# Patient Record
Sex: Male | Born: 1945
Health system: Southern US, Community
[De-identification: ages and names within clinical notes are randomized; demographics above are authoritative.]

## PROBLEM LIST (undated history)

## (undated) DIAGNOSIS — E079 Disorder of thyroid, unspecified: Secondary | ICD-10-CM

## (undated) DIAGNOSIS — E039 Hypothyroidism, unspecified: Secondary | ICD-10-CM

## (undated) DIAGNOSIS — M1990 Unspecified osteoarthritis, unspecified site: Secondary | ICD-10-CM

## (undated) DIAGNOSIS — M419 Scoliosis, unspecified: Secondary | ICD-10-CM

## (undated) DIAGNOSIS — C801 Malignant (primary) neoplasm, unspecified: Secondary | ICD-10-CM

## (undated) DIAGNOSIS — E785 Hyperlipidemia, unspecified: Secondary | ICD-10-CM

## (undated) DIAGNOSIS — K5792 Diverticulitis of intestine, part unspecified, without perforation or abscess without bleeding: Secondary | ICD-10-CM

## (undated) DIAGNOSIS — F32A Depression, unspecified: Secondary | ICD-10-CM

## (undated) DIAGNOSIS — I10 Essential (primary) hypertension: Secondary | ICD-10-CM

## (undated) HISTORY — DX: Malignant (primary) neoplasm, unspecified: C80.1

## (undated) HISTORY — PX: APPENDECTOMY: SHX54

## (undated) HISTORY — PX: PAROTID GLAND TUMOR EXCISION: SHX5221

## (undated) HISTORY — PX: SALIVARY GLAND SURGERY: SHX768

## (undated) HISTORY — PX: SKIN CANCER EXCISION: SHX779

## (undated) HISTORY — PX: ROOT CANAL: SHX2363

## (undated) HISTORY — PX: COLONOSCOPY: SHX174

## (undated) HISTORY — PX: CATARACT EXTRACTION W/ INTRAOCULAR LENS IMPLANT: SHX1309

## (undated) HISTORY — PX: HERNIA REPAIR: SHX51

## (undated) HISTORY — PX: KNEE SURGERY: SHX244

## (undated) HISTORY — PX: KNEE ARTHROSCOPY: SUR90

## (undated) HISTORY — PX: EYE SURGERY: SHX253

## (undated) HISTORY — PX: ROTATOR CUFF REPAIR: SHX139

---

## 1998-06-29 ENCOUNTER — Ambulatory Visit (HOSPITAL_BASED_OUTPATIENT_CLINIC_OR_DEPARTMENT_OTHER): Admission: RE | Admit: 1998-06-29 | Discharge: 1998-06-29 | Payer: Self-pay | Admitting: Orthopedic Surgery

## 2002-10-05 ENCOUNTER — Ambulatory Visit (HOSPITAL_COMMUNITY): Admission: RE | Admit: 2002-10-05 | Discharge: 2002-10-05 | Payer: Self-pay | Admitting: Gastroenterology

## 2004-01-17 ENCOUNTER — Ambulatory Visit (HOSPITAL_COMMUNITY): Admission: RE | Admit: 2004-01-17 | Discharge: 2004-01-17 | Payer: Self-pay | Admitting: General Surgery

## 2004-01-17 ENCOUNTER — Encounter (INDEPENDENT_AMBULATORY_CARE_PROVIDER_SITE_OTHER): Payer: Self-pay | Admitting: General Surgery

## 2004-01-17 ENCOUNTER — Ambulatory Visit (HOSPITAL_BASED_OUTPATIENT_CLINIC_OR_DEPARTMENT_OTHER): Admission: RE | Admit: 2004-01-17 | Discharge: 2004-01-17 | Payer: Self-pay | Admitting: General Surgery

## 2004-07-09 ENCOUNTER — Ambulatory Visit (HOSPITAL_COMMUNITY): Admission: RE | Admit: 2004-07-09 | Discharge: 2004-07-09 | Payer: Self-pay | Admitting: Cardiology

## 2004-07-10 ENCOUNTER — Ambulatory Visit (HOSPITAL_COMMUNITY): Admission: RE | Admit: 2004-07-10 | Discharge: 2004-07-10 | Payer: Self-pay | Admitting: Cardiology

## 2004-11-09 ENCOUNTER — Encounter: Admission: RE | Admit: 2004-11-09 | Discharge: 2004-11-09 | Payer: Self-pay | Admitting: Family Medicine

## 2005-08-22 ENCOUNTER — Encounter: Admission: RE | Admit: 2005-08-22 | Discharge: 2005-08-22 | Payer: Self-pay | Admitting: Gastroenterology

## 2007-12-19 ENCOUNTER — Encounter: Admission: RE | Admit: 2007-12-19 | Discharge: 2007-12-19 | Payer: Self-pay | Admitting: Neurosurgery

## 2008-10-14 ENCOUNTER — Encounter: Admission: RE | Admit: 2008-10-14 | Discharge: 2008-10-14 | Payer: Self-pay | Admitting: Internal Medicine

## 2010-07-23 ENCOUNTER — Other Ambulatory Visit: Payer: Self-pay | Admitting: Internal Medicine

## 2010-07-23 DIAGNOSIS — R945 Abnormal results of liver function studies: Secondary | ICD-10-CM

## 2010-07-30 ENCOUNTER — Ambulatory Visit
Admission: RE | Admit: 2010-07-30 | Discharge: 2010-07-30 | Disposition: A | Discharge status: Home / Self Care | Payer: Medicare Other | Source: Ambulatory Visit | Attending: Internal Medicine | Admitting: Internal Medicine

## 2010-07-30 DIAGNOSIS — R945 Abnormal results of liver function studies: Secondary | ICD-10-CM

## 2010-08-02 ENCOUNTER — Other Ambulatory Visit: Payer: Self-pay | Admitting: Dermatology

## 2010-10-26 NOTE — Op Note (Signed)
NAME:  Jake Collins, Jake Collins                         ACCOUNT NO.:  [id number]   MEDICAL RECORD NO.:  [id number]                   PATIENT TYPE:  AMB   LOCATION:  DSC                                  FACILITY:  [facility]   PHYSICIAN:  Adolph Pollack, M.D.            DATE OF BIRTH:  08/19/1945   DATE OF PROCEDURE:  01/17/2004  DATE OF DISCHARGE:                                 OPERATIVE REPORT   PREOPERATIVE DIAGNOSIS:  Thin melanoma of the back.   POSTOPERATIVE DIAGNOSIS:  Thin melanoma of the back.   PROCEDURE:  Wide local excision of melanoma of the back.   SURGEON:  Adolph Pollack, M.D.   ANESTHESIA:  Mixture of lidocaine and Marcaine.   INDICATIONS FOR PROCEDURE:  Mr. Sagona is a 65 year old male who had a  number of moles, one was on his back and was a thin melanoma.  He now  presents for wide local excision of that area.  The procedure and risks were  discussed with him preoperatively.   SURGICAL TECHNIQUE:  He was placed prone on the minor room procedure table.  The scar from his biopsy site was identified.  1 cm was marked from the  radial edges of the scar.  The hair was shaved and the area was sterilely  prepped and draped.  Local anesthetic was infiltrated around the area  superficially and deep and then an elliptical incision was made through the  full thickness skin into the subcutaneous tissue down to the fascial layer  and the area was excised.  It was sent to pathology.  Bleeding points were  controlled with electrocautery.  Once hemostasis was adequate, I then closed  the wound primarily with full thickness interrupted 3-0 nylon sutures.  Antibiotic ointment and a sterile dressing were applied.  He tolerated the  procedure well without any apparent complication and was sent home in  satisfactory condition.  He was given some Vicodin for pain should he need  it and told to call the office and get an appointment to see me in two  weeks.                        Adolph Pollack, M.D.    Kari Baars  D:  01/17/2004  T:  01/17/2004  Job:  [id number]

## 2011-05-28 ENCOUNTER — Emergency Department (HOSPITAL_COMMUNITY): Payer: Medicare Other | Admitting: Anesthesiology

## 2011-05-28 ENCOUNTER — Other Ambulatory Visit (INDEPENDENT_AMBULATORY_CARE_PROVIDER_SITE_OTHER): Payer: Self-pay | Admitting: General Surgery

## 2011-05-28 ENCOUNTER — Encounter: Payer: Self-pay | Admitting: Emergency Medicine

## 2011-05-28 ENCOUNTER — Encounter (HOSPITAL_COMMUNITY): Payer: Self-pay | Admitting: Anesthesiology

## 2011-05-28 ENCOUNTER — Encounter (HOSPITAL_COMMUNITY): Admission: EM | Disposition: A | Discharge status: Home / Self Care | Payer: Self-pay | Source: Home / Self Care

## 2011-05-28 ENCOUNTER — Emergency Department (INDEPENDENT_AMBULATORY_CARE_PROVIDER_SITE_OTHER): Payer: Medicare Other

## 2011-05-28 ENCOUNTER — Inpatient Hospital Stay (HOSPITAL_BASED_OUTPATIENT_CLINIC_OR_DEPARTMENT_OTHER)
Admission: EM | Admit: 2011-05-28 | Discharge: 2011-06-02 | DRG: 339 | Disposition: A | Discharge status: Home / Self Care | Payer: Medicare Other | Attending: General Surgery | Admitting: General Surgery

## 2011-05-28 DIAGNOSIS — E039 Hypothyroidism, unspecified: Secondary | ICD-10-CM

## 2011-05-28 DIAGNOSIS — K35209 Acute appendicitis with generalized peritonitis, without abscess, unspecified as to perforation: Principal | ICD-10-CM | POA: Diagnosis present

## 2011-05-28 DIAGNOSIS — I10 Essential (primary) hypertension: Secondary | ICD-10-CM | POA: Diagnosis present

## 2011-05-28 DIAGNOSIS — F3289 Other specified depressive episodes: Secondary | ICD-10-CM | POA: Diagnosis present

## 2011-05-28 DIAGNOSIS — K560 Paralytic ileus: Secondary | ICD-10-CM | POA: Diagnosis not present

## 2011-05-28 DIAGNOSIS — R112 Nausea with vomiting, unspecified: Secondary | ICD-10-CM

## 2011-05-28 DIAGNOSIS — K3580 Unspecified acute appendicitis: Secondary | ICD-10-CM

## 2011-05-28 DIAGNOSIS — E785 Hyperlipidemia, unspecified: Secondary | ICD-10-CM

## 2011-05-28 DIAGNOSIS — H409 Unspecified glaucoma: Secondary | ICD-10-CM

## 2011-05-28 DIAGNOSIS — K5730 Diverticulosis of large intestine without perforation or abscess without bleeding: Secondary | ICD-10-CM | POA: Diagnosis present

## 2011-05-28 DIAGNOSIS — K352 Acute appendicitis with generalized peritonitis, without abscess: Principal | ICD-10-CM | POA: Diagnosis present

## 2011-05-28 DIAGNOSIS — R109 Unspecified abdominal pain: Secondary | ICD-10-CM

## 2011-05-28 DIAGNOSIS — R509 Fever, unspecified: Secondary | ICD-10-CM

## 2011-05-28 DIAGNOSIS — F329 Major depressive disorder, single episode, unspecified: Secondary | ICD-10-CM | POA: Diagnosis present

## 2011-05-28 DIAGNOSIS — F32A Depression, unspecified: Secondary | ICD-10-CM

## 2011-05-28 HISTORY — DX: Disorder of thyroid, unspecified: E07.9

## 2011-05-28 HISTORY — DX: Essential (primary) hypertension: I10

## 2011-05-28 HISTORY — DX: Hypothyroidism, unspecified: E03.9

## 2011-05-28 HISTORY — DX: Diverticulitis of intestine, part unspecified, without perforation or abscess without bleeding: K57.92

## 2011-05-28 HISTORY — PX: LAPAROSCOPIC APPENDECTOMY: SHX408

## 2011-05-28 HISTORY — DX: Hyperlipidemia, unspecified: E78.5

## 2011-05-28 LAB — CBC
HCT: 45.7 % (ref 39.0–52.0)
Hemoglobin: 15.8 g/dL (ref 13.0–17.0)
MCH: 30.6 pg (ref 26.0–34.0)
MCHC: 34.6 g/dL (ref 30.0–36.0)
MCV: 88.4 fL (ref 78.0–100.0)
RDW: 13.1 % (ref 11.5–15.5)

## 2011-05-28 LAB — DIFFERENTIAL
Basophils Absolute: 0 10*3/uL (ref 0.0–0.1)
Basophils Relative: 0 % (ref 0–1)
Eosinophils Absolute: 0.1 10*3/uL (ref 0.0–0.7)
Eosinophils Relative: 0 % (ref 0–5)
Monocytes Absolute: 0.7 10*3/uL (ref 0.1–1.0)
Monocytes Relative: 4 % (ref 3–12)
Neutro Abs: 13.5 10*3/uL — ABNORMAL HIGH (ref 1.7–7.7)

## 2011-05-28 LAB — URINALYSIS, ROUTINE W REFLEX MICROSCOPIC
Bilirubin Urine: NEGATIVE
Glucose, UA: NEGATIVE mg/dL
Ketones, ur: NEGATIVE mg/dL
Leukocytes, UA: NEGATIVE
Protein, ur: NEGATIVE mg/dL
pH: 6.5 (ref 5.0–8.0)

## 2011-05-28 LAB — BASIC METABOLIC PANEL
BUN: 19 mg/dL (ref 6–23)
Calcium: 10.2 mg/dL (ref 8.4–10.5)
Chloride: 100 mEq/L (ref 96–112)
Creatinine, Ser: 1.2 mg/dL (ref 0.50–1.35)
GFR calc Af Amer: 71 mL/min — ABNORMAL LOW (ref >90)

## 2011-05-28 LAB — URINE MICROSCOPIC-ADD ON

## 2011-05-28 SURGERY — APPENDECTOMY, LAPAROSCOPIC
Anesthesia: General | Site: Abdomen | Wound class: Dirty or Infected

## 2011-05-28 MED ORDER — PROMETHAZINE HCL 25 MG/ML IJ SOLN: 6.2500 mg | INTRAMUSCULAR | Status: DC | PRN

## 2011-05-28 MED ORDER — MORPHINE SULFATE 4 MG/ML IJ SOLN
4.0000 mg | Freq: Once | INTRAMUSCULAR | Status: AC
  Administered 2011-05-28: 4 mg via INTRAVENOUS
  Filled 2011-05-28: qty 1

## 2011-05-28 MED ORDER — MORPHINE SULFATE 2 MG/ML IJ SOLN
2.0000 mg | INTRAMUSCULAR | Status: DC | PRN
  Administered 2011-05-30 – 2011-06-01 (×4): 2 mg via INTRAVENOUS
  Filled 2011-05-28 (×5): qty 1

## 2011-05-28 MED ORDER — LACTATED RINGERS IR SOLN
Status: DC | PRN
  Administered 2011-05-28: 2000 mL

## 2011-05-28 MED ORDER — GLYCOPYRROLATE 0.2 MG/ML IJ SOLN
INTRAMUSCULAR | Status: DC | PRN
  Administered 2011-05-28: .6 mg via INTRAVENOUS

## 2011-05-28 MED ORDER — ONDANSETRON HCL 4 MG/2ML IJ SOLN
4.0000 mg | Freq: Once | INTRAMUSCULAR | Status: AC
  Administered 2011-05-28: 4 mg via INTRAVENOUS
  Filled 2011-05-28: qty 2

## 2011-05-28 MED ORDER — LACTATED RINGERS IV SOLN: INTRAVENOUS | Status: DC

## 2011-05-28 MED ORDER — BUPIVACAINE-EPINEPHRINE 0.25% -1:200000 IJ SOLN
INTRAMUSCULAR | Status: AC
  Filled 2011-05-28: qty 1

## 2011-05-28 MED ORDER — ROCURONIUM BROMIDE 100 MG/10ML IV SOLN
INTRAVENOUS | Status: DC | PRN
  Administered 2011-05-28: 28 mg via INTRAVENOUS
  Administered 2011-05-28: 2 mg via INTRAVENOUS
  Administered 2011-05-28 (×2): 10 mg via INTRAVENOUS

## 2011-05-28 MED ORDER — FENTANYL CITRATE 0.05 MG/ML IJ SOLN
INTRAMUSCULAR | Status: DC | PRN
  Administered 2011-05-28 (×2): 100 ug via INTRAVENOUS
  Administered 2011-05-28: 50 ug via INTRAVENOUS
  Administered 2011-05-28: 100 ug via INTRAVENOUS

## 2011-05-28 MED ORDER — SODIUM CHLORIDE 0.9 % IR SOLN
Status: DC | PRN
  Administered 2011-05-28: 1000 mL

## 2011-05-28 MED ORDER — IOHEXOL 300 MG/ML  SOLN
100.0000 mL | Freq: Once | INTRAMUSCULAR | Status: AC | PRN
  Administered 2011-05-28: 100 mL via INTRAVENOUS

## 2011-05-28 MED ORDER — KCL IN DEXTROSE-NACL 20-5-0.9 MEQ/L-%-% IV SOLN
INTRAVENOUS | Status: DC
  Administered 2011-05-29 – 2011-06-01 (×8): via INTRAVENOUS
  Filled 2011-05-28 (×14): qty 1000

## 2011-05-28 MED ORDER — LIDOCAINE HCL (CARDIAC) 20 MG/ML IV SOLN
INTRAVENOUS | Status: DC | PRN
  Administered 2011-05-28: 80 mg via INTRAVENOUS

## 2011-05-28 MED ORDER — PANTOPRAZOLE SODIUM 40 MG IV SOLR
40.0000 mg | Freq: Every day | INTRAVENOUS | Status: DC
  Administered 2011-05-29 – 2011-05-30 (×3): 40 mg via INTRAVENOUS
  Filled 2011-05-28 (×4): qty 40

## 2011-05-28 MED ORDER — ONDANSETRON HCL 4 MG/2ML IJ SOLN: 4.0000 mg | Freq: Four times a day (QID) | INTRAMUSCULAR | Status: DC | PRN

## 2011-05-28 MED ORDER — SODIUM CHLORIDE 0.9 % IV SOLN
1.0000 g | INTRAVENOUS | Status: DC
  Administered 2011-05-29 – 2011-05-31 (×3): 1 g via INTRAVENOUS
  Filled 2011-05-28 (×4): qty 1

## 2011-05-28 MED ORDER — PROPOFOL 10 MG/ML IV BOLUS
INTRAVENOUS | Status: DC | PRN
  Administered 2011-05-28: 160 mg via INTRAVENOUS

## 2011-05-28 MED ORDER — ONDANSETRON HCL 4 MG PO TABS: 4.0000 mg | ORAL_TABLET | Freq: Four times a day (QID) | ORAL | Status: DC | PRN

## 2011-05-28 MED ORDER — HEPARIN SODIUM (PORCINE) 5000 UNIT/ML IJ SOLN
5000.0000 [IU] | Freq: Three times a day (TID) | INTRAMUSCULAR | Status: DC
  Administered 2011-05-29 – 2011-06-02 (×13): 5000 [IU] via SUBCUTANEOUS
  Filled 2011-05-28 (×16): qty 1

## 2011-05-28 MED ORDER — FENTANYL CITRATE 0.05 MG/ML IJ SOLN: 25.0000 ug | INTRAMUSCULAR | Status: DC | PRN

## 2011-05-28 MED ORDER — ONDANSETRON HCL 4 MG/2ML IJ SOLN
INTRAMUSCULAR | Status: DC | PRN
  Administered 2011-05-28: 4 mg via INTRAVENOUS

## 2011-05-28 MED ORDER — SODIUM CHLORIDE 0.9 % IV SOLN
1.0000 g | Freq: Once | INTRAVENOUS | Status: AC
  Administered 2011-05-28: 1 g via INTRAVENOUS
  Filled 2011-05-28: qty 1

## 2011-05-28 MED ORDER — SUCCINYLCHOLINE CHLORIDE 20 MG/ML IJ SOLN
INTRAMUSCULAR | Status: DC | PRN
  Administered 2011-05-28: 100 mg via INTRAVENOUS

## 2011-05-28 MED ORDER — BUPIVACAINE-EPINEPHRINE 0.5% -1:200000 IJ SOLN
INTRAMUSCULAR | Status: DC | PRN
  Administered 2011-05-28: 20 mL

## 2011-05-28 MED ORDER — NEOSTIGMINE METHYLSULFATE 1 MG/ML IJ SOLN
INTRAMUSCULAR | Status: DC | PRN
  Administered 2011-05-28: 4 mg via INTRAVENOUS

## 2011-05-28 MED ORDER — LEVOTHYROXINE SODIUM 100 MCG IV SOLR
50.0000 ug | Freq: Every day | INTRAVENOUS | Status: DC
  Administered 2011-05-29 – 2011-05-31 (×3): 50 ug via INTRAVENOUS
  Filled 2011-05-28 (×3): qty 2.5

## 2011-05-28 MED ORDER — LACTATED RINGERS IV SOLN
INTRAVENOUS | Status: DC | PRN
  Administered 2011-05-28 (×2): via INTRAVENOUS

## 2011-05-28 MED ORDER — IOHEXOL 300 MG/ML  SOLN
18.0000 mL | Freq: Once | INTRAMUSCULAR | Status: AC | PRN
  Administered 2011-05-28: 18 mL via ORAL

## 2011-05-28 SURGICAL SUPPLY — 50 items
ADH SKN CLS APL DERMABOND .7 (GAUZE/BANDAGES/DRESSINGS)
APPLIER CLIP 5 13 M/L LIGAMAX5 (MISCELLANEOUS)
APPLIER CLIP ROT 10 11.4 M/L (STAPLE)
APR CLP MED LRG 11.4X10 (STAPLE)
APR CLP MED LRG 5 ANG JAW (MISCELLANEOUS)
BAG SPEC RTRVL LRG 6X4 10 (ENDOMECHANICALS) ×1
CANISTER SUCTION 2500CC (MISCELLANEOUS) ×2 IMPLANT
CHLORAPREP W/TINT 26ML (MISCELLANEOUS) ×2 IMPLANT
CLIP APPLIE 5 13 M/L LIGAMAX5 (MISCELLANEOUS) IMPLANT
CLIP APPLIE ROT 10 11.4 M/L (STAPLE) ×1 IMPLANT
CLOTH BEACON ORANGE TIMEOUT ST (SAFETY) ×2 IMPLANT
CUTTER FLEX LINEAR 45M (STAPLE) ×1 IMPLANT
DECANTER SPIKE VIAL GLASS SM (MISCELLANEOUS) ×2 IMPLANT
DERMABOND ADVANCED (GAUZE/BANDAGES/DRESSINGS)
DERMABOND ADVANCED .7 DNX12 (GAUZE/BANDAGES/DRESSINGS) IMPLANT
DRAPE LAPAROSCOPIC ABDOMINAL (DRAPES) ×2 IMPLANT
ELECT REM PT RETURN 9FT ADLT (ELECTROSURGICAL) ×2
ELECTRODE REM PT RTRN 9FT ADLT (ELECTROSURGICAL) ×1 IMPLANT
GLOVE BIOGEL PI IND STRL 7.0 (GLOVE) ×1 IMPLANT
GLOVE BIOGEL PI INDICATOR 7.0 (GLOVE)
GLOVE SS BIOGEL STRL SZ 7.5 (GLOVE) ×1 IMPLANT
GLOVE SUPERSENSE BIOGEL SZ 7.5 (GLOVE) ×1
GLOVE SURG SS PI 6.5 STRL IVOR (GLOVE) ×1 IMPLANT
GLOVE SURG SS PI 8.0 STRL IVOR (GLOVE) ×1 IMPLANT
GLOVE SURG SS PI 8.5 STRL IVOR (GLOVE) ×1
GLOVE SURG SS PI 8.5 STRL STRW (GLOVE) IMPLANT
GOWN STRL NON-REIN LRG LVL3 (GOWN DISPOSABLE) ×2 IMPLANT
GOWN STRL REIN XL XLG (GOWN DISPOSABLE) ×4 IMPLANT
IV LACTATED RINGERS 1000ML (IV SOLUTION) ×2 IMPLANT
KIT BASIN OR (CUSTOM PROCEDURE TRAY) ×2 IMPLANT
NS IRRIG 1000ML POUR BTL (IV SOLUTION) ×2 IMPLANT
PENCIL BUTTON HOLSTER BLD 10FT (ELECTRODE) ×1 IMPLANT
POUCH SPECIMEN RETRIEVAL 10MM (ENDOMECHANICALS) ×2 IMPLANT
RELOAD 45 VASCULAR/THIN (ENDOMECHANICALS) IMPLANT
RELOAD STAPLE 45 2.5 WHT GRN (ENDOMECHANICALS) IMPLANT
RELOAD STAPLE 45 3.5 BLU ETS (ENDOMECHANICALS) IMPLANT
RELOAD STAPLE TA45 3.5 REG BLU (ENDOMECHANICALS) ×2 IMPLANT
SCALPEL HARMONIC ACE (MISCELLANEOUS) ×2 IMPLANT
SET IRRIG TUBING LAPAROSCOPIC (IRRIGATION / IRRIGATOR) ×2 IMPLANT
SLEEVE Z-THREAD 5X100MM (TROCAR) IMPLANT
SOLUTION ANTI FOG 6CC (MISCELLANEOUS) ×2 IMPLANT
STRIP CLOSURE SKIN 1/2X4 (GAUZE/BANDAGES/DRESSINGS) ×1 IMPLANT
SUT MNCRL AB 4-0 PS2 18 (SUTURE) ×2 IMPLANT
TOWEL OR 17X26 10 PK STRL BLUE (TOWEL DISPOSABLE) ×2 IMPLANT
TRAY FOLEY CATH 14FRSI W/METER (CATHETERS) ×2 IMPLANT
TRAY LAP CHOLE (CUSTOM PROCEDURE TRAY) ×2 IMPLANT
TROCAR XCEL BLUNT TIP 100MML (ENDOMECHANICALS) ×2 IMPLANT
TROCAR Z-THREAD FIOS 12X100MM (TROCAR) ×2 IMPLANT
TROCAR Z-THREAD FIOS 5X100MM (TROCAR) ×2 IMPLANT
TUBING INSUFFLATION 10FT LAP (TUBING) ×2 IMPLANT

## 2011-05-28 NOTE — ED Notes (Signed)
Pt transported to OR

## 2011-05-28 NOTE — Transfer of Care (Signed)
Immediate Anesthesia Transfer of Care Note  Patient: Jake Collins  Procedure(s) Performed:  APPENDECTOMY LAPAROSCOPIC  Patient Location: PACU  Anesthesia Type: General  Level of Consciousness: awake, alert  and oriented  Airway & Oxygen Therapy: Patient Spontanous Breathing and Patient connected to face mask oxygen  Post-op Assessment: Report given to PACU RN and Post -op Vital signs reviewed and stable  Post vital signs: Reviewed and stable  Complications: No apparent anesthesia complications

## 2011-05-28 NOTE — ED Notes (Signed)
RLQ abdominal pain since Sunday night.  Some fever, N/V, no D.  Pt sent here by MD to be evaluated.

## 2011-05-28 NOTE — Op Note (Signed)
Preoperative Diagnosis: Acute appendicitis  Postoprative Diagnosis: acute appendicitis with gangrene and perforation  Procedure: Procedure(s): APPENDECTOMY LAPAROSCOPIC   Surgeon: Glenna Fellows T   Assistants: none  Anesthesia:  General endotracheal anesthesiaDiagnos  Indications: patient is a 65 year old male who presents with approximately 2 days of persistent and worsening right lower quadrant abdominal pain, nausea, fever. He has exquisite tenderness in the right lower quadrant and CT scan has shown evidence of acute appendicitis. I recommend proceeding with laparoscopic appendectomy and possible open appendectomy. We discussed the nature of the procedure and its indications, risks of bleeding, infection, possible open procedure, and anesthetic complications  Procedure Detail: the patient was brought to the operating room placed in the supine position and general endotracheal anesthesia induced. Foley catheter was placed. He received preoperative IV antibiotics.PAS were placed. The abdomen was widely sterilely prepped and draped.  Patient time out was performed and correct procedure verified. Access was obtained with a 1 cm infraumbilical incision with an open Hassan technique without difficulty and pneumoperitoneum established. Under direct vision a 5 mm trocar was placed in the right upper quadrant and a 12 mm trocar in the left lower quadrant. There were noted to be adherent loops of small bowel with exudate against the anterior abdominal wall. These were carefully taken down with gentle blunt dissection. There was a fairly widespread inflammatory process in the right lower quadrant. Loops of small bowel that were adherent with exudate were gently separated. The small bowel was followed up to the cecum where there was intense inflammation around the tip of the cecum. With careful blunt dissection the appendix was mobilized from just beneath the cecum and was severely acutely inflamed with  gangrenous changes the tip and probable microperforation although there was no fecal contamination. The cecum was freed from its lateral peritoneal attachments. The mesial appendix was very thick and inflamed but I was able to carefully come through the mesoappendix with the Harmonic scalpel working down toward the base of the appendix. The appendix was inflamed right down toward the tip of the cecum in the mesial appendix was completely taken off the appendix and the appendix was completely mobilized down to the tip of the cecum. This was just adjacent to the ileocecal valve but I was careful to avoid narrowing this. There was a relatively normal appendix right at the tip of the cecum and I came across the tip of the cecum with a 45 mm blue load stapler. The specimen was placed in an Endo Catch bag and brought out through the umbilical incision. The abdomen was then thoroughly irrigated with several liters of saline and all interloop inflammatory adhesions completely broken up.  The staple line was intact. There was no bleeding. All CO2 was evacuated. The mattress suture was secured at the umbilical incision. The skin incisions were closed with subcutaneous 2-0 Monocryl and Dermabond. The patient was taken to recovery room in good condition. Sponge needle and instrument counts were correct.   Findings: Severe gangrenous appendicitis with probable microperforation and peritonitis in the right lower quadrant.  Estimated Blood Loss:  Minimal         Drains: none  Blood Given: none          Specimens: appendix        Complications:  * No complications entered in OR log *         Disposition: PACU - hemodynamically stable.         Condition: stable  Mariella Saa MD, FACS  05/28/2011, 10:46 PM

## 2011-05-28 NOTE — ED Notes (Signed)
MD at bedside. Surgeon at bedside.  

## 2011-05-28 NOTE — H&P (Signed)
Jake Collins is an 65 y.o. male.   Chief Complaint: abdominal pain HPI: patient is a 65 year old male who approximately 48 hours ago developed the fairly rapid onset of right lower quadrant abdominal pain. This was persistent and somewhat worse with movement. The following day now 24 hours ago the pain was definitely worse. He was nauseated but no vomiting. He had some diarrhea. He attempted to contact his primary but it got late in the day and he declined to go to the emergency room. The pain persisted today and he presented to the emergency room. He has localized pain in the right lower quadrant which is persistent and unchanged. He has lack of appetite today. No vomiting. Denies fever or chills. He has no history of similar problems although has been treated as an outpatient for diverticulitis in the past. His colonoscopy was done about 2 years ago.  Past Medical History  Diagnosis Date  . Thyroid disease   . Hypothyroid   . Hyperlipemia   . Hypertension   . Diverticulitis     2 bouts in 10 years    Past Surgical History  Procedure Date  . Hernia repair   . Skin cancer excision     on his back and was benign  . Salivary gland surgery     L side  . Rotator cuff repair     Left side  . Knee arthroscopy     Left    History reviewed. No pertinent family history. Social History:  reports that he has never smoked. He does not have any smokeless tobacco history on file. He reports that he does not drink alcohol. His drug history not on file.  Allergies:  Allergies  Allergen Reactions  . Sulfa Antibiotics     Trudie Buckler syndrome     Medications Prior to Admission  Medication Dose Route Frequency Provider Last Rate Last Dose  . ertapenem (INVANZ) 1 g in sodium chloride 0.9 % 50 mL IVPB  1 g Intravenous Once Geoffery Lyons, MD   1 g at 05/28/11 1835  . iohexol (OMNIPAQUE) 300 MG/ML solution 100 mL  100 mL Intravenous Once PRN Medication Radiologist   100 mL at 05/28/11 1810  .  iohexol (OMNIPAQUE) 300 MG/ML solution 18 mL  18 mL Oral Once PRN Medication Radiologist   18 mL at 05/28/11 1715  . morphine 4 MG/ML injection 4 mg  4 mg Intravenous Once Geoffery Lyons, MD   4 mg at 05/28/11 1717  . morphine 4 MG/ML injection 4 mg  4 mg Intravenous Once Geoffery Lyons, MD   4 mg at 05/28/11 1842  . ondansetron (ZOFRAN) injection 4 mg  4 mg Intravenous Once Geoffery Lyons, MD   4 mg at 05/28/11 1749   No current outpatient prescriptions on file as of 05/28/2011.    Results for orders placed during the hospital encounter of 05/28/11 (from the past 48 hour(s))  URINALYSIS, ROUTINE W REFLEX MICROSCOPIC     Status: Abnormal   Collection Time   05/28/11  4:35 PM      Component Value Range Comment   Color, Urine YELLOW  YELLOW     APPearance CLEAR  CLEAR     Specific Gravity, Urine 1.014  1.005 - 1.030     pH 6.5  5.0 - 8.0     Glucose, UA NEGATIVE  NEGATIVE (mg/dL)    Hgb urine dipstick SMALL (*) NEGATIVE     Bilirubin Urine NEGATIVE  NEGATIVE  Ketones, ur NEGATIVE  NEGATIVE (mg/dL)    Protein, ur NEGATIVE  NEGATIVE (mg/dL)    Urobilinogen, UA 1.0  0.0 - 1.0 (mg/dL)    Nitrite NEGATIVE  NEGATIVE     Leukocytes, UA NEGATIVE  NEGATIVE    URINE MICROSCOPIC-ADD ON     Status: Normal   Collection Time   05/28/11  4:35 PM      Component Value Range Comment   Squamous Epithelial / LPF RARE  RARE     RBC / HPF 3-6  <3 (RBC/hpf)    Bacteria, UA RARE  RARE    CBC     Status: Abnormal   Collection Time   05/28/11  4:50 PM      Component Value Range Comment   WBC 15.6 (*) 4.0 - 10.5 (K/uL)    RBC 5.17  4.22 - 5.81 (MIL/uL)    Hemoglobin 15.8  13.0 - 17.0 (g/dL)    HCT 16.1  09.6 - 04.5 (%)    MCV 88.4  78.0 - 100.0 (fL)    MCH 30.6  26.0 - 34.0 (pg)    MCHC 34.6  30.0 - 36.0 (g/dL)    RDW 40.9  81.1 - 91.4 (%)    Platelets 189  150 - 400 (K/uL)   DIFFERENTIAL     Status: Abnormal   Collection Time   05/28/11  4:50 PM      Component Value Range Comment   Neutrophils  Relative 86 (*) 43 - 77 (%)    Neutro Abs 13.5 (*) 1.7 - 7.7 (K/uL)    Lymphocytes Relative 9 (*) 12 - 46 (%)    Lymphs Abs 1.4  0.7 - 4.0 (K/uL)    Monocytes Relative 4  3 - 12 (%)    Monocytes Absolute 0.7  0.1 - 1.0 (K/uL)    Eosinophils Relative 0  0 - 5 (%)    Eosinophils Absolute 0.1  0.0 - 0.7 (K/uL)    Basophils Relative 0  0 - 1 (%)    Basophils Absolute 0.0  0.0 - 0.1 (K/uL)   BASIC METABOLIC PANEL     Status: Abnormal   Collection Time   05/28/11  4:50 PM      Component Value Range Comment   Sodium 140  135 - 145 (mEq/L)    Potassium 3.7  3.5 - 5.1 (mEq/L)    Chloride 100  96 - 112 (mEq/L)    CO2 29  19 - 32 (mEq/L)    Glucose, Bld 109 (*) 70 - 99 (mg/dL)    BUN 19  6 - 23 (mg/dL)    Creatinine, Ser 7.82  0.50 - 1.35 (mg/dL)    Calcium 95.6  8.4 - 10.5 (mg/dL)    GFR calc non Af Amer 61 (*) >90 (mL/min)    GFR calc Af Amer 71 (*) >90 (mL/min)    Ct Abdomen Pelvis W Contrast  05/28/2011  *RADIOLOGY REPORT*  Clinical Data: Abdominal pain, fever, nausea  CT ABDOMEN AND PELVIS WITH CONTRAST  Technique:  Multidetector CT imaging of the abdomen and pelvis was performed following the standard protocol during bolus administration of intravenous contrast.  Contrast: 18mL OMNIPAQUE IOHEXOL 300 MG/ML IV SOLN, OMNIPAQUE IOHEXOL 300 MG/ML IV SOLN  Comparison: None.  Findings: Mild dependent atelectasis in the bilateral lung bases.  Liver, spleen, pancreas, and adrenal glands are within normal limits.  Gallbladder is unremarkable.  No intrahepatic or extrahepatic ductal dilatation.  Kidneys are unremarkable.  No hydronephrosis.  No evidence of bowel obstruction. Colonic diverticulosis, without associated inflammatory changes.  Inflammatory changes/mucosal hyperenhancement involving the appendix (series 2/image 56), compatible with acute appendicitis. No drainable fluid collection or abscess.  No free air.  Atherosclerotic calcifications of the abdominal aorta and branch vessels.  No  abdominopelvic ascites.  No suspicious abdominopelvic lymphadenopathy.  Prostate is unremarkable.  Bladder is within normal limits.  Bilateral fat containing inguinal hernias.  Degenerative changes of the visualized thoracolumbar spine.  IMPRESSION: Acute appendicitis.  No drainable fluid collection or abscess.  No free air.  Critical Value/emergent results were called by telephone at the time of interpretation on 05/28/2011  at 1815 hours  to  Dr. Geoffery Lyons, who verbally acknowledged these results.  Original Report Authenticated By: Charline Bills, M.D.    Review of Systems  Constitutional: Negative for fever, chills and weight loss.  HENT: Negative.   Respiratory: Negative for cough and shortness of breath.   Cardiovascular: Negative for chest pain and palpitations.  Gastrointestinal: Positive for nausea and abdominal pain. Negative for vomiting, diarrhea, constipation, blood in stool and melena.  Genitourinary: Negative.   Skin: Negative for rash.    Blood pressure 126/66, pulse 89, temperature 98 F (36.7 C), temperature source Oral, resp. rate 16, SpO2 96.00%. Physical Exam  General: Alert and well-developed white male in no distress Skin: Warm and dry without rash or infection HEENT: There is a well-healed incision over the left parotid. No palpable masses. Oropharynx clear. Sclera nonicteric. Pupils equal round and reactive. Lymph nodes: No cervical, supraclavicular, or inguinal nodes palpable Cardiovascular: Regular rate and rhythm without murmur. No JVD or edema. Lungs: Clear without wheezing or increased work of breathing Abdomen: Nondistended. Hypoactive bowel sounds. There is marked tenderness in the right lower quadrant with guarding and local peritoneal signs. No discernible masses or organomegaly. The remainder of the abdomen is mildly tender and there is referred tenderness. Extremities: No joint swelling no edema or cyanosis Neurologic: He is alert and fully oriented.  Affect normal. Motor and sensory exams grossly normal. Assessment/Plan Acute appendicitis. His clinical presentation is typical in this has been confirmed by CT scan. He has already received broad-spectrum IV antibiotics. The patient is to be taken to the operating room for emergency appendectomy. He'll be approached laparoscopically. We discussed the nature of the procedure, indications, and risks of anesthetic complications, heart or lung problems, bleeding, infection, and possible need for open procedure. He understands and agrees.  Mackinley Cassaday T 05/28/2011, 8:18 PM

## 2011-05-28 NOTE — Anesthesia Postprocedure Evaluation (Signed)
Anesthesia Post Note  Patient: Jake Collins  Procedure(s) Performed:  APPENDECTOMY LAPAROSCOPIC  Anesthesia type: General  Patient location: PACU  Post pain: Pain level controlled  Post assessment: Post-op Vital signs reviewed  Last Vitals:  Filed Vitals:   05/28/11 2245  BP: 134/67  Pulse:   Temp:   Resp:     Post vital signs: Reviewed  Level of consciousness: sedated  Complications: No apparent anesthesia complications

## 2011-05-28 NOTE — Anesthesia Preprocedure Evaluation (Signed)
Anesthesia Evaluation  Patient identified by MRN, date of birth, ID band Patient awake    Reviewed: Allergy & Precautions, H&P , NPO status , Patient's Chart, lab work & pertinent test results  Airway Mallampati: II TM Distance: >3 FB Neck ROM: full    Dental No notable dental hx. (+) Teeth Intact and Dental Advidsory Given   Pulmonary neg pulmonary ROS,  clear to auscultation  Pulmonary exam normal       Cardiovascular Exercise Tolerance: Good hypertension, On Medications regular Normal    Neuro/Psych Negative Neurological ROS  Negative Psych ROS   GI/Hepatic negative GI ROS, Neg liver ROS,   Endo/Other  Negative Endocrine ROSHypothyroid  Renal/GU negative Renal ROS  Genitourinary negative   Musculoskeletal   Abdominal   Peds  Hematology negative hematology ROS (+)   Anesthesia Other Findings   Reproductive/Obstetrics negative OB ROS                           Anesthesia Physical Anesthesia Plan  ASA: II and Emergent  Anesthesia Plan: General and Rapid Sequence   Post-op Pain Management:    Induction:   Airway Management Planned:   Additional Equipment:   Intra-op Plan:   Post-operative Plan:   Informed Consent: I have reviewed the patients History and Physical, chart, labs and discussed the procedure including the risks, benefits and alternatives for the proposed anesthesia with the patient or authorized representative who has indicated his/her understanding and acceptance.   Dental Advisory Given  Plan Discussed with: CRNA  Anesthesia Plan Comments:         Anesthesia Quick Evaluation

## 2011-05-28 NOTE — ED Notes (Signed)
Last Morphine 4mg  @ 1842. Invanz 1gm @ [id number].

## 2011-05-28 NOTE — ED Notes (Signed)
Via carelink--spoke with Terri for general surgery at [facility]

## 2011-05-28 NOTE — ED Provider Notes (Addendum)
History     CSN: [id number] Arrival date & time: 05/28/2011  4:30 PM   First MD Initiated Contact with Patient 05/28/11 1655      Chief Complaint  Patient presents with  . Abdominal Pain  . Fever  . Nausea  . Emesis    (Consider location/radiation/quality/duration/timing/severity/associated sxs/prior treatment) HPI Comments: Started Sunday night with pain in rlq, worse the past two days.  No urinary or bowel complaints.    Patient is a 65 y.o. male presenting with abdominal pain, fever, and vomiting. The history is provided by the patient.  Abdominal Pain The primary symptoms of the illness include abdominal pain, fever and vomiting. The current episode started yesterday. The onset of the illness was gradual. The problem has been gradually worsening.  The patient has not had a change in bowel habit. Additional symptoms associated with the illness include anorexia. Symptoms associated with the illness do not include chills, diaphoresis, constipation, urgency, hematuria, frequency or back pain.  Fever Primary symptoms of the febrile illness include fever, abdominal pain and vomiting.  Emesis  Associated symptoms include abdominal pain and a fever. Pertinent negatives include no chills.    Past Medical History  Diagnosis Date  . Thyroid disease   . Hypothyroid   . Hyperlipemia   . Hypertension   . Diverticulitis     2 bouts in 10 years    Past Surgical History  Procedure Date  . Hernia repair   . Skin cancer excision     on his back and was benign  . Salivary gland surgery     L side  . Rotator cuff repair     Left side  . Knee arthroscopy     Left    History reviewed. No pertinent family history.  History  Substance Use Topics  . Smoking status: Never Smoker   . Smokeless tobacco: Not on file  . Alcohol Use: No      Review of Systems  Constitutional: Positive for fever. Negative for chills and diaphoresis.  Gastrointestinal: Positive for vomiting,  abdominal pain and anorexia. Negative for constipation.  Genitourinary: Negative for urgency, frequency and hematuria.  Musculoskeletal: Negative for back pain.  All other systems reviewed and are negative.    Allergies  Sulfa antibiotics  Home Medications   Current Outpatient Rx  Name Route Sig Dispense Refill  . ACETAMINOPHEN 500 MG PO TABS Oral Take 1,000 mg by mouth every 6 (six) hours as needed. For pain     . ASPIRIN EC 81 MG PO TBEC Oral Take 81 mg by mouth daily.      Marland Kitchen BISOPROLOL-HYDROCHLOROTHIAZIDE 2.5-6.25 MG PO TABS Oral Take 1 tablet by mouth daily.      Marland Kitchen COENZYME Q10 200 MG PO CAPS Oral Take 200 mg by mouth daily.      . OMEGA-3 FATTY ACIDS 1000 MG PO CAPS Oral Take 1 g by mouth daily.      Marland Kitchen GLUCOSAMINE-CHONDROITIN 500-400 MG PO TABS Oral Take 1-2 tablets by mouth daily.      Marland Kitchen LEVOTHYROXINE SODIUM 100 MCG PO TABS Oral Take 100 mcg by mouth daily.      Marland Kitchen MAGNESIUM OXIDE 400 MG PO TABS Oral Take 400 mg by mouth daily.      Marland Kitchen OMEPRAZOLE 40 MG PO CPDR Oral Take 40 mg by mouth daily.      Marland Kitchen ROSUVASTATIN CALCIUM 10 MG PO TABS Oral Take 10 mg by mouth daily.      . VENLAFAXINE  HCL 50 MG PO TABS Oral Take 50 mg by mouth daily.        BP 135/76  Pulse 81  Temp(Src) 97.9 F (36.6 C) (Oral)  Resp 16  SpO2 98%  Physical Exam  Nursing note and vitals reviewed. Constitutional: He is oriented to person, place, and time. He appears well-developed and well-nourished.  HENT:  Head: Normocephalic and atraumatic.  Neck: Normal range of motion. Neck supple.  Cardiovascular: Normal rate and regular rhythm.   No murmur heard. Pulmonary/Chest: Effort normal and breath sounds normal. No respiratory distress.  Abdominal: Soft. Bowel sounds are normal.       TTP in the rlq.  No rebound or guarding.    Musculoskeletal: Normal range of motion. He exhibits no edema.  Lymphadenopathy:    He has no cervical adenopathy.  Neurological: He is alert and oriented to person, place, and  time.  Skin: Skin is warm and dry.    ED Course  Procedures (including critical care time)  Labs Reviewed  URINALYSIS, ROUTINE W REFLEX MICROSCOPIC - Abnormal; Notable for the following:    Hgb urine dipstick SMALL (*)    All other components within normal limits  CBC - Abnormal; Notable for the following:    WBC 15.6 (*)    All other components within normal limits  DIFFERENTIAL - Abnormal; Notable for the following:    Neutrophils Relative 86 (*)    Neutro Abs 13.5 (*)    Lymphocytes Relative 9 (*)    All other components within normal limits  URINE MICROSCOPIC-ADD ON  BASIC METABOLIC PANEL   No results found.   No diagnosis found.    MDM  WBC 15.6 with study positive for acute appendicitis.  Will consult general surgery at [facility] for admission/appendectomy.  Will give invanz iv.          Geoffery Lyons, MD 05/28/11 2952  Geoffery Lyons, MD 05/28/11 [phone]

## 2011-05-28 NOTE — ED Notes (Signed)
Preop consent signed. Preop checklist completed.

## 2011-05-28 NOTE — ED Notes (Signed)
Abd pain with n/v for 3 days. [facility]. CT scan showed acute appy. RLQ tenderness

## 2011-05-29 DIAGNOSIS — E039 Hypothyroidism, unspecified: Secondary | ICD-10-CM

## 2011-05-29 DIAGNOSIS — F329 Major depressive disorder, single episode, unspecified: Secondary | ICD-10-CM

## 2011-05-29 DIAGNOSIS — H409 Unspecified glaucoma: Secondary | ICD-10-CM

## 2011-05-29 DIAGNOSIS — E785 Hyperlipidemia, unspecified: Secondary | ICD-10-CM

## 2011-05-29 LAB — CBC
HCT: 37.2 % — ABNORMAL LOW (ref 39.0–52.0)
MCHC: 33.3 g/dL (ref 30.0–36.0)
Platelets: 166 10*3/uL (ref 150–400)
RDW: 13.2 % (ref 11.5–15.5)
WBC: 12.7 10*3/uL — ABNORMAL HIGH (ref 4.0–10.5)

## 2011-05-29 LAB — BASIC METABOLIC PANEL
Chloride: 104 mEq/L (ref 96–112)
Creatinine, Ser: 1.11 mg/dL (ref 0.50–1.35)
GFR calc Af Amer: 77 mL/min — ABNORMAL LOW (ref >90)
GFR calc non Af Amer: 67 mL/min — ABNORMAL LOW (ref >90)

## 2011-05-29 MED ORDER — VENLAFAXINE HCL 50 MG PO TABS
50.0000 mg | ORAL_TABLET | Freq: Every day | ORAL | Status: DC
  Administered 2011-05-29 – 2011-06-02 (×5): 50 mg via ORAL
  Filled 2011-05-29 (×5): qty 1

## 2011-05-29 MED ORDER — DIPHENHYDRAMINE HCL 25 MG PO CAPS
25.0000 mg | ORAL_CAPSULE | Freq: Once | ORAL | Status: AC
  Administered 2011-05-29: 25 mg via ORAL
  Filled 2011-05-29: qty 1

## 2011-05-29 MED ORDER — LEVOTHYROXINE SODIUM 100 MCG PO TABS: 100.0000 ug | ORAL_TABLET | Freq: Every day | ORAL | Status: DC

## 2011-05-29 MED ORDER — TRAVOPROST (BAK FREE) 0.004 % OP SOLN
1.0000 [drp] | Freq: Every day | OPHTHALMIC | Status: DC
  Administered 2011-05-29 – 2011-06-01 (×4): 1 [drp] via OPHTHALMIC
  Filled 2011-05-29: qty 2.5

## 2011-05-29 MED ORDER — LACTATED RINGERS IV SOLN: INTRAVENOUS | Status: DC

## 2011-05-29 NOTE — Progress Notes (Signed)
1 Day Post-Op  Subjective: He has had pain since Sunday, up to bathroom voiding.  No flatus   Objective: Vital signs in last 24 hours: Temp:  [97.9 F (36.6 C)-98.6 F (37 C)] 98.6 F (37 C) (12/19 0535) Pulse Rate:  [81-92] 92  (12/19 0535) Resp:  [16-20] 18  (12/19 0535) BP: (113-160)/(62-80) 113/72 mmHg (12/19 0535) SpO2:  [93 %-100 %] 97 % (12/19 0535) Weight:  [81.647 kg (180 lb)-86.5 kg (190 lb 11.2 oz)] 190 lb 11.2 oz (86.5 kg) (12/19 0003) Last BM Date: 05/27/11  Intake/Output from previous day: 12/18 0701 - 12/19 0700 In: 1000 [I.V.:1000] Out: 625 [Urine:325] Intake/Output this shift: Total I/O In: -  Out: 200 [Urine:200]  General appearance: alert, cooperative, no distress and mild discomfort. Resp: decrease basilar bs bilat. GI: tender, no bowel sounds no distension.  Lab Results:   Basename 05/29/11 0530 05/28/11 1650  WBC 12.7* 15.6*  HGB 12.4* 15.8  HCT 37.2* 45.7  PLT 166 189    BMET  Basename 05/29/11 0530 05/28/11 1650  NA 138 140  K 3.5 3.7  CL 104 100  CO2 27 29  GLUCOSE 118* 109*  BUN 17 19  CREATININE 1.11 1.20  CALCIUM 8.4 10.2   PT/INR No results found for this basename: LABPROT:2,INR:2 in the last 72 hours   Studies/Results: Ct Abdomen Pelvis W Contrast  05/28/2011  *RADIOLOGY REPORT*  Clinical Data: Abdominal pain, fever, nausea  CT ABDOMEN AND PELVIS WITH CONTRAST  Technique:  Multidetector CT imaging of the abdomen and pelvis was performed following the standard protocol during bolus administration of intravenous contrast.  Contrast: 18mL OMNIPAQUE IOHEXOL 300 MG/ML IV SOLN, 100mL OMNIPAQUE IOHEXOL 300 MG/ML IV SOLN  Comparison: None.  Findings: Mild dependent atelectasis in the bilateral lung bases.  Liver, spleen, pancreas, and adrenal glands are within normal limits.  Gallbladder is unremarkable.  No intrahepatic or extrahepatic ductal dilatation.  Kidneys are unremarkable.  No hydronephrosis.  No evidence of bowel obstruction.  Colonic diverticulosis, without associated inflammatory changes.  Inflammatory changes/mucosal hyperenhancement involving the appendix (series 2/image 56), compatible with acute appendicitis. No drainable fluid collection or abscess.  No free air.  Atherosclerotic calcifications of the abdominal aorta and branch vessels.  No abdominopelvic ascites.  No suspicious abdominopelvic lymphadenopathy.  Prostate is unremarkable.  Bladder is within normal limits.  Bilateral fat containing inguinal hernias.  Degenerative changes of the visualized thoracolumbar spine.  IMPRESSION: Acute appendicitis.  No drainable fluid collection or abscess.  No free air.  Critical Value/emergent results were called by telephone at the time of interpretation on 05/28/2011  at 1815 hours  to  Dr. Douglas Delo, who verbally acknowledged these results.  Original Report Authenticated By: SRIYESH KRISHNAN, M.D.    Anti-infectives: Anti-infectives     Start     Dose/Rate Route Frequency Ordered Stop   05/29/11 1500   ertapenem (INVANZ) 1 g in sodium chloride 0.9 % 50 mL IVPB        1 g 100 mL/hr over 30 Minutes Intravenous Every 24 hours 05/28/11 2347     12 /18/12 1830   ertapenem (INVANZ) 1 g in sodium chloride 0.9 % 50 mL IVPB        1 g 100 mL/hr over 30 Minutes Intravenous  Once 05/28/11 1828 05/28/11 1858         Current Facility-Administered Medications  Medication Dose Route Frequency Provider Last Rate Last Dose  . dextrose 5 % and 0.9 % NaCl with KCl 20 mEq/L  infusion   Intravenous Continuous Mariella Saa, MD 125 mL/hr at 05/29/11 0346    . ertapenem (INVANZ) 1 g in sodium chloride 0.9 % 50 mL IVPB  1 g Intravenous Once Geoffery Lyons, MD   1 g at 05/28/11 1835  . ertapenem (INVANZ) 1 g in sodium chloride 0.9 % 50 mL IVPB  1 g Intravenous Q24H Mariella Saa, MD      . heparin injection 5,000 Units  5,000 Units Subcutaneous Q8H Mariella Saa, MD   5,000 Units at 05/29/11 0702  . iohexol (OMNIPAQUE)  300 MG/ML solution 100 mL  100 mL Intravenous Once PRN Medication Radiologist   100 mL at 05/28/11 1810  . iohexol (OMNIPAQUE) 300 MG/ML solution 18 mL  18 mL Oral Once PRN Medication Radiologist   18 mL at 05/28/11 1715  . levothyroxine (SYNTHROID, LEVOTHROID) injection 50 mcg  50 mcg Intravenous Daily Mariella Saa, MD      . morphine 2 MG/ML injection 2-6 mg  2-6 mg Intravenous Q1H PRN Mariella Saa, MD      . morphine 4 MG/ML injection 4 mg  4 mg Intravenous Once Geoffery Lyons, MD   4 mg at 05/28/11 1717  . morphine 4 MG/ML injection 4 mg  4 mg Intravenous Once Geoffery Lyons, MD   4 mg at 05/28/11 1842  . ondansetron (ZOFRAN) injection 4 mg  4 mg Intravenous Once Geoffery Lyons, MD   4 mg at 05/28/11 1749  . ondansetron (ZOFRAN) tablet 4 mg  4 mg Oral Q6H PRN Mariella Saa, MD       Or  . ondansetron [facility]) injection 4 mg  4 mg Intravenous Q6H PRN Mariella Saa, MD      . pantoprazole (PROTONIX) injection 40 mg  40 mg Intravenous QHS Mariella Saa, MD   40 mg at 05/29/11 0100  . DISCONTD: bupivacaine-EPINEPHrine (MARCAINE W/ EPI) 0.5 % (with pres) injection    PRN Mariella Saa, MD   20 mL at 05/28/11 2121  . DISCONTD: fentaNYL (SUBLIMAZE) injection 25-50 mcg  25-50 mcg Intravenous Q5 min PRN Einar Pheasant, MD      . DISCONTD: lactated ringers infusion   Intravenous Continuous Einar Pheasant, MD      . DISCONTD: lactated ringers irrigation solution    PRN Mariella Saa, MD   2,000 mL at 05/28/11 2223  . DISCONTD: promethazine (PHENERGAN) injection 6.25-12.5 mg  6.25-12.5 mg Intravenous Q15 min PRN Einar Pheasant, MD      . DISCONTD: sodium chloride irrigation 0.9 %    PRN Mariella Saa, MD   1,000 mL at 05/28/11 2144   Facility-Administered Medications Ordered in Other Encounters  Medication Dose Route Frequency Provider Last Rate Last Dose  . DISCONTD: fentaNYL (SUBLIMAZE) injection    PRN Rolly Pancake   100 mcg at 05/28/11  2138  . DISCONTD: glycopyrrolate (ROBINUL) injection    PRN Rolly Pancake   0.6 mg at 05/28/11 2222  . DISCONTD: lactated ringers infusion    Continuous PRN Rolly Pancake      . DISCONTD: lidocaine (cardiac) 100 mg/8ml (XYLOCAINE) 20 MG/ML injection 2%    PRN Rolly Pancake   80 mg at 05/28/11 2100  . DISCONTD: neostigmine (PROSTIGMINE) injection   Intravenous PRN Rolly Pancake   4 mg at 05/28/11 2222  . DISCONTD: ondansetron (ZOFRAN) injection    PRN Rolly Pancake   4 mg at 05/28/11  2212  . DISCONTD: propofol (DIPRIVAN) 10 mg/mL bolus    PRN Rolly Pancake   160 mg at 05/28/11 2100  . DISCONTD: rocuronium (ZEMURON) injection    PRN Rolly Pancake   10 mg at 05/28/11 2151  . DISCONTD: succinylcholine (ANECTINE) injection    PRN Rolly Pancake   100 mg at 05/28/11 2100    Assessment/Plan POD1 Laparoscopic Appendectomy for perforated/gangrenous appendix. Patient Active Problem List  Diagnoses  . Appendicitis, acute  . Glaucoma  . Hypothyroid  . Dyslipidemia  . Depression  Plan: Continue antibiotics, IV hydration, get his eye drops from home restarted.  Restart Effexor. IS and mobilize..   LOS: 1 day    JENNINGS,WILLARD 05/29/2011  Attending: Discussed with Dr. Johna Sheriff.  Perforated appendicitis with peritonitis.  No fecal contamination.  Plan 3-5 days of IV abx's, followed by 7-10 days oral abx therapy.  At risk for abscess and SBO.  Will follow closely.  Velora Heckler, MD, FACS General & Endocrine Surgery [facility]

## 2011-05-30 ENCOUNTER — Encounter (HOSPITAL_COMMUNITY): Payer: Self-pay | Admitting: General Surgery

## 2011-05-30 LAB — CBC
Hemoglobin: 12.1 g/dL — ABNORMAL LOW (ref 13.0–17.0)
MCH: 30.1 pg (ref 26.0–34.0)
Platelets: 172 10*3/uL (ref 150–400)
RBC: 4.02 MIL/uL — ABNORMAL LOW (ref 4.22–5.81)
WBC: 8.1 10*3/uL (ref 4.0–10.5)

## 2011-05-30 NOTE — Plan of Care (Signed)
Problem: Phase I Progression Outcomes Goal: OOB as tolerated unless otherwise ordered Outcome: Completed/Met Date Met:  05/30/11 Continues to ambulate up and down the halls without difficulty.

## 2011-05-30 NOTE — Progress Notes (Signed)
2 Days Post-Op  Subjective: He has had pain since "Sunday, up to bathroom voiding.  No flatus today either.  Going slowly with pain meds.  He's afraid it will slow down gut.  Objective: Vital signs in last 24 hours: Temp:  [98.5 F (36.9 C)-99.4 F (37.4 C)] 98.5 F (36.9 C) (12/20 0532) Pulse Rate:  [76-84] 76  (12/20 0532) Resp:  [18] 18  (12/20 0532) BP: (121-156)/(67-80) 156/80 mmHg (12/20 0532) SpO2:  [96 %-99 %] 96 % (12/20 0532) Last BM Date: 05/27/11  Intake/Output from previous day: 12/19 0701 - 12/20 0700 In: 3329.2 [I.V.:3279.2; IV Piggyback:50] Out: 1950 [Urine:1950] Intake/Output this shift: Total I/O In: -  Out: 400 [Urine:400]  General appearance: alert, cooperative, no distress and mild discomfort. Resp: decrease basilar bs bilat. GI: tender, no bowel sounds no distension. Incisions looks good.  Lab Results:   Basename 05/30/11 0502 05/29/11 0530  WBC 8.1 12.7*  HGB 12.1* 12.4*  HCT 36.3* 37.2*  PLT 172 166    BMET  Basename 05/29/11 0530 05/28/11 1650  NA 138 140  K 3.5 3.7  CL 104 100  CO2 27 29  GLUCOSE 118* 109*  BUN 17 19  CREATININE 1.11 1.20  CALCIUM 8.4 10.2   PT/INR No results found for this basename: LABPROT:2,INR:2 in the last 72 hours   Studies/Results: Ct Abdomen Pelvis W Contrast  05/28/2011  *RADIOLOGY REPORT*  Clinical Data: Abdominal pain, fever, nausea  CT ABDOMEN AND PELVIS WITH CONTRAST  Technique:  Multidetector CT imaging of the abdomen and pelvis was performed following the standard protocol during bolus administration of intravenous contrast.  Contrast: 18mL OMNIPAQUE IOHEXOL 300 MG/ML IV SOLN, 100mL OMNIPAQUE IOHEXOL 300 MG/ML IV SOLN  Comparison: None.  Findings: Mild dependent atelectasis in the bilateral lung bases.  Liver, spleen, pancreas, and adrenal glands are within normal limits.  Gallbladder is unremarkable.  No intrahepatic or extrahepatic ductal dilatation.  Kidneys are unremarkable.  No hydronephrosis.  No  evidence of bowel obstruction. Colonic diverticulosis, without associated inflammatory changes.  Inflammatory changes/mucosal hyperenhancement involving the appendix (series 2/image 56), compatible with acute appendicitis. No drainable fluid collection or abscess.  No free air.  Atherosclerotic calcifications of the abdominal aorta and branch vessels.  No abdominopelvic ascites.  No suspicious abdominopelvic lymphadenopathy.  Prostate is unremarkable.  Bladder is within normal limits.  Bilateral fat containing inguinal hernias.  Degenerative changes of the visualized thoracolumbar spine.  IMPRESSION: Acute appendicitis.  No drainable fluid collection or abscess.  No free air.  Critical Value/emergent results were called by telephone at the time of interpretation on 05/28/2011  at 1815 hours  to  Dr. Douglas Delo, who verbally acknowledged these results.  Original Report Authenticated By: SRIYESH KRISHNAN, M.D.    Anti-infectives: Anti-infectives     Start     Dose/Rate Route Frequency Ordered Stop   05/29/11 1500   ertapenem (INVANZ) 1 g in sodium chloride 0.9 % 50 mL IVPB        1 g 100 mL/hr over 30 Minutes Intravenous Every 24 hours 05/28/11 2347     12" /18/12 1830   ertapenem (INVANZ) 1 g in sodium chloride 0.9 % 50 mL IVPB        1 g 100 mL/hr over 30 Minutes Intravenous  Once 05/28/11 1828 05/28/11 1858         Current Facility-Administered Medications  Medication Dose Route Frequency Provider Last Rate Last Dose  . dextrose 5 % and 0.9 % NaCl with KCl 20  mEq/L infusion   Intravenous Continuous Mariella Saa, MD 125 mL/hr at 05/29/11 1407    . diphenhydrAMINE (BENADRYL) capsule 25 mg  25 mg Oral Once Rulon Abide, DO   25 mg at 05/29/11 2209  . ertapenem (INVANZ) 1 g in sodium chloride 0.9 % 50 mL IVPB  1 g Intravenous Q24H Mariella Saa, MD   1 g at 05/29/11 1618  . heparin injection 5,000 Units  5,000 Units Subcutaneous Q8H Mariella Saa, MD   5,000 Units at  05/30/11 [phone]  . lactated ringers infusion   Intravenous Continuous Rolly Pancake      . levothyroxine (SYNTHROID, LEVOTHROID) injection 50 mcg  50 mcg Intravenous Daily Mariella Saa, MD   50 mcg at 05/29/11 1000  . morphine 2 MG/ML injection 2-6 mg  2-6 mg Intravenous Q1H PRN Mariella Saa, MD      . ondansetron [facility]) tablet 4 mg  4 mg Oral Q6H PRN Mariella Saa, MD       Or  . ondansetron [facility]) injection 4 mg  4 mg Intravenous Q6H PRN Mariella Saa, MD      . pantoprazole (PROTONIX) injection 40 mg  40 mg Intravenous QHS Mariella Saa, MD   40 mg at 05/29/11 2206  . Travoprost (BAK Free) (TRAVATAN) 0.004 % ophthalmic solution SOLN 1 drop  1 drop Both Eyes QHS Clance Boll, PHARMD   1 drop at 05/29/11 2207  . venlafaxine (EFFEXOR) tablet 50 mg  50 mg Oral Daily Sherrie George, [place]   50 mg at 05/29/11 1319  . DISCONTD: levothyroxine (SYNTHROID, LEVOTHROID) tablet 100 mcg  100 mcg Oral Daily Sherrie George, [place]        Assessment/Plan POD1 Laparoscopic Appendectomy for perforated/gangrenous appendix. Patient Active Problem List  Diagnoses  . Appendicitis, acute  . Glaucoma  . Hypothyroid  . Dyslipidemia  . Depression  Plan: Continue antibiotics, IV hydration, get his eye drops from home restarted.  Restart Effexor. IS and mobilize..   LOS: 2 days    Charmeka Freeburg 05/30/2011

## 2011-05-30 NOTE — Progress Notes (Signed)
Surgery:  Patient stable.  No nausea or vomiting.  Would like to try liquids.  Ambulating.  Continue IV abx.  Begin CL diet.  OOB.  Velora Heckler, MD, FACS General & Endocrine Surgery [facility]

## 2011-05-31 MED ORDER — TRAVOPROST (BAK FREE) 0.004 % OP SOLN: 1.0000 [drp] | Freq: Every day | OPHTHALMIC | Status: DC

## 2011-05-31 MED ORDER — ACETAMINOPHEN 325 MG PO TABS: 650.0000 mg | ORAL_TABLET | Freq: Four times a day (QID) | ORAL | Status: DC | PRN

## 2011-05-31 MED ORDER — BISOPROLOL-HYDROCHLOROTHIAZIDE 2.5-6.25 MG PO TABS
1.0000 | ORAL_TABLET | Freq: Every day | ORAL | Status: DC
  Administered 2011-05-31 – 2011-06-02 (×3): 1 via ORAL
  Filled 2011-05-31 (×3): qty 1

## 2011-05-31 MED ORDER — ROSUVASTATIN CALCIUM 10 MG PO TABS
10.0000 mg | ORAL_TABLET | Freq: Every day | ORAL | Status: DC
  Administered 2011-06-02: 10 mg via ORAL
  Filled 2011-05-31 (×3): qty 1

## 2011-05-31 MED ORDER — PANTOPRAZOLE SODIUM 40 MG PO TBEC
40.0000 mg | DELAYED_RELEASE_TABLET | Freq: Every day | ORAL | Status: DC
  Administered 2011-06-01 – 2011-06-02 (×2): 40 mg via ORAL
  Filled 2011-05-31 (×2): qty 1

## 2011-05-31 MED ORDER — HYDROCODONE-ACETAMINOPHEN 5-325 MG PO TABS: 1.0000 | ORAL_TABLET | ORAL | Status: DC | PRN

## 2011-05-31 MED ORDER — MAGNESIUM OXIDE 400 MG PO TABS
400.0000 mg | ORAL_TABLET | Freq: Every day | ORAL | Status: DC
  Administered 2011-06-01 – 2011-06-02 (×2): 400 mg via ORAL
  Filled 2011-05-31 (×3): qty 1

## 2011-05-31 MED ORDER — LEVOTHYROXINE SODIUM 100 MCG PO TABS
100.0000 ug | ORAL_TABLET | Freq: Every day | ORAL | Status: DC
  Administered 2011-06-01 – 2011-06-02 (×2): 100 ug via ORAL
  Filled 2011-05-31 (×2): qty 1

## 2011-05-31 MED ORDER — ASPIRIN EC 81 MG PO TBEC
81.0000 mg | DELAYED_RELEASE_TABLET | Freq: Every day | ORAL | Status: DC
  Administered 2011-06-02: 81 mg via ORAL
  Filled 2011-05-31 (×3): qty 1

## 2011-05-31 NOTE — Progress Notes (Signed)
[facility] Attending:  Patient doing well.  Less pain.  Tolerating [place] diet.  Ambulating.  Likely home in 1-2 days on oral abx.  Velora Heckler, MD, FACS General & Endocrine Surgery [facility]

## 2011-05-31 NOTE — Progress Notes (Signed)
3 Days Post-Op  Subjective: Passed some gas, took some PO clears, and did well not taking much for pain. Concerned BP is up.   Objective: Vital signs in last 24 hours: Temp:  [98.1 F (36.7 C)-98.9 F (37.2 C)] 98.5 F (36.9 C) (12/21 0618) Pulse Rate:  [73-85] 73  (12/21 0618) Resp:  [16-18] 18  (12/21 0618) BP: (134-150)/(65-83) 150/81 mmHg (12/21 0618) SpO2:  [94 %-99 %] 95 % (12/21 0618) Last BM Date: 05/27/11  Intake/Output from previous day: 12/20 0701 - 12/21 0700 In: 1610 [P.O.:360; I.V.:1250] Out: 2100 [Urine:2100] Intake/Output this shift:    General appearance: alert, cooperative, no distress and mild discomfort. Resp: decrease basilar bs bilat. GI: tender, no bowel sounds no distension. Incisions looks good.  Lab Results:   Basename 05/30/11 0502 05/29/11 0530  WBC 8.1 12.7*  HGB 12.1* 12.4*  HCT 36.3* 37.2*  PLT 172 166    BMET  Basename 05/29/11 0530 05/28/11 1650  NA 138 140  K 3.5 3.7  CL 104 100  CO2 27 29  GLUCOSE 118* 109*  BUN 17 19  CREATININE 1.11 1.20  CALCIUM 8.4 10.2   PT/INR No results found for this basename: LABPROT:2,INR:2 in the last 72 hours   Studies/Results: No results found.  Anti-infectives: Anti-infectives     Start     Dose/Rate Route Frequency Ordered Stop   05/29/11 1500   ertapenem (INVANZ) 1 g in sodium chloride 0.9 % 50 mL IVPB        1 g 100 mL/hr over 30 Minutes Intravenous Every 24 hours 05/28/11 2347     05/28/11 1830   ertapenem (INVANZ) 1 g in sodium chloride 0.9 % 50 mL IVPB        1 g 100 mL/hr over 30 Minutes Intravenous  Once 05/28/11 1828 05/28/11 1858         Current Facility-Administered Medications  Medication Dose Route Frequency Provider Last Rate Last Dose  . dextrose 5 % and 0.9 % NaCl with KCl 20 mEq/L infusion   Intravenous Continuous Mariella Saa, MD 125 mL/hr at 05/30/11 2355    . ertapenem (INVANZ) 1 g in sodium chloride 0.9 % 50 mL IVPB  1 g Intravenous Q24H Mariella Saa, MD   1 g at 05/30/11 1420  . heparin injection 5,000 Units  5,000 Units Subcutaneous Q8H Mariella Saa, MD   5,000 Units at 05/31/11 [phone]  . lactated ringers infusion   Intravenous Continuous Rolly Pancake      . levothyroxine (SYNTHROID, LEVOTHROID) injection 50 mcg  50 mcg Intravenous Daily Mariella Saa, MD   50 mcg at 05/30/11 0929  . morphine 2 MG/ML injection 2-6 mg  2-6 mg Intravenous Q1H PRN Mariella Saa, MD   2 mg at 05/30/11 2056  . ondansetron (ZOFRAN) tablet 4 mg  4 mg Oral Q6H PRN Mariella Saa, MD       Or  . ondansetron [facility]) injection 4 mg  4 mg Intravenous Q6H PRN Mariella Saa, MD      . pantoprazole (PROTONIX) injection 40 mg  40 mg Intravenous QHS Mariella Saa, MD   40 mg at 05/30/11 2235  . Travoprost (BAK Free) (TRAVATAN) 0.004 % ophthalmic solution SOLN 1 drop  1 drop Both Eyes QHS Clance Boll, PHARMD   1 drop at 05/30/11 2236  . venlafaxine (EFFEXOR) tablet 50 mg  50 mg Oral Daily Sherrie George, [place]   50 mg at 05/30/11  1610    Assessment/Plan POD1 Laparoscopic Appendectomy for perforated/gangrenous appendix. Patient Active Problem List  Diagnoses  . Appendicitis, acute  . Glaucoma  . Hypothyroid  . Dyslipidemia  . Depression  Plan: Continue antibiotics, IV hydration, get his eye drops from home restarted.  Restart Effexor. IS and mobilize.Marland Kitchen Home meds and full liquids.  LOS: 3 days    Jake Collins 05/31/2011

## 2011-06-01 LAB — COMPREHENSIVE METABOLIC PANEL
AST: 26 U/L (ref 0–37)
Albumin: 2.5 g/dL — ABNORMAL LOW (ref 3.5–5.2)
Calcium: 9 mg/dL (ref 8.4–10.5)
Chloride: 101 mEq/L (ref 96–112)
Creatinine, Ser: 1.03 mg/dL (ref 0.50–1.35)
Total Protein: 6.4 g/dL (ref 6.0–8.3)

## 2011-06-01 LAB — CBC
MCH: 30.3 pg (ref 26.0–34.0)
MCV: 88.8 fL (ref 78.0–100.0)
Platelets: 227 10*3/uL (ref 150–400)
RDW: 12.8 % (ref 11.5–15.5)
WBC: 8.9 10*3/uL (ref 4.0–10.5)

## 2011-06-01 MED ORDER — CIPROFLOXACIN HCL 500 MG PO TABS
500.0000 mg | ORAL_TABLET | Freq: Two times a day (BID) | ORAL | Status: DC
  Administered 2011-06-01 – 2011-06-02 (×3): 500 mg via ORAL
  Filled 2011-06-01 (×4): qty 1

## 2011-06-01 MED ORDER — METRONIDAZOLE 500 MG PO TABS
500.0000 mg | ORAL_TABLET | Freq: Three times a day (TID) | ORAL | Status: DC
  Administered 2011-06-01 – 2011-06-02 (×5): 500 mg via ORAL
  Filled 2011-06-01 (×7): qty 1

## 2011-06-01 MED ORDER — MAGNESIUM HYDROXIDE 400 MG/5ML PO SUSP
30.0000 mL | Freq: Every day | ORAL | Status: DC
  Administered 2011-06-01 – 2011-06-02 (×2): 30 mL via ORAL
  Filled 2011-06-01 (×2): qty 30

## 2011-06-01 MED ORDER — MAGNESIUM HYDROXIDE NICU ORAL SYRINGE 400 MG/5 ML: 30.0000 mL | Freq: Every day | ORAL | Status: DC

## 2011-06-01 NOTE — Progress Notes (Signed)
4 Days Post-Op  Subjective: Patient states he is feeling a little better. Passing lots of flatus. No bowel movement. Also laxative. Tolerating full liquids. Ambulatory. Voiding well. No respiratory complaints.  Objective: Vital signs in last 24 hours: Temp:  [98.7 F (37.1 C)-99.8 F (37.7 C)] 98.7 F (37.1 C) (12/22 0535) Pulse Rate:  [69-74] 69  (12/22 0535) Resp:  [18] 18  (12/22 0535) BP: (137-155)/(71-80) 137/71 mmHg (12/22 0535) SpO2:  [97 %-98 %] 97 % (12/22 0535) Last BM Date: 05/27/11  Intake/Output from previous day: 12/21 0701 - 12/22 0700 In: 2832.5 [P.O.:720; I.V.:2062.5; IV Piggyback:50] Out: 1600 [Urine:1600] Intake/Output this shift:    GI: abdomen is soft with hypoactive bowel sounds. Wounds looked fine. We'll still a little tender in the right lower quadrant.  Lab Results:  Results for orders placed during the hospital encounter of 05/28/11 (from the past 24 hour(s))  CBC     Status: Normal   Collection Time   06/01/11  5:35 AM      Component Value Range   WBC 8.9  4.0 - 10.5 (K/uL)   RBC 4.39  4.22 - 5.81 (MIL/uL)   Hemoglobin 13.3  13.0 - 17.0 (g/dL)   HCT 04.5  40.9 - 81.1 (%)   MCV 88.8  78.0 - 100.0 (fL)   MCH 30.3  26.0 - 34.0 (pg)   MCHC 34.1  30.0 - 36.0 (g/dL)   RDW 91.4  78.2 - 95.6 (%)   Platelets 227  150 - 400 (K/uL)  COMPREHENSIVE METABOLIC PANEL     Status: Abnormal   Collection Time   06/01/11  5:35 AM      Component Value Range   Sodium 136  135 - 145 (mEq/L)   Potassium 3.4 (*) 3.5 - 5.1 (mEq/L)   Chloride 101  96 - 112 (mEq/L)   CO2 27  19 - 32 (mEq/L)   Glucose, Bld 97  70 - 99 (mg/dL)   BUN 6  6 - 23 (mg/dL)   Creatinine, Ser 2.13  0.50 - 1.35 (mg/dL)   Calcium 9.0  8.4 - 08.6 (mg/dL)   Total Protein 6.4  6.0 - 8.3 (g/dL)   Albumin 2.5 (*) 3.5 - 5.2 (g/dL)   AST 26  0 - 37 (U/L)   ALT 26  0 - 53 (U/L)   Alkaline Phosphatase 98  39 - 117 (U/L)   Total Bilirubin 0.6  0.3 - 1.2 (mg/dL)   GFR calc non Af Amer 73 (*) >90  (mL/min)   GFR calc Af Amer 85 (*) >90 (mL/min)  MAGNESIUM     Status: Normal   Collection Time   06/01/11  5:35 AM      Component Value Range   Magnesium 2.1  1.5 - 2.5 (mg/dL)     Studies/Results: @RISRSLT24 @     . aspirin EC  81 mg Oral Daily  . bisoprolol-hydrochlorothiazide  1 tablet Oral Daily  . ertapenem (INVANZ) IV  1 g Intravenous Q24H  . heparin  5,000 Units Subcutaneous Q8H  . levothyroxine  100 mcg Oral QAC breakfast  . magnesium hydroxide  30 mL Oral Daily  . magnesium oxide  400 mg Oral Daily  . pantoprazole  40 mg Oral Q1200  . rosuvastatin  10 mg Oral Daily  . Travoprost (BAK Free)  1 drop Both Eyes QHS  . venlafaxine  50 mg Oral Daily  . DISCONTD: levothyroxine  50 mcg Intravenous Daily  . DISCONTD: pantoprazole (PROTONIX) IV  40 mg  Intravenous QHS  . DISCONTD: Travoprost (BAK Free)  1 drop Both Eyes QHS     Assessment/Plan: s/p Procedure(s): APPENDECTOMY LAPAROSCOPIC  Status post laparoscopic appendectomy for perforated gangrenous appendix.  Postoperative ileus, resolving slowly.  We'll allow a laxative, advance diet, switched to by mouth antibiotics.  The patient is very hopeful that he can be discharged home tomorrow because of the Christmas holidays and family plans. I told him we would make a decision tomorrow    LOS: 4 days    Mel[place] Jakubiak M 06/01/2011  . .prob

## 2011-06-02 MED ORDER — HYDROCODONE-ACETAMINOPHEN 5-325 MG PO TABS: 1.0000 | ORAL_TABLET | ORAL | Status: AC | PRN

## 2011-06-02 MED ORDER — CIPROFLOXACIN HCL 500 MG PO TABS: 500.0000 mg | ORAL_TABLET | Freq: Two times a day (BID) | ORAL | Status: AC

## 2011-06-02 MED ORDER — METRONIDAZOLE 500 MG PO TABS: 500.0000 mg | ORAL_TABLET | Freq: Three times a day (TID) | ORAL | Status: AC

## 2011-06-02 NOTE — Progress Notes (Signed)
All d/c instructions, meds, rx, and need to call for f/u appt. On Wednesday after holidays discussed with pt.,. Understanding verb.,. S/s of infection discussed with understanding of when to call m.d. Verbalized.

## 2011-06-02 NOTE — Progress Notes (Signed)
5 Days Post-Op  Subjective: No nausea, pain controlled. Tolerating diet  Objective: Vital signs in last 24 hours: Temp:  [97.3 F (36.3 C)-98.9 F (37.2 C)] 98.6 F (37 C) (12/23 0600) Pulse Rate:  [71-78] 78  (12/23 0600) Resp:  [18] 18  (12/23 0600) BP: (115-162)/(60-81) 115/60 mmHg (12/23 0600) SpO2:  [95 %-99 %] 95 % (12/23 0600) Last BM Date: 06/02/11  Intake/Output from previous day: 12/22 0701 - 12/23 0700 In: 1240 [P.O.:240; I.V.:1000] Out: 1 [Stool:1] Intake/Output this shift: Total I/O In: 420 [P.O.:420] Out: -   General appearance: alert, cooperative and no distress GI: soft, minimal RLQ tenderness, mild distension, no peritoneal signs  Lab Results:   [facility] 06/01/11 0535  WBC 8.9  HGB 13.3  HCT 39.0  PLT 227   BMET  Basename 06/01/11 0535  NA 136  K 3.4*  CL 101  CO2 27  GLUCOSE 97  BUN 6  CREATININE 1.03  CALCIUM 9.0   PT/INR No results found for this basename: LABPROT:2,INR:2 in the last 72 hours ABG No results found for this basename: PHART:2,PCO2:2,PO2:2,HCO3:2 in the last 72 hours  Studies/Results: No results found.  Anti-infectives: Anti-infectives     Start     Dose/Rate Route Frequency Ordered Stop   06/01/11 0900   ciprofloxacin (CIPRO) tablet 500 mg        500 mg Oral 2 times daily 06/01/11 0746     06/01/11 0900   metroNIDAZOLE (FLAGYL) tablet 500 mg        500 mg Oral 3 times per day 06/01/11 0746     05/29/11 1500   ertapenem (INVANZ) 1 g in sodium chloride 0.9 % 50 mL IVPB  Status:  Discontinued        1 g 100 mL/hr over 30 Minutes Intravenous Every 24 hours 05/28/11 2347 06/01/11 0746   05/28/11 1830   ertapenem (INVANZ) 1 g in sodium chloride 0.9 % 50 mL IVPB        1 g 100 mL/hr over 30 Minutes Intravenous  Once 05/28/11 1828 05/28/11 1858          Assessment/Plan: s/p Procedure(s): APPENDECTOMY LAPAROSCOPIC He looks and feels well and wants to go home.  I think that it would be okay for discharge today  but I did recommend that he stay for one more night to ensure that he is doing well given the slight distension.  He is moving his bowels and passing flatus and tolerating regular diet so again, I think that it is okay to send him home.  He agreed to return to the ER if any increase in pain, distension, nausea or fevers.  Will send him home with oral antibiotics per recs of operating surgeon.  LOS: 5 days    Lodema Pilot DAVID 06/02/2011

## 2011-06-03 NOTE — Progress Notes (Signed)
Utilization review completed.  

## 2011-06-20 ENCOUNTER — Ambulatory Visit (INDEPENDENT_AMBULATORY_CARE_PROVIDER_SITE_OTHER): Payer: Medicare Other | Admitting: General Surgery

## 2011-06-20 ENCOUNTER — Encounter (INDEPENDENT_AMBULATORY_CARE_PROVIDER_SITE_OTHER): Payer: Self-pay | Admitting: General Surgery

## 2011-06-20 VITALS — BP 142/88 | HR 68 | Temp 97.8°F | Resp 12 | Ht 70.0 in | Wt 180.2 lb

## 2011-06-20 DIAGNOSIS — Z9889 Other specified postprocedural states: Secondary | ICD-10-CM

## 2011-06-20 MED ORDER — HYDROCORTISONE 2.5 % RE CREA: TOPICAL_CREAM | Freq: Two times a day (BID) | RECTAL | Status: AC

## 2011-06-20 NOTE — Progress Notes (Signed)
Patient returns to to 3 weeks following laparoscopic appendectomy for perforated appendicitis. He was in the hospital on IV antibiotics for 5 days and then completed a one-week course of oral Cipro and Flagyl. This resulted in diarrhea and he now has some rectal irritation and itching but otherwise is feeling well. No fever or abdominal pain. he's getting back to normal activities. Abdomen some return to his normal.  On exam he is afebrile. He appears well. Abdomen is soft and nontender. Wounds are well-healed without hernias.  Assessment plan: Doing well following emergency appendectomy for perforated appendicitis. I wrote him a prescription for Proctosol cream. He returned here as needed.

## 2012-12-03 ENCOUNTER — Other Ambulatory Visit: Payer: Self-pay | Admitting: Dermatology

## 2013-09-17 ENCOUNTER — Telehealth: Payer: Self-pay | Admitting: Interventional Cardiology

## 2013-09-17 NOTE — Telephone Encounter (Signed)
New Message  Pt called requesting a same day appt.Jake Collins He was told per his primary care that he had an enlarged aorta. Offered pt next appt with Dr. Tamala Julian, NP and PA. Pt declined for a same day appt.  Pt also requests a call back to determine the severity of an enlarged aorta. Please assist.

## 2013-09-17 NOTE — Telephone Encounter (Signed)
I spoke with the pt and he had an x-ray performed today and the doctor told him the x-ray measured his aorta as 22mm.  The pt is anxious about his aorta being enlarged.  I made the pt aware that this is not something that requires a same day appointment.  I made the pt an appointment per his request on 11/04/13 with Dr Tamala Julian. The pt will bring x-ray to appointment. I advised the pt that there are other cardiac tests that may be ordered by Dr Tamala Julian to appropriately measure the size of his aorta. I will forward this message to Dr Tamala Julian to make him aware of the pt's phone call.

## 2013-09-22 ENCOUNTER — Other Ambulatory Visit: Payer: Self-pay | Admitting: Internal Medicine

## 2013-09-22 DIAGNOSIS — I10 Essential (primary) hypertension: Secondary | ICD-10-CM

## 2013-09-24 ENCOUNTER — Ambulatory Visit
Admission: RE | Admit: 2013-09-24 | Discharge: 2013-09-24 | Disposition: A | Discharge status: Home / Self Care | Payer: Medicare Other | Source: Ambulatory Visit | Attending: Internal Medicine | Admitting: Internal Medicine

## 2013-09-24 ENCOUNTER — Other Ambulatory Visit: Payer: Self-pay | Admitting: Internal Medicine

## 2013-09-24 DIAGNOSIS — I10 Essential (primary) hypertension: Secondary | ICD-10-CM

## 2013-11-04 ENCOUNTER — Ambulatory Visit: Payer: Medicare Other | Admitting: Interventional Cardiology

## 2015-06-14 DIAGNOSIS — I10 Essential (primary) hypertension: Secondary | ICD-10-CM | POA: Diagnosis not present

## 2015-07-10 DIAGNOSIS — Z23 Encounter for immunization: Secondary | ICD-10-CM | POA: Diagnosis not present

## 2015-07-10 DIAGNOSIS — L570 Actinic keratosis: Secondary | ICD-10-CM | POA: Diagnosis not present

## 2015-08-11 DIAGNOSIS — H9113 Presbycusis, bilateral: Secondary | ICD-10-CM | POA: Diagnosis not present

## 2015-08-11 DIAGNOSIS — H9313 Tinnitus, bilateral: Secondary | ICD-10-CM | POA: Diagnosis not present

## 2015-08-11 DIAGNOSIS — H903 Sensorineural hearing loss, bilateral: Secondary | ICD-10-CM | POA: Diagnosis not present

## 2015-08-18 DIAGNOSIS — E039 Hypothyroidism, unspecified: Secondary | ICD-10-CM | POA: Diagnosis not present

## 2015-08-18 DIAGNOSIS — E7800 Pure hypercholesterolemia, unspecified: Secondary | ICD-10-CM | POA: Diagnosis not present

## 2015-08-18 DIAGNOSIS — H401122 Primary open-angle glaucoma, left eye, moderate stage: Secondary | ICD-10-CM | POA: Diagnosis not present

## 2015-08-18 DIAGNOSIS — H5213 Myopia, bilateral: Secondary | ICD-10-CM | POA: Diagnosis not present

## 2015-08-18 DIAGNOSIS — H2513 Age-related nuclear cataract, bilateral: Secondary | ICD-10-CM | POA: Diagnosis not present

## 2015-08-25 DIAGNOSIS — Z125 Encounter for screening for malignant neoplasm of prostate: Secondary | ICD-10-CM | POA: Diagnosis not present

## 2015-08-25 DIAGNOSIS — I10 Essential (primary) hypertension: Secondary | ICD-10-CM | POA: Diagnosis not present

## 2015-08-25 DIAGNOSIS — E7800 Pure hypercholesterolemia, unspecified: Secondary | ICD-10-CM | POA: Diagnosis not present

## 2015-08-25 DIAGNOSIS — E039 Hypothyroidism, unspecified: Secondary | ICD-10-CM | POA: Diagnosis not present

## 2015-09-22 DIAGNOSIS — H5711 Ocular pain, right eye: Secondary | ICD-10-CM | POA: Diagnosis not present

## 2015-09-22 DIAGNOSIS — T1511XA Foreign body in conjunctival sac, right eye, initial encounter: Secondary | ICD-10-CM | POA: Diagnosis not present

## 2015-09-22 DIAGNOSIS — S0501XA Injury of conjunctiva and corneal abrasion without foreign body, right eye, initial encounter: Secondary | ICD-10-CM | POA: Diagnosis not present

## 2015-09-22 DIAGNOSIS — H401134 Primary open-angle glaucoma, bilateral, indeterminate stage: Secondary | ICD-10-CM | POA: Diagnosis not present

## 2015-11-07 DIAGNOSIS — F331 Major depressive disorder, recurrent, moderate: Secondary | ICD-10-CM | POA: Diagnosis not present

## 2016-01-17 DIAGNOSIS — C44612 Basal cell carcinoma of skin of right upper limb, including shoulder: Secondary | ICD-10-CM | POA: Diagnosis not present

## 2016-01-17 DIAGNOSIS — L570 Actinic keratosis: Secondary | ICD-10-CM | POA: Diagnosis not present

## 2016-01-17 DIAGNOSIS — D225 Melanocytic nevi of trunk: Secondary | ICD-10-CM | POA: Diagnosis not present

## 2016-01-17 DIAGNOSIS — D485 Neoplasm of uncertain behavior of skin: Secondary | ICD-10-CM | POA: Diagnosis not present

## 2016-01-17 DIAGNOSIS — L821 Other seborrheic keratosis: Secondary | ICD-10-CM | POA: Diagnosis not present

## 2016-01-17 DIAGNOSIS — Z85828 Personal history of other malignant neoplasm of skin: Secondary | ICD-10-CM | POA: Diagnosis not present

## 2016-01-17 DIAGNOSIS — Z85820 Personal history of malignant melanoma of skin: Secondary | ICD-10-CM | POA: Diagnosis not present

## 2016-02-15 DIAGNOSIS — C44612 Basal cell carcinoma of skin of right upper limb, including shoulder: Secondary | ICD-10-CM | POA: Diagnosis not present

## 2016-02-23 DIAGNOSIS — E039 Hypothyroidism, unspecified: Secondary | ICD-10-CM | POA: Diagnosis not present

## 2016-02-23 DIAGNOSIS — I10 Essential (primary) hypertension: Secondary | ICD-10-CM | POA: Diagnosis not present

## 2016-02-23 DIAGNOSIS — Z125 Encounter for screening for malignant neoplasm of prostate: Secondary | ICD-10-CM | POA: Diagnosis not present

## 2016-02-27 DIAGNOSIS — H401111 Primary open-angle glaucoma, right eye, mild stage: Secondary | ICD-10-CM | POA: Diagnosis not present

## 2016-02-27 DIAGNOSIS — H401122 Primary open-angle glaucoma, left eye, moderate stage: Secondary | ICD-10-CM | POA: Diagnosis not present

## 2016-03-20 DIAGNOSIS — R972 Elevated prostate specific antigen [PSA]: Secondary | ICD-10-CM | POA: Diagnosis not present

## 2016-03-20 DIAGNOSIS — E7800 Pure hypercholesterolemia, unspecified: Secondary | ICD-10-CM | POA: Diagnosis not present

## 2016-03-20 DIAGNOSIS — E039 Hypothyroidism, unspecified: Secondary | ICD-10-CM | POA: Diagnosis not present

## 2016-03-20 DIAGNOSIS — I10 Essential (primary) hypertension: Secondary | ICD-10-CM | POA: Diagnosis not present

## 2016-04-02 DIAGNOSIS — N401 Enlarged prostate with lower urinary tract symptoms: Secondary | ICD-10-CM | POA: Diagnosis not present

## 2016-04-02 DIAGNOSIS — R350 Frequency of micturition: Secondary | ICD-10-CM | POA: Diagnosis not present

## 2016-04-02 DIAGNOSIS — R351 Nocturia: Secondary | ICD-10-CM | POA: Diagnosis not present

## 2016-05-20 DIAGNOSIS — L821 Other seborrheic keratosis: Secondary | ICD-10-CM | POA: Diagnosis not present

## 2016-05-20 DIAGNOSIS — L570 Actinic keratosis: Secondary | ICD-10-CM | POA: Diagnosis not present

## 2016-05-20 DIAGNOSIS — Z85828 Personal history of other malignant neoplasm of skin: Secondary | ICD-10-CM | POA: Diagnosis not present

## 2016-06-20 DIAGNOSIS — R972 Elevated prostate specific antigen [PSA]: Secondary | ICD-10-CM | POA: Diagnosis not present

## 2016-06-20 DIAGNOSIS — I10 Essential (primary) hypertension: Secondary | ICD-10-CM | POA: Diagnosis not present

## 2016-06-20 DIAGNOSIS — E039 Hypothyroidism, unspecified: Secondary | ICD-10-CM | POA: Diagnosis not present

## 2016-06-30 DIAGNOSIS — I77819 Aortic ectasia, unspecified site: Secondary | ICD-10-CM | POA: Insufficient documentation

## 2016-07-01 ENCOUNTER — Telehealth: Payer: Self-pay | Admitting: Interventional Cardiology

## 2016-07-01 ENCOUNTER — Ambulatory Visit (INDEPENDENT_AMBULATORY_CARE_PROVIDER_SITE_OTHER): Payer: PPO | Admitting: Interventional Cardiology

## 2016-07-01 ENCOUNTER — Encounter: Payer: Self-pay | Admitting: Interventional Cardiology

## 2016-07-01 VITALS — BP 142/66 | HR 63 | Ht 70.0 in | Wt 177.0 lb

## 2016-07-01 DIAGNOSIS — I10 Essential (primary) hypertension: Secondary | ICD-10-CM

## 2016-07-01 DIAGNOSIS — I452 Bifascicular block: Secondary | ICD-10-CM | POA: Diagnosis not present

## 2016-07-01 DIAGNOSIS — R5383 Other fatigue: Secondary | ICD-10-CM | POA: Diagnosis not present

## 2016-07-01 DIAGNOSIS — I119 Hypertensive heart disease without heart failure: Secondary | ICD-10-CM | POA: Diagnosis not present

## 2016-07-01 NOTE — Telephone Encounter (Signed)
Follow Up:   Pt just saw Dr Tamala Julian this morning.Pt wanted Dr Tamala Julian to know he had a stress test  3 or 4 yrs ago with him. His question is,does he still want him to have his stress test on Thursday? Please let him know this asap please.

## 2016-07-01 NOTE — Telephone Encounter (Signed)
Spoke with pt and made him aware that Dr. Tamala Julian does want him to continue with plan for stress test.  Pt verbalized understanding and was appreciative for call.

## 2016-07-01 NOTE — Progress Notes (Signed)
Cardiology Office Note    Date:  07/01/2016   ID:  Jake Collins, Jake Collins Jan 10, 1946, MRN [id number]  PCP:  Jani Gravel, MD  Cardiologist: Sinclair Grooms, MD   Chief Complaint  Patient presents with  . Fatigue    History of Present Illness:  Jake Collins is a 71 y.o. male with history of hypertension, hyperlipidemia, prior concern for dilated aorta that ended up being an erroneous diagnosis, and hypothyroidism.  The patient has noticed a decrease in his overall exertional tolerance. He still exercises 3 times per week. States that his wife is concerned because his energy levels have been decreased compared to prior. He has noted no decrement in his exercise program at the gym. He is not able to walk 18 holes carrying his golf clubs anymore. He still plays golf but fatigues somewhat compared to prior years. He denies chest pain, orthopnea, PND, prolonged palpitations, and syncope.  Past Medical History:  Diagnosis Date  . Cancer ([place])    skin melanoma  . Diverticulitis    2 bouts in 10 years  . Hyperlipemia   . Hypertension   . Hypothyroid   . Thyroid disease     Past Surgical History:  Procedure Laterality Date  . HERNIA REPAIR    . KNEE ARTHROSCOPY     Left  . LAPAROSCOPIC APPENDECTOMY  05/28/2011   Procedure: APPENDECTOMY LAPAROSCOPIC;  Surgeon: Edward Jolly, MD;  Location: [facility];  Service: General;  Laterality: N/A;  . ROTATOR CUFF REPAIR     Left side  . SALIVARY GLAND SURGERY     L side  . SKIN CANCER EXCISION     on his back and was benign    Current Medications: Outpatient Medications Prior to Visit  Medication Sig Dispense Refill  . bisoprolol-hydrochlorothiazide (ZIAC) 2.5-6.25 MG per tablet Take 1 tablet by mouth daily.      Marland Kitchen levothyroxine (SYNTHROID, LEVOTHROID) 100 MCG tablet Take 100 mcg by mouth daily.      . Travoprost, BAK Free, (TRAVATAN) 0.004 % SOLN ophthalmic solution Place 1 drop into both eyes at bedtime.      Marland Kitchen aspirin EC 81 MG  tablet Take 81 mg by mouth daily.      . Coenzyme Q10 200 MG capsule Take 200 mg by mouth daily.      . fish oil-omega-3 fatty acids 1000 MG capsule Take 1 g by mouth daily.      Marland Kitchen glucosamine-chondroitin 500-400 MG tablet Take 1-2 tablets by mouth daily.      . magnesium oxide (MAG-OX) 400 MG tablet Take 400 mg by mouth daily.      . rosuvastatin (CRESTOR) 10 MG tablet Take 10 mg by mouth daily.      Marland Kitchen venlafaxine (EFFEXOR) 50 MG tablet Take 50 mg by mouth daily.       No facility-administered medications prior to visit.      Allergies:   Sulfa antibiotics   Social History   Social History  . Marital status: Married    Spouse name: N/A  . Number of children: N/A  . Years of education: N/A   Social History Main Topics  . Smoking status: Never Smoker  . Smokeless tobacco: Never Used  . Alcohol use No  . Drug use: No  . Sexual activity: Not Asked   Other Topics Concern  . None   Social History Narrative  . None     Family History:  The patient's family history  includes Heart disease in his father.   ROS:   Please see the history of present illness.    An abdominal ultrasound performed several years ago raises the question of a dilated aorta. Follow-up studies did not confirm an abdominal aortic aneurysm. He does have calcification in the aorta. He has a prior history of irregular heartbeats. He occasionally feels the irregularity but this has been a complaint for greater than 20 years. He has decreased energy. He does not sleep as well as he once did.  All other systems reviewed and are negative.   PHYSICAL EXAM:   VS:  BP (!) 142/66 (BP Location: Right Arm)   Pulse 63   Ht 5\' 10"  (1.778 m)   Wt 177 lb (80.3 kg)   BMI 25.40 kg/m    GEN: Well nourished, well developed, in no acute distress  HEENT: normal  Neck: no JVD, carotid bruits, or masses Cardiac: RRR; no murmurs, rubs, or gallops,no edema  Respiratory:  clear to auscultation bilaterally, normal work of  breathing GI: soft, nontender, nondistended, + BS MS: no deformity or atrophy  Skin: warm and dry, no rash Neuro:  Alert and Oriented x 3, Strength and sensation are intact Psych: euthymic mood, full affect  Wt Readings from Last 3 Encounters:  07/01/16 177 lb (80.3 kg)  06/20/11 180 lb 3.2 oz (81.7 kg)  05/29/11 190 lb 11.2 oz (86.5 kg)      Studies/Labs Reviewed:   EKG:  EKG  Left axis deviation, nonspecific T wave abnormality, incomplete right bundle, left axis deviation, overall findings compatible with left anterior hemiblock. QRS pattern V1 and V2 likely representing a pseudo-infarct pattern related to left anterior hemiblock. LVH is noted on EKG as well based upon voltage criteria.  Recent Labs: No results found for requested labs within last 8760 hours.   Lipid Panel No results found for: CHOL, TRIG, HDL, CHOLHDL, VLDL, LDLCALC, LDLDIRECT  Additional studies/ records that were reviewed today include:  Abdominal aortic ultrasound April 2015: FINDINGS: Abdominal Aorta  No aneurysm identified.  Maximum  Diameter:  2.6 cm  IMPRESSION:  No evidence of abdominal aortic aneurysm.    ASSESSMENT:    1. Fatigue, unspecified type   2. Hypertensive heart disease without heart failure   3. Bifascicular block   4. Essential hypertension      PLAN:  In order of problems listed above:  1. It is unclear to me that fatigue has a cardiac basis. It probably does not. 2. The clinical suspicion based upon EKG and other clinical features is that he does have hypertensive heart disease without evidence of volume overload. 2-D echocardiogram to assess for the presence of left ventricular hypertrophy as suggested by EKG. If left ventricular hypertrophy is noted we may need to consider tighter blood pressure control. This study will also help to exclude the possibility of subclinical left ventricular systolic dysfunction. 3. Noted on EKG. No prior tracings to compare. This  is probably chronic. The QS pattern in V1 and V2 likely represent pseudoinfarction related to left anterior hemiblock. If there is regional wall motion abnormality on echo or any evidence of systolic dysfunction, a stress test with myocardial perfusion imaging will need to be performed. 4. 2 g sodium diet. Further adjustment in therapy may be needed depending upon results of echocardiogram.  The overall plan will be to assess for evidence of hypertensive heart disease. Further evaluation may be necessary if there is systolic dysfunction, or regional wall motion abnormality. If LV  function is normal we will give consideration to titer blood pressure control. No change in blood pressure regimen has been made at this time.  Medication Adjustments/Labs and Tests Ordered: Current medicines are reviewed at length with the patient today.  Concerns regarding medicines are outlined above.  Medication changes, Labs and Tests ordered today are listed in the Patient Instructions below. Patient Instructions  Medication Instructions:  None  Labwork: None  Testing/Procedures: Your physician has requested that you have an exercise tolerance test. For further information please visit [email or web address]. Please also follow instruction sheet, as given.  Your physician has requested that you have an echocardiogram. Echocardiography is a painless test that uses sound waves to create images of your heart. It provides your doctor with information about the size and shape of your heart and how well your heart's chambers and valves are working. This procedure takes approximately one hour. There are no restrictions for this procedure.   Follow-Up: Your physician recommends that you schedule a follow-up appointment as needed with Dr. Tamala Julian unless testing shows follow up is needed.    Any Other Special Instructions Will Be Listed Below (If Applicable).     If you need a refill on your cardiac medications before  your next appointment, please call your pharmacy.      Signed, Sinclair Grooms, MD  07/01/2016 6:59 PM    [facility] [address], [place], [place]  [address] Phone: [phone]; Fax: [phone]

## 2016-07-01 NOTE — Patient Instructions (Signed)
Medication Instructions:  None  Labwork: None  Testing/Procedures: Your physician has requested that you have an exercise tolerance test. For further information please visit [email or web address]. Please also follow instruction sheet, as given.  Your physician has requested that you have an echocardiogram. Echocardiography is a painless test that uses sound waves to create images of your heart. It provides your doctor with information about the size and shape of your heart and how well your heart's chambers and valves are working. This procedure takes approximately one hour. There are no restrictions for this procedure.   Follow-Up: Your physician recommends that you schedule a follow-up appointment as needed with Dr. Tamala Julian unless testing shows follow up is needed.    Any Other Special Instructions Will Be Listed Below (If Applicable).     If you need a refill on your cardiac medications before your next appointment, please call your pharmacy.

## 2016-07-03 ENCOUNTER — Encounter: Payer: Self-pay | Admitting: Interventional Cardiology

## 2016-07-04 ENCOUNTER — Ambulatory Visit (HOSPITAL_COMMUNITY): Payer: PPO | Attending: Internal Medicine

## 2016-07-04 ENCOUNTER — Other Ambulatory Visit: Payer: Self-pay

## 2016-07-04 ENCOUNTER — Ambulatory Visit (INDEPENDENT_AMBULATORY_CARE_PROVIDER_SITE_OTHER): Payer: PPO

## 2016-07-04 DIAGNOSIS — I119 Hypertensive heart disease without heart failure: Secondary | ICD-10-CM | POA: Diagnosis not present

## 2016-07-04 DIAGNOSIS — I083 Combined rheumatic disorders of mitral, aortic and tricuspid valves: Secondary | ICD-10-CM | POA: Insufficient documentation

## 2016-07-04 DIAGNOSIS — E785 Hyperlipidemia, unspecified: Secondary | ICD-10-CM | POA: Insufficient documentation

## 2016-07-04 LAB — EXERCISE TOLERANCE TEST
CHL CUP STRESS STAGE 1 DBP: 94 mmHg
CHL CUP STRESS STAGE 1 GRADE: 0 %
CHL CUP STRESS STAGE 1 HR: 66 {beats}/min
CHL CUP STRESS STAGE 1 SPEED: 0 mph
CHL CUP STRESS STAGE 10 DBP: 79 mmHg
CHL CUP STRESS STAGE 10 HR: 75 {beats}/min
CHL CUP STRESS STAGE 10 SBP: 159 mmHg
CHL CUP STRESS STAGE 10 SPEED: 0 mph
CHL CUP STRESS STAGE 2 HR: 61 {beats}/min
CHL CUP STRESS STAGE 2 SPEED: 0 mph
CHL CUP STRESS STAGE 3 GRADE: 0 %
CHL CUP STRESS STAGE 4 GRADE: 0 %
CHL CUP STRESS STAGE 4 SPEED: 1 mph
CHL CUP STRESS STAGE 5 DBP: 75 mmHg
CHL CUP STRESS STAGE 5 GRADE: 10 %
CHL CUP STRESS STAGE 5 HR: 81 {beats}/min
CHL CUP STRESS STAGE 5 SBP: 124 mmHg
CHL CUP STRESS STAGE 5 SPEED: 1.7 mph
CHL CUP STRESS STAGE 6 DBP: 72 mmHg
CHL CUP STRESS STAGE 6 HR: 96 {beats}/min
CHL CUP STRESS STAGE 6 SBP: 168 mmHg
CHL CUP STRESS STAGE 7 DBP: 76 mmHg
CHL CUP STRESS STAGE 8 SPEED: 4.2 mph
CHL CUP STRESS STAGE 9 DBP: 74 mmHg
CHL CUP STRESS STAGE 9 GRADE: 0 %
CHL CUP STRESS STAGE 9 SBP: 166 mmHg
CHL CUP STRESS STAGE 9 SPEED: 1.5 mph
CSEPPHR: 126 {beats}/min
Estimated workload: 11.2 METS
Exercise duration (min): 9 min
Exercise duration (sec): 43 s
MPHR: 148 {beats}/min
Percent HR: 85 %
Percent of predicted max HR: 85 %
RPE: 17
Rest HR: 60 {beats}/min
Stage 1 SBP: 178 mmHg
Stage 10 Grade: 0 %
Stage 2 Grade: 0 %
Stage 3 HR: 60 {beats}/min
Stage 3 Speed: 1 mph
Stage 4 HR: 59 {beats}/min
Stage 6 Grade: 12 %
Stage 6 Speed: 2.5 mph
Stage 7 Grade: 14 %
Stage 7 HR: 113 {beats}/min
Stage 7 SBP: 160 mmHg
Stage 7 Speed: 3.4 mph
Stage 8 Grade: 16 %
Stage 8 HR: 126 {beats}/min
Stage 9 HR: 101 {beats}/min

## 2016-07-05 ENCOUNTER — Telehealth: Payer: Self-pay | Admitting: Interventional Cardiology

## 2016-07-05 NOTE — Telephone Encounter (Signed)
Informed pt of echo and ETT results. Pt verbalized understanding.

## 2016-07-05 NOTE — Telephone Encounter (Signed)
New message ° ° ° ° ° °Returning a call to the nurse to get stress test results °

## 2016-07-22 MED ORDER — KETOROLAC TROMETHAMINE 30 MG/ML IJ SOLN
INTRAMUSCULAR | Status: AC
  Filled 2016-07-22: qty 1

## 2016-07-22 MED ORDER — FENTANYL CITRATE (PF) 100 MCG/2ML IJ SOLN
INTRAMUSCULAR | Status: AC
  Filled 2016-07-22: qty 2

## 2016-07-22 MED ORDER — PROPOFOL 10 MG/ML IV BOLUS
INTRAVENOUS | Status: AC
  Filled 2016-07-22: qty 20

## 2016-07-22 MED ORDER — MIDAZOLAM HCL 2 MG/2ML IJ SOLN
INTRAMUSCULAR | Status: AC
Start: 2016-07-22 — End: 2016-07-22
  Filled 2016-07-22: qty 2

## 2016-07-24 DIAGNOSIS — I10 Essential (primary) hypertension: Secondary | ICD-10-CM | POA: Diagnosis not present

## 2016-07-24 DIAGNOSIS — R972 Elevated prostate specific antigen [PSA]: Secondary | ICD-10-CM | POA: Diagnosis not present

## 2016-07-24 DIAGNOSIS — E039 Hypothyroidism, unspecified: Secondary | ICD-10-CM | POA: Diagnosis not present

## 2016-07-24 DIAGNOSIS — R5383 Other fatigue: Secondary | ICD-10-CM | POA: Diagnosis not present

## 2016-07-26 DIAGNOSIS — H401111 Primary open-angle glaucoma, right eye, mild stage: Secondary | ICD-10-CM | POA: Diagnosis not present

## 2016-07-26 DIAGNOSIS — H2513 Age-related nuclear cataract, bilateral: Secondary | ICD-10-CM | POA: Diagnosis not present

## 2016-07-26 DIAGNOSIS — H52203 Unspecified astigmatism, bilateral: Secondary | ICD-10-CM | POA: Diagnosis not present

## 2016-07-26 DIAGNOSIS — H401123 Primary open-angle glaucoma, left eye, severe stage: Secondary | ICD-10-CM | POA: Diagnosis not present

## 2016-11-05 DIAGNOSIS — F331 Major depressive disorder, recurrent, moderate: Secondary | ICD-10-CM | POA: Diagnosis not present

## 2017-02-11 DIAGNOSIS — E039 Hypothyroidism, unspecified: Secondary | ICD-10-CM | POA: Diagnosis not present

## 2017-02-11 DIAGNOSIS — R5383 Other fatigue: Secondary | ICD-10-CM | POA: Diagnosis not present

## 2017-02-11 DIAGNOSIS — R972 Elevated prostate specific antigen [PSA]: Secondary | ICD-10-CM | POA: Diagnosis not present

## 2017-02-11 DIAGNOSIS — I10 Essential (primary) hypertension: Secondary | ICD-10-CM | POA: Diagnosis not present

## 2017-02-18 DIAGNOSIS — I10 Essential (primary) hypertension: Secondary | ICD-10-CM | POA: Diagnosis not present

## 2017-02-18 DIAGNOSIS — E7800 Pure hypercholesterolemia, unspecified: Secondary | ICD-10-CM | POA: Diagnosis not present

## 2017-02-18 DIAGNOSIS — R972 Elevated prostate specific antigen [PSA]: Secondary | ICD-10-CM | POA: Diagnosis not present

## 2017-02-18 DIAGNOSIS — E039 Hypothyroidism, unspecified: Secondary | ICD-10-CM | POA: Diagnosis not present

## 2017-02-18 DIAGNOSIS — Z0000 Encounter for general adult medical examination without abnormal findings: Secondary | ICD-10-CM | POA: Diagnosis not present

## 2017-02-18 DIAGNOSIS — Z23 Encounter for immunization: Secondary | ICD-10-CM | POA: Diagnosis not present

## 2017-02-20 DIAGNOSIS — Z85828 Personal history of other malignant neoplasm of skin: Secondary | ICD-10-CM | POA: Diagnosis not present

## 2017-02-20 DIAGNOSIS — Z85820 Personal history of malignant melanoma of skin: Secondary | ICD-10-CM | POA: Diagnosis not present

## 2017-02-20 DIAGNOSIS — L723 Sebaceous cyst: Secondary | ICD-10-CM | POA: Diagnosis not present

## 2017-02-20 DIAGNOSIS — L821 Other seborrheic keratosis: Secondary | ICD-10-CM | POA: Diagnosis not present

## 2017-02-20 DIAGNOSIS — L570 Actinic keratosis: Secondary | ICD-10-CM | POA: Diagnosis not present

## 2017-02-20 DIAGNOSIS — Z23 Encounter for immunization: Secondary | ICD-10-CM | POA: Diagnosis not present

## 2017-02-21 DIAGNOSIS — H401123 Primary open-angle glaucoma, left eye, severe stage: Secondary | ICD-10-CM | POA: Diagnosis not present

## 2017-02-21 DIAGNOSIS — H401111 Primary open-angle glaucoma, right eye, mild stage: Secondary | ICD-10-CM | POA: Diagnosis not present

## 2017-02-21 DIAGNOSIS — H2513 Age-related nuclear cataract, bilateral: Secondary | ICD-10-CM | POA: Diagnosis not present

## 2017-03-20 DIAGNOSIS — H25812 Combined forms of age-related cataract, left eye: Secondary | ICD-10-CM | POA: Diagnosis not present

## 2017-03-20 DIAGNOSIS — H2512 Age-related nuclear cataract, left eye: Secondary | ICD-10-CM | POA: Diagnosis not present

## 2017-04-03 DIAGNOSIS — H2511 Age-related nuclear cataract, right eye: Secondary | ICD-10-CM | POA: Diagnosis not present

## 2017-04-03 DIAGNOSIS — H25811 Combined forms of age-related cataract, right eye: Secondary | ICD-10-CM | POA: Diagnosis not present

## 2017-07-16 DIAGNOSIS — J310 Chronic rhinitis: Secondary | ICD-10-CM | POA: Diagnosis not present

## 2017-07-16 DIAGNOSIS — K219 Gastro-esophageal reflux disease without esophagitis: Secondary | ICD-10-CM | POA: Diagnosis not present

## 2017-07-16 DIAGNOSIS — R05 Cough: Secondary | ICD-10-CM | POA: Diagnosis not present

## 2017-08-11 DIAGNOSIS — R972 Elevated prostate specific antigen [PSA]: Secondary | ICD-10-CM | POA: Diagnosis not present

## 2017-08-11 DIAGNOSIS — E039 Hypothyroidism, unspecified: Secondary | ICD-10-CM | POA: Diagnosis not present

## 2017-08-11 DIAGNOSIS — I10 Essential (primary) hypertension: Secondary | ICD-10-CM | POA: Diagnosis not present

## 2017-08-11 DIAGNOSIS — E7800 Pure hypercholesterolemia, unspecified: Secondary | ICD-10-CM | POA: Diagnosis not present

## 2017-08-18 DIAGNOSIS — R05 Cough: Secondary | ICD-10-CM | POA: Diagnosis not present

## 2017-08-18 DIAGNOSIS — E7800 Pure hypercholesterolemia, unspecified: Secondary | ICD-10-CM | POA: Diagnosis not present

## 2017-08-18 DIAGNOSIS — I10 Essential (primary) hypertension: Secondary | ICD-10-CM | POA: Diagnosis not present

## 2017-11-04 DIAGNOSIS — H401111 Primary open-angle glaucoma, right eye, mild stage: Secondary | ICD-10-CM | POA: Diagnosis not present

## 2017-11-04 DIAGNOSIS — H401123 Primary open-angle glaucoma, left eye, severe stage: Secondary | ICD-10-CM | POA: Diagnosis not present

## 2017-11-05 DIAGNOSIS — F331 Major depressive disorder, recurrent, moderate: Secondary | ICD-10-CM | POA: Diagnosis not present

## 2017-11-13 DIAGNOSIS — E7800 Pure hypercholesterolemia, unspecified: Secondary | ICD-10-CM | POA: Diagnosis not present

## 2017-11-13 DIAGNOSIS — I10 Essential (primary) hypertension: Secondary | ICD-10-CM | POA: Diagnosis not present

## 2017-11-20 DIAGNOSIS — E7800 Pure hypercholesterolemia, unspecified: Secondary | ICD-10-CM | POA: Diagnosis not present

## 2017-11-20 DIAGNOSIS — E039 Hypothyroidism, unspecified: Secondary | ICD-10-CM | POA: Diagnosis not present

## 2017-11-20 DIAGNOSIS — I10 Essential (primary) hypertension: Secondary | ICD-10-CM | POA: Diagnosis not present

## 2017-11-20 DIAGNOSIS — G2581 Restless legs syndrome: Secondary | ICD-10-CM | POA: Diagnosis not present

## 2018-03-04 DIAGNOSIS — H401123 Primary open-angle glaucoma, left eye, severe stage: Secondary | ICD-10-CM | POA: Diagnosis not present

## 2018-03-04 DIAGNOSIS — H401111 Primary open-angle glaucoma, right eye, mild stage: Secondary | ICD-10-CM | POA: Diagnosis not present

## 2018-03-11 DIAGNOSIS — Z85820 Personal history of malignant melanoma of skin: Secondary | ICD-10-CM | POA: Diagnosis not present

## 2018-03-11 DIAGNOSIS — L570 Actinic keratosis: Secondary | ICD-10-CM | POA: Diagnosis not present

## 2018-03-11 DIAGNOSIS — L723 Sebaceous cyst: Secondary | ICD-10-CM | POA: Diagnosis not present

## 2018-03-11 DIAGNOSIS — Z23 Encounter for immunization: Secondary | ICD-10-CM | POA: Diagnosis not present

## 2018-03-11 DIAGNOSIS — L821 Other seborrheic keratosis: Secondary | ICD-10-CM | POA: Diagnosis not present

## 2018-03-11 DIAGNOSIS — Z85828 Personal history of other malignant neoplasm of skin: Secondary | ICD-10-CM | POA: Diagnosis not present

## 2018-03-11 DIAGNOSIS — D225 Melanocytic nevi of trunk: Secondary | ICD-10-CM | POA: Diagnosis not present

## 2018-03-26 DIAGNOSIS — I10 Essential (primary) hypertension: Secondary | ICD-10-CM | POA: Diagnosis not present

## 2018-03-26 DIAGNOSIS — E039 Hypothyroidism, unspecified: Secondary | ICD-10-CM | POA: Diagnosis not present

## 2018-04-01 DIAGNOSIS — I10 Essential (primary) hypertension: Secondary | ICD-10-CM | POA: Diagnosis not present

## 2018-04-01 DIAGNOSIS — E039 Hypothyroidism, unspecified: Secondary | ICD-10-CM | POA: Diagnosis not present

## 2018-04-01 DIAGNOSIS — E7800 Pure hypercholesterolemia, unspecified: Secondary | ICD-10-CM | POA: Diagnosis not present

## 2018-06-17 ENCOUNTER — Telehealth: Payer: Self-pay

## 2018-06-17 DIAGNOSIS — H401111 Primary open-angle glaucoma, right eye, mild stage: Secondary | ICD-10-CM | POA: Diagnosis not present

## 2018-06-17 DIAGNOSIS — H26493 Other secondary cataract, bilateral: Secondary | ICD-10-CM | POA: Diagnosis not present

## 2018-06-17 DIAGNOSIS — H401123 Primary open-angle glaucoma, left eye, severe stage: Secondary | ICD-10-CM | POA: Diagnosis not present

## 2018-06-17 MED ORDER — VENLAFAXINE HCL 50 MG PO TABS: 50.0000 mg | ORAL_TABLET | Freq: Every day | ORAL | 1 refills | Status: DC

## 2018-06-17 NOTE — Telephone Encounter (Signed)
Received fax from [facility] requesting refill for venlafaxine 50mg  for 90 day supply Escribed.  Pt comes in yearly last office visit 10/2017

## 2018-07-09 DIAGNOSIS — H26491 Other secondary cataract, right eye: Secondary | ICD-10-CM | POA: Diagnosis not present

## 2018-07-23 ENCOUNTER — Encounter: Payer: Self-pay | Admitting: Emergency Medicine

## 2018-07-23 DIAGNOSIS — H26492 Other secondary cataract, left eye: Secondary | ICD-10-CM | POA: Diagnosis not present

## 2018-07-23 DIAGNOSIS — F411 Generalized anxiety disorder: Secondary | ICD-10-CM | POA: Insufficient documentation

## 2018-08-12 DIAGNOSIS — R972 Elevated prostate specific antigen [PSA]: Secondary | ICD-10-CM | POA: Diagnosis not present

## 2018-08-12 DIAGNOSIS — Z0000 Encounter for general adult medical examination without abnormal findings: Secondary | ICD-10-CM | POA: Diagnosis not present

## 2018-08-12 DIAGNOSIS — E039 Hypothyroidism, unspecified: Secondary | ICD-10-CM | POA: Diagnosis not present

## 2018-08-12 DIAGNOSIS — E7800 Pure hypercholesterolemia, unspecified: Secondary | ICD-10-CM | POA: Diagnosis not present

## 2018-08-20 DIAGNOSIS — E039 Hypothyroidism, unspecified: Secondary | ICD-10-CM | POA: Diagnosis not present

## 2018-08-20 DIAGNOSIS — E7800 Pure hypercholesterolemia, unspecified: Secondary | ICD-10-CM | POA: Diagnosis not present

## 2018-08-20 DIAGNOSIS — Z0000 Encounter for general adult medical examination without abnormal findings: Secondary | ICD-10-CM | POA: Diagnosis not present

## 2018-08-20 DIAGNOSIS — R972 Elevated prostate specific antigen [PSA]: Secondary | ICD-10-CM | POA: Diagnosis not present

## 2018-11-04 ENCOUNTER — Ambulatory Visit: Payer: Self-pay | Admitting: Psychiatry

## 2018-12-08 ENCOUNTER — Ambulatory Visit (INDEPENDENT_AMBULATORY_CARE_PROVIDER_SITE_OTHER): Payer: PPO | Admitting: Psychiatry

## 2018-12-08 ENCOUNTER — Encounter: Payer: Self-pay | Admitting: Psychiatry

## 2018-12-08 ENCOUNTER — Other Ambulatory Visit: Payer: Self-pay

## 2018-12-08 DIAGNOSIS — F3342 Major depressive disorder, recurrent, in full remission: Secondary | ICD-10-CM | POA: Diagnosis not present

## 2018-12-08 DIAGNOSIS — F411 Generalized anxiety disorder: Secondary | ICD-10-CM

## 2018-12-08 MED ORDER — VENLAFAXINE HCL 50 MG PO TABS: 50.0000 mg | ORAL_TABLET | Freq: Every day | ORAL | 3 refills | Status: DC

## 2018-12-08 NOTE — Progress Notes (Signed)
Jake Collins [id number] [birth year] 73 y.o.  Subjective:   Patient ID:  Jake Collins is a 73 y.o. (DOB 17-Mar-1946) male.  Chief Complaint:  Chief Complaint  Patient presents with  . Follow-up    depression and anxiety    HPI Jake Collins presents to the office today for follow-up of major depression and GAD.  Good over the last year.  Depression and anxiety under control.  Patient reports stable mood and denies depressed or irritable moods.  Patient denies any recent difficulty with anxiety.  Patient denies difficulty with sleep initiation or maintenance. Denies appetite disturbance.  Patient reports that energy and motivation have been good.  Patient denies any difficulty with concentration.  Patient denies any suicidal ideation.   Past Psychiatric Medication Trials:  Lithium, Lexapro, Zoloft, Effexor XR 150, Klonopin  Review of Systems:  Review of Systems  Neurological: Negative for tremors and weakness.    Medications: I have reviewed the patient's current medications.  Current Outpatient Medications  Medication Sig Dispense Refill  . bisoprolol-hydrochlorothiazide (ZIAC) 2.5-6.25 MG per tablet Take 1 tablet by mouth daily.      . Cholecalciferol (VITAMIN D3) 2000 units TABS Take 2,000 Units by mouth daily.    . Coenzyme Q10 (COQ10) 200 MG CAPS Take 200 mg by mouth every other day.    . dorzolamide (TRUSOPT) 2 % ophthalmic solution Place 1 drop into the left eye 2 (two) times daily.    Marland Kitchen GLUCOSAMINE-CHONDROITIN PO Take 1 tablet by mouth daily.    Marland Kitchen levothyroxine (SYNTHROID, LEVOTHROID) 100 MCG tablet Take 100 mcg by mouth daily.      . Probiotic Product (PROBIOTIC PO) Take 1 tablet by mouth daily.    . Travoprost, BAK Free, (TRAVATAN) 0.004 % SOLN ophthalmic solution Place 1 drop into both eyes at bedtime.      Marland Kitchen venlafaxine (EFFEXOR) 50 MG tablet Take 1 tablet (50 mg total) by mouth daily. 90 tablet 3  . rosuvastatin (CRESTOR) 10 MG tablet Take 10 mg by mouth 3  (three) times a week. (Mondays, Wednesdays, and Fridays)     No current facility-administered medications for this visit.     Medication Side Effects: None, mild sexual SE  Allergies:  Allergies  Allergen Reactions  . Sulfa Antibiotics     Katherina Right syndrome     Past Medical History:  Diagnosis Date  . Cancer ([place])    skin melanoma  . Diverticulitis    2 bouts in 10 years  . Hyperlipemia   . Hypertension   . Hypothyroid   . Thyroid disease     Family History  Problem Relation Age of Onset  . Heart disease Father     Social History   Socioeconomic History  . Marital status: Married    Spouse name: Not on file  . Number of children: Not on file  . Years of education: Not on file  . Highest education level: Not on file  Occupational History  . Not on file  Social Needs  . Financial resource strain: Not on file  . Food insecurity    Worry: Not on file    Inability: Not on file  . Transportation needs    Medical: Not on file    Non-medical: Not on file  Tobacco Use  . Smoking status: Never Smoker  . Smokeless tobacco: Never Used  Substance and Sexual Activity  . Alcohol use: No  . Drug use: No  . Sexual activity: Not on file  Lifestyle  . Physical activity    Days per week: Not on file    Minutes per session: Not on file  . Stress: Not on file  Relationships  . Social Herbalist on phone: Not on file    Gets together: Not on file    Attends religious service: Not on file    Active member of club or organization: Not on file    Attends meetings of clubs or organizations: Not on file    Relationship status: Not on file  . Intimate partner violence    Fear of current or ex partner: Not on file    Emotionally abused: Not on file    Physically abused: Not on file    Forced sexual activity: Not on file  Other Topics Concern  . Not on file  Social History Narrative  . Not on file    Past Medical History, Surgical history, Social  history, and Family history were reviewed and updated as appropriate.   Please see review of systems for further details on the patient's review from today.   Objective:   Physical Exam:  There were no vitals taken for this visit.  Physical Exam Constitutional:      General: He is not in acute distress.    Appearance: He is well-developed.  Musculoskeletal:        General: No deformity.  Neurological:     Mental Status: He is alert and oriented to person, place, and time.     Coordination: Coordination normal.  Psychiatric:        Attention and Perception: Attention and perception normal. He does not perceive auditory or visual hallucinations.        Mood and Affect: Mood normal. Mood is not anxious or depressed. Affect is not labile, blunt, angry or inappropriate.        Speech: Speech normal.        Behavior: Behavior normal.        Thought Content: Thought content normal. Thought content does not include homicidal or suicidal ideation. Thought content does not include homicidal or suicidal plan.        Cognition and Memory: Cognition and memory normal.        Judgment: Judgment normal.     Comments: Insight intact. No delusions.      Lab Review:     Component Value Date/Time   NA 136 06/01/2011 0535   K 3.4 (L) 06/01/2011 0535   CL 101 06/01/2011 0535   CO2 27 06/01/2011 0535   GLUCOSE 97 06/01/2011 0535   BUN 6 06/01/2011 0535   CREATININE 1.03 06/01/2011 0535   CALCIUM 9.0 06/01/2011 0535   PROT 6.4 06/01/2011 0535   ALBUMIN 2.5 (L) 06/01/2011 0535   AST 26 06/01/2011 0535   ALT 26 06/01/2011 0535   ALKPHOS 98 06/01/2011 0535   BILITOT 0.6 06/01/2011 0535   GFRNONAA 73 (L) 06/01/2011 0535   GFRAA 85 (L) 06/01/2011 0535       Component Value Date/Time   WBC 8.9 06/01/2011 0535   RBC 4.39 06/01/2011 0535   HGB 13.3 06/01/2011 0535   HCT 39.0 06/01/2011 0535   PLT 227 06/01/2011 0535   MCV 88.8 06/01/2011 0535   MCH 30.3 06/01/2011 0535   MCHC 34.1  06/01/2011 0535   RDW 12.8 06/01/2011 0535   LYMPHSABS 1.4 05/28/2011 1650   MONOABS 0.7 05/28/2011 1650   EOSABS 0.1 05/28/2011 1650   BASOSABS 0.0 05/28/2011 1650  No results found for: POCLITH, LITHIUM   No results found for: PHENYTOIN, PHENOBARB, VALPROATE, CBMZ   .res Assessment: Plan:    Layn was seen today for follow-up.  Diagnoses and all orders for this visit:  Depression, major, recurrent, in complete remission ([place])  Generalized anxiety disorder  Other orders -     venlafaxine (EFFEXOR) 50 MG tablet; Take 1 tablet (50 mg total) by mouth daily.   No change indicated.  Cont med long term DT high relapse risk.  Has a long history of depression and anxiety.  Has had multiple relapses before.  Been under my care since 2006.  Follow-up 1 year because he is been so stable on this current medication.  We cannot go any lower in the dose.  He is aware he is on a low dose now.  Continue venlafaxine 50 mg daily  Follow-up 1 year  Stephanie Acre, MD, DFAPA  Please see After Visit Summary for patient specific instructions.  No future appointments.  No orders of the defined types were placed in this encounter.   -------------------------------

## 2018-12-10 DIAGNOSIS — E039 Hypothyroidism, unspecified: Secondary | ICD-10-CM | POA: Diagnosis not present

## 2018-12-10 DIAGNOSIS — Z125 Encounter for screening for malignant neoplasm of prostate: Secondary | ICD-10-CM | POA: Diagnosis not present

## 2018-12-10 DIAGNOSIS — I10 Essential (primary) hypertension: Secondary | ICD-10-CM | POA: Diagnosis not present

## 2018-12-10 DIAGNOSIS — E785 Hyperlipidemia, unspecified: Secondary | ICD-10-CM | POA: Diagnosis not present

## 2018-12-10 DIAGNOSIS — Z0000 Encounter for general adult medical examination without abnormal findings: Secondary | ICD-10-CM | POA: Diagnosis not present

## 2018-12-16 DIAGNOSIS — H401111 Primary open-angle glaucoma, right eye, mild stage: Secondary | ICD-10-CM | POA: Diagnosis not present

## 2018-12-16 DIAGNOSIS — H401123 Primary open-angle glaucoma, left eye, severe stage: Secondary | ICD-10-CM | POA: Diagnosis not present

## 2018-12-23 DIAGNOSIS — I10 Essential (primary) hypertension: Secondary | ICD-10-CM | POA: Diagnosis not present

## 2018-12-23 DIAGNOSIS — E785 Hyperlipidemia, unspecified: Secondary | ICD-10-CM | POA: Diagnosis not present

## 2018-12-23 DIAGNOSIS — R972 Elevated prostate specific antigen [PSA]: Secondary | ICD-10-CM | POA: Diagnosis not present

## 2018-12-23 DIAGNOSIS — R05 Cough: Secondary | ICD-10-CM | POA: Diagnosis not present

## 2018-12-23 DIAGNOSIS — E039 Hypothyroidism, unspecified: Secondary | ICD-10-CM | POA: Diagnosis not present

## 2018-12-29 DIAGNOSIS — M25559 Pain in unspecified hip: Secondary | ICD-10-CM | POA: Diagnosis not present

## 2019-01-04 DIAGNOSIS — R05 Cough: Secondary | ICD-10-CM | POA: Diagnosis not present

## 2019-01-07 ENCOUNTER — Other Ambulatory Visit: Payer: Self-pay

## 2019-01-18 DIAGNOSIS — R05 Cough: Secondary | ICD-10-CM | POA: Diagnosis not present

## 2019-01-25 DIAGNOSIS — R05 Cough: Secondary | ICD-10-CM | POA: Diagnosis not present

## 2019-01-26 DIAGNOSIS — R972 Elevated prostate specific antigen [PSA]: Secondary | ICD-10-CM | POA: Diagnosis not present

## 2019-01-26 DIAGNOSIS — N401 Enlarged prostate with lower urinary tract symptoms: Secondary | ICD-10-CM | POA: Diagnosis not present

## 2019-02-02 DIAGNOSIS — R05 Cough: Secondary | ICD-10-CM | POA: Diagnosis not present

## 2019-02-09 DIAGNOSIS — R05 Cough: Secondary | ICD-10-CM | POA: Diagnosis not present

## 2019-02-18 DIAGNOSIS — M25562 Pain in left knee: Secondary | ICD-10-CM | POA: Diagnosis not present

## 2019-02-18 DIAGNOSIS — M1612 Unilateral primary osteoarthritis, left hip: Secondary | ICD-10-CM | POA: Diagnosis not present

## 2019-02-18 DIAGNOSIS — M25552 Pain in left hip: Secondary | ICD-10-CM | POA: Diagnosis not present

## 2019-02-19 DIAGNOSIS — R05 Cough: Secondary | ICD-10-CM | POA: Diagnosis not present

## 2019-02-22 DIAGNOSIS — R05 Cough: Secondary | ICD-10-CM | POA: Diagnosis not present

## 2019-03-01 ENCOUNTER — Telehealth: Payer: Self-pay | Admitting: *Deleted

## 2019-03-01 NOTE — Telephone Encounter (Signed)
    [facility] Pre-operative Risk Assessment    Request for surgical clearance:  1. What type of surgery is being performed? LEFT TOTAL HIP ARTHROPLASTY   2. When is this surgery scheduled? 04/14/19   3. What type of clearance is required (medical clearance vs. Pharmacy clearance to hold med vs. Both)? MEDICAL  4. Are there any medications that need to be held prior to surgery and how long? NONE LISTED    5. Practice name and name of physician performing surgery? [facility]; DR. FRANK ALUISIO   6. What is your office phone number [phone]    7.   What is your office fax number [phone]  8.   Anesthesia type (None, local, MAC, general) ? CHOICE    Julaine Hua 03/01/2019, 10:10 AM  _________________________________________________________________   (provider comments below)

## 2019-03-02 NOTE — Telephone Encounter (Signed)
Called the patient and got him scheduled with Cecilie Kicks, NP on 04/01/19 at 8:30AM. Patient was informed to arrive wearing a mask and to not bring anyone up with him at the time of his appointment. Patient verbalized an understanding. All (if any) questions were answered.

## 2019-03-02 NOTE — Telephone Encounter (Signed)
   Primary Cardiologist:Henry Nicholes Stairs III, MD  Chart reviewed as part of pre-operative protocol coverage. Because of Jake Collins's past medical history and time since last visit, he/she will require a follow-up visit in order to better assess preoperative cardiovascular risk.  Pre-op covering staff: - Please schedule appointment and call patient to inform them. - Please contact requesting surgeon's office via preferred method (i.e, phone, fax) to inform them of need for appointment prior to surgery.  If applicable, this message will also be routed to pharmacy pool and/or primary cardiologist for input on holding anticoagulant/antiplatelet agent as requested below so that this information is available at time of patient's appointment.   [place], [place]  03/02/2019, 9:15 AM

## 2019-03-03 DIAGNOSIS — E039 Hypothyroidism, unspecified: Secondary | ICD-10-CM | POA: Diagnosis not present

## 2019-03-03 DIAGNOSIS — R972 Elevated prostate specific antigen [PSA]: Secondary | ICD-10-CM | POA: Diagnosis not present

## 2019-03-03 DIAGNOSIS — Z01818 Encounter for other preprocedural examination: Secondary | ICD-10-CM | POA: Diagnosis not present

## 2019-03-03 DIAGNOSIS — R05 Cough: Secondary | ICD-10-CM | POA: Diagnosis not present

## 2019-03-03 DIAGNOSIS — Z7189 Other specified counseling: Secondary | ICD-10-CM | POA: Diagnosis not present

## 2019-03-03 DIAGNOSIS — E785 Hyperlipidemia, unspecified: Secondary | ICD-10-CM | POA: Diagnosis not present

## 2019-03-15 DIAGNOSIS — R05 Cough: Secondary | ICD-10-CM | POA: Diagnosis not present

## 2019-03-18 DIAGNOSIS — M1612 Unilateral primary osteoarthritis, left hip: Secondary | ICD-10-CM | POA: Diagnosis not present

## 2019-03-18 DIAGNOSIS — Z0000 Encounter for general adult medical examination without abnormal findings: Secondary | ICD-10-CM | POA: Diagnosis not present

## 2019-03-18 DIAGNOSIS — I10 Essential (primary) hypertension: Secondary | ICD-10-CM | POA: Diagnosis not present

## 2019-03-22 DIAGNOSIS — R05 Cough: Secondary | ICD-10-CM | POA: Diagnosis not present

## 2019-03-25 DIAGNOSIS — L821 Other seborrheic keratosis: Secondary | ICD-10-CM | POA: Diagnosis not present

## 2019-03-25 DIAGNOSIS — Z23 Encounter for immunization: Secondary | ICD-10-CM | POA: Diagnosis not present

## 2019-03-25 DIAGNOSIS — L570 Actinic keratosis: Secondary | ICD-10-CM | POA: Diagnosis not present

## 2019-03-25 DIAGNOSIS — D225 Melanocytic nevi of trunk: Secondary | ICD-10-CM | POA: Diagnosis not present

## 2019-03-25 DIAGNOSIS — L723 Sebaceous cyst: Secondary | ICD-10-CM | POA: Diagnosis not present

## 2019-03-25 DIAGNOSIS — Z85820 Personal history of malignant melanoma of skin: Secondary | ICD-10-CM | POA: Diagnosis not present

## 2019-03-25 DIAGNOSIS — L820 Inflamed seborrheic keratosis: Secondary | ICD-10-CM | POA: Diagnosis not present

## 2019-03-25 DIAGNOSIS — Z85828 Personal history of other malignant neoplasm of skin: Secondary | ICD-10-CM | POA: Diagnosis not present

## 2019-03-25 DIAGNOSIS — I781 Nevus, non-neoplastic: Secondary | ICD-10-CM | POA: Diagnosis not present

## 2019-03-26 DIAGNOSIS — R972 Elevated prostate specific antigen [PSA]: Secondary | ICD-10-CM | POA: Diagnosis not present

## 2019-03-26 DIAGNOSIS — E785 Hyperlipidemia, unspecified: Secondary | ICD-10-CM | POA: Diagnosis not present

## 2019-03-29 DIAGNOSIS — R05 Cough: Secondary | ICD-10-CM | POA: Diagnosis not present

## 2019-03-31 NOTE — Progress Notes (Signed)
Cardiology Office Note   Date:  04/01/2019   ID:  Jake Collins 01-23-1946, MRN [id number]  PCP:  Jani Gravel, MD  Cardiologist:  Dr. Tamala Julian     Chief Complaint  Patient presents with  . Pre-op Exam      History of Present Illness: Jake Collins is a 73 y.o. male who presents for pre-op eval.  Lt total hip   He has a history of hypertension, hyperlipidemia, prior concern for dilated aorta that ended up being an erroneous diagnosis, and hypothyroidism.  Last visit with Dr. Tamala Julian was in 2018.  He had some fatigue at that time and had tests ordered.  Normal ETT in 2018 and normal Echo.  He has been following up PRN but now for surgery.    He feels great today except for some Lt hip pain.  He is active, no longer running but can climb steps without chest pain or SOB.  He is on Pravachol followed by his PCP - intolerance to many statins due to memory issues.  BP is controlled.    Past Medical History:  Diagnosis Date  . Cancer ([place])    skin melanoma  . Diverticulitis    2 bouts in 10 years  . Hyperlipemia   . Hypertension   . Hypothyroid   . Thyroid disease     Past Surgical History:  Procedure Laterality Date  . HERNIA REPAIR    . KNEE ARTHROSCOPY     Left  . LAPAROSCOPIC APPENDECTOMY  05/28/2011   Procedure: APPENDECTOMY LAPAROSCOPIC;  Surgeon: Edward Jolly, MD;  Location: [facility];  Service: General;  Laterality: N/A;  . ROTATOR CUFF REPAIR     Left side  . SALIVARY GLAND SURGERY     L side  . SKIN CANCER EXCISION     on his back and was benign     Current Outpatient Medications  Medication Sig Dispense Refill  . cholecalciferol (VITAMIN D3) 25 MCG (1000 UT) tablet Take 1,000 Units by mouth daily.    . Cyanocobalamin (B-12) 5000 MCG CAPS Take 5,000 mcg by mouth daily.    . dorzolamide (TRUSOPT) 2 % ophthalmic solution Place 1 drop into the left eye 2 (two) times daily.    Marland Kitchen EPINEPHrine 0.3 mg/0.3 mL IJ SOAJ injection Inject 0.3 mg into the  muscle as needed for anaphylaxis.    Marland Kitchen glucosamine-chondroitin 500-400 MG tablet Take 1 tablet by mouth daily.    Marland Kitchen latanoprost (XALATAN) 0.005 % ophthalmic solution Place 1 drop into both eyes at bedtime.    Marland Kitchen levothyroxine (SYNTHROID, LEVOTHROID) 100 MCG tablet Take 100 mcg by mouth daily.      . naproxen sodium (ALEVE) 220 MG tablet Take 440 mg by mouth daily.    . nebivolol (BYSTOLIC) 5 MG tablet Take 5 mg by mouth daily.    . pravastatin (PRAVACHOL) 10 MG tablet Take 10 mg by mouth every Monday, Wednesday, and Friday.    . Probiotic Product (PROBIOTIC PO) Take 1 tablet by mouth daily.    Marland Kitchen pyridOXINE (VITAMIN B-6) 100 MG tablet Take 100 mg by mouth daily.    Marland Kitchen venlafaxine (EFFEXOR) 50 MG tablet Take 1 tablet (50 mg total) by mouth daily. 90 tablet 3   No current facility-administered medications for this visit.     Allergies:   Sulfa antibiotics    Social History:  The patient  reports that he has never smoked. He has never used smokeless tobacco. He reports that he  does not drink alcohol or use drugs.   Family History:  The patient's family history includes Heart disease in his father.  Father died at 46 with MI    ROS:  General:no colds or fevers, no weight changes Skin:no rashes or ulcers HEENT:no blurred vision, no congestion CV:see HPI PUL:see HPI GI:no diarrhea constipation or melena, no indigestion GU:no hematuria, no dysuria MS:no joint pain, no claudication Neuro:no syncope, no lightheadedness Endo:no diabetes, + thyroid disease followed by PCP  Wt Readings from Last 3 Encounters:  04/01/19 183 lb 3.2 oz (83.1 kg)  07/01/16 177 lb (80.3 kg)  06/20/11 180 lb 3.2 oz (81.7 kg)     PHYSICAL EXAM: VS:  BP 140/72   Pulse 65   Ht 5\' 10"  (1.778 m)   Wt 183 lb 3.2 oz (83.1 kg)   SpO2 99%   BMI 26.29 kg/m  , BMI Body mass index is 26.29 kg/m. General:Pleasant affect, NAD Skin:Warm and dry, brisk capillary refill HEENT:normocephalic, sclera clear, mucus membranes  moist Neck:supple, no JVD, no bruits  Heart:S1S2 RRR without murmur, gallup, rub or click Lungs:clear without rales, rhonchi, or wheezes [id number], non tender, + BS, do not palpate liver spleen or masses Ext:no lower ext edema, 2+ pedal pulses, 2+ radial pulses Neuro:alert and oriented X 3, MAE, follows commands, + facial symmetry    EKG:  EKG is ordered today. The ekg ordered today demonstrates SR with incomplete RBBB, LAFB, voltage criteria for LVH but no changes since 07-01-26.     Recent Labs: No results found for requested labs within last 8760 hours.    Lipid Panel No results found for: CHOL, TRIG, HDL, CHOLHDL, VLDL, LDLCALC, LDLDIRECT     Other studies Reviewed: Additional studies/ records that were reviewed today include: . Echo 07/04/16 Study Conclusions  - Left ventricle: The cavity size was normal. Wall thickness was   increased in a pattern of mild LVH. Systolic function was normal.   The estimated ejection fraction was in the range of 55% to 60%.   Left ventricular diastolic function parameters were normal. - Aortic valve: There was mild regurgitation. - Mitral valve: There was mild regurgitation.  ETT 07/04/16  Blood pressure demonstrated a normal response to exercise.  There was no ST segment deviation noted during stress.   Normal exercise treadmill stress test. No ischemia. Excellent exercise tolerance. Normal BP response to stress.     ASSESSMENT AND PLAN:  1.  Pre- op exam.    Pt seen and examined. Given past medical history based on ACC/AHA guidelines, LEBRANDON HUSKA would be at acceptable risk for the planned procedure without further cardiovascular testing.  He easily meets 4 METS without issues. No prior hx of CAD and last stress test 2018 was normal.   I will route this recommendation to the requesting party via [facility] fax function and remove from pre-op pool.  2.  HTN controlled followed by PCP  3.  HLD followed by PCP and on statin.   4.  Chronic LBBB  He will follow up with Dr. Tamala Julian as needed.   Current medicines are reviewed with the patient today.  The patient Has no concerns regarding medicines.  The following changes have been made:  See above Labs/ tests ordered today include:see above  Disposition:   FU:  see above  Signed, Cecilie Kicks, NP  04/01/2019 8:43 AM    [place], [place], [place],  [place] Phone: [phone]; Fax: [phone]

## 2019-04-01 ENCOUNTER — Ambulatory Visit: Payer: PPO | Admitting: Cardiology

## 2019-04-01 ENCOUNTER — Other Ambulatory Visit: Payer: Self-pay

## 2019-04-01 ENCOUNTER — Encounter: Payer: Self-pay | Admitting: Cardiology

## 2019-04-01 VITALS — BP 140/72 | HR 65 | Ht 70.0 in | Wt 183.2 lb

## 2019-04-01 DIAGNOSIS — I447 Left bundle-branch block, unspecified: Secondary | ICD-10-CM

## 2019-04-01 DIAGNOSIS — E782 Mixed hyperlipidemia: Secondary | ICD-10-CM | POA: Diagnosis not present

## 2019-04-01 DIAGNOSIS — Z01818 Encounter for other preprocedural examination: Secondary | ICD-10-CM | POA: Diagnosis not present

## 2019-04-01 DIAGNOSIS — I119 Hypertensive heart disease without heart failure: Secondary | ICD-10-CM

## 2019-04-01 NOTE — Patient Instructions (Signed)
DUE TO COVID-19 ONLY ONE VISITOR IS ALLOWED TO COME WITH YOU AND STAY IN THE WAITING ROOM ONLY DURING PRE OP AND PROCEDURE DAY OF SURGERY. THE 1 VISITOR MAY VISIT WITH YOU AFTER SURGERY IN YOUR PRIVATE ROOM DURING VISITING HOURS ONLY!  YOU NEED TO HAVE A COVID 19 TEST ON_______ @_______ , THIS TEST MUST BE DONE BEFORE SURGERY, COME  [address], [address] [place] , [address].  ([place]) ONCE YOUR COVID TEST IS COMPLETED, PLEASE BEGIN THE QUARANTINE INSTRUCTIONS AS OUTLINED IN YOUR HANDOUT.                Jake Collins  04/01/2019   Your procedure is scheduled on: 04-14-19   Report to [facility] Main  Entrance   Report to admitting at        100 PM     Call this number if you have problems the morning of surgery [phone]    Remember: NO SOLID FOOD AFTER MIDNIGHT THE NIGHT PRIOR TO SURGERY. NOTHING BY MOUTH EXCEPT CLEAR LIQUIDS UNTIL     1230 pm . PLEASE FINISH ENSURE DRINK PER SURGEON ORDER  WHICH NEEDS TO BE COMPLETED AT   1230 pm then nothing by mouth .    CLEAR LIQUID DIET   Foods Allowed                                                                     Foods Excluded  Coffee and tea, regular and decaf                             liquids that you cannot  Plain Jell-O any favor except red or purple                                           see through such as: Fruit ices (not with fruit pulp)                                     milk, soups, orange juice  Iced Popsicles                                    All solid food Carbonated beverages, regular and diet                                    Cranberry, grape and apple juices Sports drinks like Gatorade Lightly seasoned clear broth or consume(fat free) Sugar, honey syrup  Sample Menu Breakfast                                Lunch  Supper Cranberry juice                    Beef broth                            Chicken broth Jell-O                                      Grape juice                           Apple juice Coffee or tea                        Jell-O                                      Popsicle                                                Coffee or tea                        Coffee or tea  _____________________________________________________________________     BRUSH YOUR TEETH MORNING OF SURGERY AND RINSE YOUR MOUTH OUT, NO CHEWING GUM CANDY OR MINTS.     Take these medicines the morning of surgery with A SIP OF WATER: effexor, prevastatin, bystolic, levothyroxine, eye drops as usual                                 You may not have any metal on your body including hair pins and              piercings  Do not wear jewelry, , lotions, powders or perfumes, deodorant                       Men may shave face and neck.   Do not bring valuables to the hospital. [place].  Contacts, dentures or bridgework may not be worn into surgery.               Please read over the following fact sheets you were given: _____________________________________________________________________          [facility] - Preparing for Surgery Before surgery, you can play an important role.  Because skin is not sterile, your skin needs to be as free of germs as possible.  You can reduce the number of germs on your skin by washing with CHG (chlorahexidine gluconate) soap before surgery.  CHG is an antiseptic cleaner which kills germs and bonds with the skin to continue killing germs even after washing. Please DO NOT use if you have an allergy to CHG or antibacterial soaps.  If your skin becomes reddened/irritated stop using the CHG and inform your nurse when you arrive at Short Stay. Do not shave (including legs and underarms) for at least 48 hours  prior to the first CHG shower.  You may shave your face/neck. Please follow these instructions carefully:  1.  Shower with CHG Soap the night before surgery and the   morning of Surgery.  2.  If you choose to wash your hair, wash your hair first as usual with your  normal  shampoo.  3.  After you shampoo, rinse your hair and body thoroughly to remove the  shampoo.                           4.  Use CHG as you would any other liquid soap.  You can apply chg directly  to the skin and wash                       Gently with a scrungie or clean washcloth.  5.  Apply the CHG Soap to your body ONLY FROM THE NECK DOWN.   Do not use on face/ open                           Wound or open sores. Avoid contact with eyes, ears mouth and genitals (private parts).                       Wash face,  Genitals (private parts) with your normal soap.             6.  Wash thoroughly, paying special attention to the area where your surgery  will be performed.  7.  Thoroughly rinse your body with warm water from the neck down.  8.  DO NOT shower/wash with your normal soap after using and rinsing off  the CHG Soap.                9.  Pat yourself dry with a clean towel.            10.  Wear clean pajamas.            11.  Place clean sheets on your bed the night of your first shower and do not  sleep with pets. Day of Surgery : Do not apply any lotions/deodorants the morning of surgery.  Please wear clean clothes to the hospital/surgery center.  FAILURE TO FOLLOW THESE INSTRUCTIONS MAY RESULT IN THE CANCELLATION OF YOUR SURGERY PATIENT SIGNATURE_________________________________  NURSE SIGNATURE__________________________________  ________________________________________________________________________   Jake Collins  An incentive spirometer is a tool that can help keep your lungs clear and active. This tool measures how well you are filling your lungs with each breath. Taking long deep breaths may help reverse or decrease the chance of developing breathing (pulmonary) problems (especially infection) following:  A long period of time when you are unable to move or be  active. BEFORE THE PROCEDURE   If the spirometer includes an indicator to show your best effort, your nurse or respiratory therapist will set it to a desired goal.  If possible, sit up straight or lean slightly forward. Try not to slouch.  Hold the incentive spirometer in an upright position. INSTRUCTIONS FOR USE  1. Sit on the edge of your bed if possible, or sit up as far as you can in bed or on a chair. 2. Hold the incentive spirometer in an upright position. 3. Breathe out normally. 4. Place the mouthpiece in your mouth and seal your lips tightly around it. 5. Breathe  in slowly and as deeply as possible, raising the piston or the ball toward the top of the column. 6. Hold your breath for 3-5 seconds or for as long as possible. Allow the piston or ball to fall to the bottom of the column. 7. Remove the mouthpiece from your mouth and breathe out normally. 8. Rest for a few seconds and repeat Steps 1 through 7 at least 10 times every 1-2 hours when you are awake. Take your time and take a few normal breaths between deep breaths. 9. The spirometer may include an indicator to show your best effort. Use the indicator as a goal to work toward during each repetition. 10. After each set of 10 deep breaths, practice coughing to be sure your lungs are clear. If you have an incision (the cut made at the time of surgery), support your incision when coughing by placing a pillow or rolled up towels firmly against it. Once you are able to get out of bed, walk around indoors and cough well. You may stop using the incentive spirometer when instructed by your caregiver.  RISKS AND COMPLICATIONS  Take your time so you do not get dizzy or light-headed.  If you are in pain, you may need to take or ask for pain medication before doing incentive spirometry. It is harder to take a deep breath if you are having pain. AFTER USE  Rest and breathe slowly and easily.  It can be helpful to keep track of a log of  your progress. Your caregiver can provide you with a simple table to help with this. If you are using the spirometer at home, follow these instructions: [place] IF:   You are having difficultly using the spirometer.  You have trouble using the spirometer as often as instructed.  Your pain medication is not giving enough relief while using the spirometer.  You develop fever of 100.5 F (38.1 C) or higher. SEEK IMMEDIATE MEDICAL CARE IF:   You cough up bloody sputum that had not been present before.  You develop fever of 102 F (38.9 C) or greater.  You develop worsening pain at or near the incision site. MAKE SURE YOU:   Understand these instructions.  Will watch your condition.  Will get help right away if you are not doing well or get worse. Document Released: 10/07/2006 Document Revised: 08/19/2011 Document Reviewed: 12/08/2006 ExitCare Patient Information 2014 ExitCare, [place].   ________________________________________________________________________  WHAT IS A BLOOD TRANSFUSION? Blood Transfusion Information  A transfusion is the replacement of blood or some of its parts. Blood is made up of multiple cells which provide different functions.  Red blood cells carry oxygen and are used for blood loss replacement.  White blood cells fight against infection.  Platelets control bleeding.  Plasma helps clot blood.  Other blood products are available for specialized needs, such as hemophilia or other clotting disorders. BEFORE THE TRANSFUSION  Who gives blood for transfusions?   Healthy volunteers who are fully evaluated to make sure their blood is safe. This is blood bank blood. Transfusion therapy is the safest it has ever been in the practice of medicine. Before blood is taken from a donor, a complete history is taken to make sure that person has no history of diseases nor engages in risky social behavior (examples are intravenous drug use or sexual activity  with multiple partners). The donor's travel history is screened to minimize risk of transmitting infections, such as malaria. The donated blood is tested  for signs of infectious diseases, such as HIV and hepatitis. The blood is then tested to be sure it is compatible with you in order to minimize the chance of a transfusion reaction. If you or a relative donates blood, this is often done in anticipation of surgery and is not appropriate for emergency situations. It takes many days to process the donated blood. RISKS AND COMPLICATIONS Although transfusion therapy is very safe and saves many lives, the main dangers of transfusion include:   Getting an infectious disease.  Developing a transfusion reaction. This is an allergic reaction to something in the blood you were given. Every precaution is taken to prevent this. The decision to have a blood transfusion has been considered carefully by your caregiver before blood is given. Blood is not given unless the benefits outweigh the risks. AFTER THE TRANSFUSION  Right after receiving a blood transfusion, you will usually feel much better and more energetic. This is especially true if your red blood cells have gotten low (anemic). The transfusion raises the level of the red blood cells which carry oxygen, and this usually causes an energy increase.  The nurse administering the transfusion will monitor you carefully for complications. HOME CARE INSTRUCTIONS  No special instructions are needed after a transfusion. You may find your energy is better. Speak with your caregiver about any limitations on activity for underlying diseases you may have. SEEK MEDICAL CARE IF:   Your condition is not improving after your transfusion.  You develop redness or irritation at the intravenous (IV) site. SEEK IMMEDIATE MEDICAL CARE IF:  Any of the following symptoms occur over the next 12 hours:  Shaking chills.  You have a temperature by mouth above 102 F (38.9  C), not controlled by medicine.  Chest, back, or muscle pain.  People around you feel you are not acting correctly or are confused.  Shortness of breath or difficulty breathing.  Dizziness and fainting.  You get a rash or develop hives.  You have a decrease in urine output.  Your urine turns a dark color or changes to pink, red, or brown. Any of the following symptoms occur over the next 10 days:  You have a temperature by mouth above 102 F (38.9 C), not controlled by medicine.  Shortness of breath.  Weakness after normal activity.  The white part of the eye turns yellow (jaundice).  You have a decrease in the amount of urine or are urinating less often.  Your urine turns a dark color or changes to pink, red, or brown. Document Released: 05/24/2000 Document Revised: 08/19/2011 Document Reviewed: 01/11/2008 [facility] Patient Information 2014 [place], [place].  _______________________________________________________________________

## 2019-04-01 NOTE — Patient Instructions (Signed)
Medication Instructions:   Your physician recommends that you continue on your current medications as directed. Please refer to the Current Medication list given to you today.  *If you need a refill on your cardiac medications before your next appointment, please call your pharmacy*  Lab Work:  None ordered today  Testing/Procedures:  None ordered today  Follow-Up: At [facility], you and your health needs are our priority.  As part of our continuing mission to provide you with exceptional heart care, we have created designated Provider Care Teams.  These Care Teams include your primary Cardiologist (physician) and Advanced Practice Providers (APPs -  Physician Assistants and Nurse Practitioners) who all work together to provide you with the care you need, when you need it.  Your next appointment:    Follow up as needed  Provider:   Daneen Schick, MD

## 2019-04-01 NOTE — Progress Notes (Addendum)
PCP - Jani Gravel Clearance by Fredda Hammed 03-03-19 on chart  Cardiologist - Daneen Schick Clearance by Cecilie Kicks   NP   04-01-19 epic  Chest x-ray -  EKG - done 04-01-19 epic Stress Test - 06-2016 epic ECHO - 06-2016 epic Cardiac Cath -   Sleep Study -  CPAP -   Fasting Blood Sugar -  Checks Blood Sugar _____ times a day  Blood Thinner Instructions: Aspirin Instructions: Last Dose:  Anesthesia review:  Moderate risk for surgery  With clearance  Patient denies shortness of breath, fever, cough and chest pain at PAT appointment     NONE   Patient verbalized understanding of instructions that were given to them at the PAT appointment. Patient was also instructed that they will need to review over the PAT instructions again at home before surgery.

## 2019-04-05 DIAGNOSIS — R05 Cough: Secondary | ICD-10-CM | POA: Diagnosis not present

## 2019-04-06 ENCOUNTER — Encounter (HOSPITAL_COMMUNITY)
Admission: RE | Admit: 2019-04-06 | Discharge: 2019-04-06 | Disposition: A | Discharge status: Home / Self Care | Payer: PPO | Source: Ambulatory Visit | Attending: Orthopedic Surgery | Admitting: Orthopedic Surgery

## 2019-04-06 ENCOUNTER — Other Ambulatory Visit: Payer: Self-pay

## 2019-04-06 ENCOUNTER — Encounter (HOSPITAL_COMMUNITY): Payer: Self-pay

## 2019-04-06 DIAGNOSIS — M1612 Unilateral primary osteoarthritis, left hip: Secondary | ICD-10-CM | POA: Diagnosis not present

## 2019-04-06 DIAGNOSIS — I10 Essential (primary) hypertension: Secondary | ICD-10-CM | POA: Diagnosis not present

## 2019-04-06 DIAGNOSIS — Z01812 Encounter for preprocedural laboratory examination: Secondary | ICD-10-CM | POA: Diagnosis not present

## 2019-04-06 HISTORY — DX: Scoliosis, unspecified: M41.9

## 2019-04-06 HISTORY — DX: Unspecified osteoarthritis, unspecified site: M19.90

## 2019-04-06 LAB — CBC WITH DIFFERENTIAL/PLATELET
Abs Immature Granulocytes: 0.01 10*3/uL (ref 0.00–0.07)
Basophils Absolute: 0.1 10*3/uL (ref 0.0–0.1)
Basophils Relative: 1 %
Eosinophils Absolute: 0.4 10*3/uL (ref 0.0–0.5)
Eosinophils Relative: 6 %
HCT: 47.9 % (ref 39.0–52.0)
Hemoglobin: 15.9 g/dL (ref 13.0–17.0)
Immature Granulocytes: 0 %
Lymphocytes Relative: 24 %
Lymphs Abs: 1.5 10*3/uL (ref 0.7–4.0)
MCH: 31 pg (ref 26.0–34.0)
MCHC: 33.2 g/dL (ref 30.0–36.0)
MCV: 93.4 fL (ref 80.0–100.0)
Monocytes Absolute: 0.7 10*3/uL (ref 0.1–1.0)
Monocytes Relative: 10 %
Neutro Abs: 3.7 10*3/uL (ref 1.7–7.7)
Neutrophils Relative %: 59 %
Platelets: 193 10*3/uL (ref 150–400)
RBC: 5.13 MIL/uL (ref 4.22–5.81)
RDW: 12.4 % (ref 11.5–15.5)
WBC: 6.3 10*3/uL (ref 4.0–10.5)
nRBC: 0 % (ref 0.0–0.2)

## 2019-04-06 LAB — PROTIME-INR
INR: 1 (ref 0.8–1.2)
Prothrombin Time: 13.3 seconds (ref 11.4–15.2)

## 2019-04-06 LAB — COMPREHENSIVE METABOLIC PANEL
ALT: 24 U/L (ref 0–44)
AST: 26 U/L (ref 15–41)
Albumin: 4.2 g/dL (ref 3.5–5.0)
Alkaline Phosphatase: 62 U/L (ref 38–126)
Anion gap: 9 (ref 5–15)
BUN: 21 mg/dL (ref 8–23)
CO2: 24 mmol/L (ref 22–32)
Calcium: 9.3 mg/dL (ref 8.9–10.3)
Chloride: 106 mmol/L (ref 98–111)
Creatinine, Ser: 1.09 mg/dL (ref 0.61–1.24)
GFR calc Af Amer: >60 mL/min (ref >60)
GFR calc non Af Amer: >60 mL/min (ref >60)
Glucose, Bld: 96 mg/dL (ref 70–99)
Potassium: 4.5 mmol/L (ref 3.5–5.1)
Sodium: 139 mmol/L (ref 135–145)
Total Bilirubin: 0.5 mg/dL (ref 0.3–1.2)
Total Protein: 7 g/dL (ref 6.5–8.1)

## 2019-04-06 LAB — ABO/RH: ABO/RH(D): O NEG

## 2019-04-06 LAB — APTT: aPTT: 30 seconds (ref 24–36)

## 2019-04-06 LAB — SURGICAL PCR SCREEN
MRSA, PCR: NEGATIVE
Staphylococcus aureus: NEGATIVE

## 2019-04-10 ENCOUNTER — Other Ambulatory Visit (HOSPITAL_COMMUNITY)
Admission: RE | Admit: 2019-04-10 | Discharge: 2019-04-10 | Disposition: A | Discharge status: Home / Self Care | Payer: PPO | Source: Ambulatory Visit | Attending: Orthopedic Surgery | Admitting: Orthopedic Surgery

## 2019-04-10 DIAGNOSIS — Z20828 Contact with and (suspected) exposure to other viral communicable diseases: Secondary | ICD-10-CM | POA: Diagnosis not present

## 2019-04-10 DIAGNOSIS — Z01812 Encounter for preprocedural laboratory examination: Secondary | ICD-10-CM | POA: Diagnosis not present

## 2019-04-11 LAB — NOVEL CORONAVIRUS, NAA (HOSP ORDER, SEND-OUT TO REF LAB; TAT 18-24 HRS): SARS-CoV-2, NAA: NOT DETECTED

## 2019-04-12 NOTE — H&P (Signed)
TOTAL HIP ADMISSION H&P  Patient is admitted for left total hip arthroplasty.  Subjective:  Chief Complaint: left hip pain  HPI: Jake Collins, 73 y.o. male, has a history of pain and functional disability in the left hip(s) due to arthritis and patient has failed non-surgical conservative treatments for greater than 12 weeks to include NSAID's and/or analgesics, corticosteriod injections, flexibility and strengthening excercises and activity modification.  Onset of symptoms was gradual starting 6 years ago with gradually worsening course since that time.The patient noted no past surgery on the left hip(s).  Patient currently rates pain in the left hip at 7 out of 10 with activity. Patient has night pain, worsening of pain with activity and weight bearing, pain that interfers with activities of daily living and pain with passive range of motion. Patient has evidence of periarticular osteophytes and joint space narrowing by imaging studies. This condition presents safety issues increasing the risk of falls. There is no current active infection.  Patient Active Problem List   Diagnosis Date Noted  . GAD (generalized anxiety disorder) 07/23/2018  . Bifascicular block 07/01/2016  . Hypertensive heart disease without heart failure 07/01/2016  . Aortic dilatation ([place]) 06/30/2016  . Glaucoma 05/29/2011  . Hypothyroid 05/29/2011  . Dyslipidemia 05/29/2011  . Depression 05/29/2011  . Appendicitis, acute 05/28/2011   Past Medical History:  Diagnosis Date  . Arthritis   . Cancer ([place])    skin melanoma  . Diverticulitis    2 bouts in 10 years  . Hyperlipemia   . Hypertension   . Hypothyroid   . Scoliosis   . Thyroid disease     Past Surgical History:  Procedure Laterality Date  . HERNIA REPAIR    . KNEE ARTHROSCOPY      x2  . LAPAROSCOPIC APPENDECTOMY  05/28/2011   Procedure: APPENDECTOMY LAPAROSCOPIC;  Surgeon: Edward Jolly, MD;  Location: [facility];  Service: General;   Laterality: N/A;  . ROTATOR CUFF REPAIR     bil   . SALIVARY GLAND SURGERY     L side  . SKIN CANCER EXCISION     on his back and was benign       Current Outpatient Medications  Medication Sig Dispense Refill Last Dose  . cholecalciferol (VITAMIN D3) 25 MCG (1000 UT) tablet Take 1,000 Units by mouth daily.     . Cyanocobalamin (B-12) 5000 MCG CAPS Take 5,000 mcg by mouth daily.     . dorzolamide (TRUSOPT) 2 % ophthalmic solution Place 1 drop into the left eye 2 (two) times daily.     Marland Kitchen EPINEPHrine 0.3 mg/0.3 mL IJ SOAJ injection Inject 0.3 mg into the muscle as needed for anaphylaxis.     Marland Kitchen glucosamine-chondroitin 500-400 MG tablet Take 1 tablet by mouth daily.     Marland Kitchen latanoprost (XALATAN) 0.005 % ophthalmic solution Place 1 drop into both eyes at bedtime.     Marland Kitchen levothyroxine (SYNTHROID, LEVOTHROID) 100 MCG tablet Take 100 mcg by mouth daily.       . naproxen sodium (ALEVE) 220 MG tablet Take 440 mg by mouth daily.     . nebivolol (BYSTOLIC) 5 MG tablet Take 5 mg by mouth daily.     . pravastatin (PRAVACHOL) 10 MG tablet Take 10 mg by mouth every Monday, Wednesday, and Friday.     . Probiotic Product (PROBIOTIC PO) Take 1 tablet by mouth daily.     Marland Kitchen pyridOXINE (VITAMIN B-6) 100 MG tablet Take 100 mg by mouth daily.     Marland Kitchen  venlafaxine (EFFEXOR) 50 MG tablet Take 1 tablet (50 mg total) by mouth daily. 90 tablet 3    Allergies  Allergen Reactions  . Sulfa Antibiotics     Katherina Right syndrome     Social History   Tobacco Use  . Smoking status: Never Smoker  . Smokeless tobacco: Never Used  Substance Use Topics  . Alcohol use: No    Family History  Problem Relation Age of Onset  . Heart disease Father      Review of Systems  Constitutional: Negative.   HENT: Negative.   Eyes: Negative.   Respiratory: Negative.   Cardiovascular: Negative.   Gastrointestinal: Negative.   Genitourinary: Negative.   Musculoskeletal: Positive for joint pain and myalgias. Negative for back  pain, falls and neck pain.  Skin: Negative.   Neurological: Negative.   Endo/Heme/Allergies: Negative.   Psychiatric/Behavioral: Negative.     Objective:  Physical Exam  Constitutional: He appears well-developed and well-nourished. No distress.  HENT:  Head: Normocephalic and atraumatic.  Right Ear: External ear normal.  Nose: Nose normal.  Mouth/Throat: Oropharynx is clear and moist.  Eyes: Conjunctivae and EOM are normal.  Neck: Normal range of motion. Neck supple.  Cardiovascular: Normal rate, regular rhythm, normal heart sounds and intact distal pulses.  No murmur heard. Respiratory: Effort normal and breath sounds normal. No respiratory distress. He has no wheezes.  GI: Soft. Bowel sounds are normal. He exhibits no distension. There is no abdominal tenderness.  Musculoskeletal:     Comments: Slightly antalgic gait to the left.  Right Hip Exam: ROM: Normal without discomfort. There is no tenderness over the greater trochanter.  Right Knee Exam: No effusion. Range of motion is 0-125 degrees. No crepitus on range of motion of the knee. No medial or lateral joint line tenderness. Stable knee.  Left Hip Exam: ROM: Flexion to 120, Internal Rotation is minimal, External Rotation 30, and Abduction 30 degrees. There is no tenderness over the greater trochanter.  Left Knee Exam: No effusion. Range of motion is 0-135 degrees. Minimal crepitus on range of motion of the knee. No medial or lateral joint line tenderness. Stable knee.  Neurological: He is alert. He has normal strength. No sensory deficit.  Skin: No rash noted. He is not diaphoretic. No erythema.  Psychiatric: He has a normal mood and affect. His behavior is normal.    Vital Signs Ht: 5 ft 10 in  Wt: 176.8 lbs  BMI: 25.4  BP: 142/72 sitting L arm  Pulse: 76 bpm    Imaging Review Plain radiographs demonstrate severe degenerative joint disease of the left hip(s). The bone quality appears to be good for  age and reported activity level.    Assessment/Plan:  End stage primary osteoarthritis, left hip(s)  The patient history, physical examination, clinical judgement of the provider and imaging studies are consistent with end stage degenerative joint disease of the left hip(s) and total hip arthroplasty is deemed medically necessary. The treatment options including medical management, injection therapy, arthroscopy and arthroplasty were discussed at length. The risks and benefits of total hip arthroplasty were presented and reviewed. The risks due to aseptic loosening, infection, stiffness, dislocation/subluxation,  thromboembolic complications and other imponderables were discussed.  The patient acknowledged the explanation, agreed to proceed with the plan and consent was signed. Patient is being admitted for inpatient treatment for surgery, pain control, PT, OT, prophylactic antibiotics, VTE prophylaxis, progressive ambulation and ADL's and discharge planning.The patient is planning to be discharged home with HEP.  Risks and benefits of the surgery were discussed with the patient and Dr.Aluisio at their previous office visit, and the patient has elected to move forward with the aforementioned surgery. Post-operative care plans were discussed with the patient today.   Therapy Plans: HEP Disposition: Home with wife Planned DVT prophylaxis: aspirin 325mg  BID DME needed: may have walker and 3-n-1; will let us know PCP: Dr. Maudie Mercury- clearance received Cardio: Dr. Mosetta Anis received Other: no anesthesia concerns; has mild scoliosis  Instructed patient on meds to stop prior to surgery  Ardeen Jourdain, PA-C

## 2019-04-14 ENCOUNTER — Inpatient Hospital Stay (HOSPITAL_COMMUNITY): Payer: PPO

## 2019-04-14 ENCOUNTER — Encounter (HOSPITAL_COMMUNITY): Payer: Self-pay | Admitting: *Deleted

## 2019-04-14 ENCOUNTER — Inpatient Hospital Stay (HOSPITAL_COMMUNITY): Payer: PPO | Admitting: Vascular Surgery

## 2019-04-14 ENCOUNTER — Inpatient Hospital Stay (HOSPITAL_COMMUNITY)
Admission: RE | Admit: 2019-04-14 | Discharge: 2019-04-15 | DRG: 470 | Disposition: A | Discharge status: Home / Self Care | Payer: PPO | Attending: Orthopedic Surgery | Admitting: Orthopedic Surgery

## 2019-04-14 ENCOUNTER — Inpatient Hospital Stay (HOSPITAL_COMMUNITY): Payer: PPO | Admitting: Certified Registered Nurse Anesthetist

## 2019-04-14 ENCOUNTER — Other Ambulatory Visit: Payer: Self-pay

## 2019-04-14 ENCOUNTER — Encounter (HOSPITAL_COMMUNITY)
Admission: RE | Disposition: A | Discharge status: Home / Self Care | Payer: Self-pay | Source: Home / Self Care | Attending: Orthopedic Surgery

## 2019-04-14 DIAGNOSIS — I452 Bifascicular block: Secondary | ICD-10-CM | POA: Diagnosis not present

## 2019-04-14 DIAGNOSIS — I77819 Aortic ectasia, unspecified site: Secondary | ICD-10-CM | POA: Diagnosis not present

## 2019-04-14 DIAGNOSIS — Z791 Long term (current) use of non-steroidal anti-inflammatories (NSAID): Secondary | ICD-10-CM | POA: Diagnosis not present

## 2019-04-14 DIAGNOSIS — K219 Gastro-esophageal reflux disease without esophagitis: Secondary | ICD-10-CM | POA: Diagnosis present

## 2019-04-14 DIAGNOSIS — M1612 Unilateral primary osteoarthritis, left hip: Secondary | ICD-10-CM | POA: Diagnosis not present

## 2019-04-14 DIAGNOSIS — Z9049 Acquired absence of other specified parts of digestive tract: Secondary | ICD-10-CM | POA: Diagnosis not present

## 2019-04-14 DIAGNOSIS — H409 Unspecified glaucoma: Secondary | ICD-10-CM | POA: Diagnosis present

## 2019-04-14 DIAGNOSIS — Z79899 Other long term (current) drug therapy: Secondary | ICD-10-CM | POA: Diagnosis not present

## 2019-04-14 DIAGNOSIS — E785 Hyperlipidemia, unspecified: Secondary | ICD-10-CM | POA: Diagnosis not present

## 2019-04-14 DIAGNOSIS — F411 Generalized anxiety disorder: Secondary | ICD-10-CM | POA: Diagnosis present

## 2019-04-14 DIAGNOSIS — M419 Scoliosis, unspecified: Secondary | ICD-10-CM | POA: Diagnosis present

## 2019-04-14 DIAGNOSIS — Z8249 Family history of ischemic heart disease and other diseases of the circulatory system: Secondary | ICD-10-CM

## 2019-04-14 DIAGNOSIS — Z96642 Presence of left artificial hip joint: Secondary | ICD-10-CM | POA: Diagnosis not present

## 2019-04-14 DIAGNOSIS — Z882 Allergy status to sulfonamides status: Secondary | ICD-10-CM | POA: Diagnosis not present

## 2019-04-14 DIAGNOSIS — Z79890 Hormone replacement therapy (postmenopausal): Secondary | ICD-10-CM | POA: Diagnosis not present

## 2019-04-14 DIAGNOSIS — Z96649 Presence of unspecified artificial hip joint: Secondary | ICD-10-CM

## 2019-04-14 DIAGNOSIS — Z85820 Personal history of malignant melanoma of skin: Secondary | ICD-10-CM

## 2019-04-14 DIAGNOSIS — E039 Hypothyroidism, unspecified: Secondary | ICD-10-CM | POA: Diagnosis present

## 2019-04-14 DIAGNOSIS — M169 Osteoarthritis of hip, unspecified: Secondary | ICD-10-CM | POA: Diagnosis present

## 2019-04-14 DIAGNOSIS — M25752 Osteophyte, left hip: Secondary | ICD-10-CM | POA: Diagnosis present

## 2019-04-14 DIAGNOSIS — F329 Major depressive disorder, single episode, unspecified: Secondary | ICD-10-CM | POA: Diagnosis not present

## 2019-04-14 DIAGNOSIS — Z471 Aftercare following joint replacement surgery: Secondary | ICD-10-CM | POA: Diagnosis not present

## 2019-04-14 DIAGNOSIS — I119 Hypertensive heart disease without heart failure: Secondary | ICD-10-CM | POA: Diagnosis not present

## 2019-04-14 DIAGNOSIS — Z419 Encounter for procedure for purposes other than remedying health state, unspecified: Secondary | ICD-10-CM

## 2019-04-14 HISTORY — PX: TOTAL HIP ARTHROPLASTY: SHX124

## 2019-04-14 LAB — TYPE AND SCREEN
ABO/RH(D): O NEG
Antibody Screen: NEGATIVE

## 2019-04-14 SURGERY — ARTHROPLASTY, HIP, TOTAL, ANTERIOR APPROACH
Anesthesia: Spinal | Site: Hip | Laterality: Left

## 2019-04-14 MED ORDER — SODIUM CHLORIDE 0.9 % IV SOLN
INTRAVENOUS | Status: DC
  Administered 2019-04-14: 18:00:00 via INTRAVENOUS

## 2019-04-14 MED ORDER — LATANOPROST 0.005 % OP SOLN
1.0000 [drp] | Freq: Every day | OPHTHALMIC | Status: DC
  Administered 2019-04-14: 23:00:00 1 [drp] via OPHTHALMIC
  Filled 2019-04-14: qty 2.5

## 2019-04-14 MED ORDER — METOCLOPRAMIDE HCL 5 MG PO TABS: 5.0000 mg | ORAL_TABLET | Freq: Three times a day (TID) | ORAL | Status: DC | PRN

## 2019-04-14 MED ORDER — MORPHINE SULFATE (PF) 4 MG/ML IV SOLN: 0.5000 mg | INTRAVENOUS | Status: DC | PRN

## 2019-04-14 MED ORDER — PROPOFOL 10 MG/ML IV BOLUS
INTRAVENOUS | Status: AC
  Filled 2019-04-14: qty 20

## 2019-04-14 MED ORDER — ONDANSETRON HCL 4 MG/2ML IJ SOLN
INTRAMUSCULAR | Status: AC
  Filled 2019-04-14: qty 2

## 2019-04-14 MED ORDER — ASPIRIN EC 325 MG PO TBEC
325.0000 mg | DELAYED_RELEASE_TABLET | Freq: Two times a day (BID) | ORAL | Status: DC
  Administered 2019-04-15: 08:00:00 325 mg via ORAL
  Filled 2019-04-14: qty 1

## 2019-04-14 MED ORDER — BUPIVACAINE IN DEXTROSE 0.75-8.25 % IT SOLN
INTRATHECAL | Status: DC | PRN
  Administered 2019-04-14: 1.8 mL via INTRATHECAL

## 2019-04-14 MED ORDER — ACETAMINOPHEN 500 MG PO TABS
500.0000 mg | ORAL_TABLET | Freq: Four times a day (QID) | ORAL | Status: DC
  Filled 2019-04-14: qty 1

## 2019-04-14 MED ORDER — DIPHENHYDRAMINE HCL 12.5 MG/5ML PO ELIX: 12.5000 mg | ORAL_SOLUTION | ORAL | Status: DC | PRN

## 2019-04-14 MED ORDER — PHENOL 1.4 % MT LIQD: 1.0000 | OROMUCOSAL | Status: DC | PRN

## 2019-04-14 MED ORDER — PROPOFOL 10 MG/ML IV BOLUS
INTRAVENOUS | Status: DC | PRN
  Administered 2019-04-14: 20 mg via INTRAVENOUS
  Administered 2019-04-14: 30 mg via INTRAVENOUS
  Administered 2019-04-14 (×2): 20 mg via INTRAVENOUS

## 2019-04-14 MED ORDER — DEXAMETHASONE SODIUM PHOSPHATE 10 MG/ML IJ SOLN
INTRAMUSCULAR | Status: AC
  Filled 2019-04-14: qty 1

## 2019-04-14 MED ORDER — BUPIVACAINE-EPINEPHRINE 0.25% -1:200000 IJ SOLN
INTRAMUSCULAR | Status: AC
  Filled 2019-04-14: qty 1

## 2019-04-14 MED ORDER — ACETAMINOPHEN 10 MG/ML IV SOLN
1000.0000 mg | Freq: Four times a day (QID) | INTRAVENOUS | Status: DC
  Administered 2019-04-14: 16:00:00 1000 mg via INTRAVENOUS
  Filled 2019-04-14: qty 100

## 2019-04-14 MED ORDER — FENTANYL CITRATE (PF) 100 MCG/2ML IJ SOLN: 25.0000 ug | INTRAMUSCULAR | Status: DC | PRN

## 2019-04-14 MED ORDER — DORZOLAMIDE HCL 2 % OP SOLN
1.0000 [drp] | Freq: Two times a day (BID) | OPHTHALMIC | Status: DC
  Administered 2019-04-15: 1 [drp] via OPHTHALMIC
  Filled 2019-04-14: qty 10

## 2019-04-14 MED ORDER — 0.9 % SODIUM CHLORIDE (POUR BTL) OPTIME
TOPICAL | Status: DC | PRN
  Administered 2019-04-14: 1000 mL

## 2019-04-14 MED ORDER — TRANEXAMIC ACID-NACL 1000-0.7 MG/100ML-% IV SOLN
1000.0000 mg | INTRAVENOUS | Status: AC
  Administered 2019-04-14: 14:00:00 1000 mg via INTRAVENOUS
  Filled 2019-04-14: qty 100

## 2019-04-14 MED ORDER — METOCLOPRAMIDE HCL 5 MG/ML IJ SOLN: 5.0000 mg | Freq: Three times a day (TID) | INTRAMUSCULAR | Status: DC | PRN

## 2019-04-14 MED ORDER — MENTHOL 3 MG MT LOZG: 1.0000 | LOZENGE | OROMUCOSAL | Status: DC | PRN

## 2019-04-14 MED ORDER — ONDANSETRON HCL 4 MG/2ML IJ SOLN: 4.0000 mg | Freq: Four times a day (QID) | INTRAMUSCULAR | Status: DC | PRN

## 2019-04-14 MED ORDER — FENTANYL CITRATE (PF) 100 MCG/2ML IJ SOLN
INTRAMUSCULAR | Status: AC
  Filled 2019-04-14: qty 2

## 2019-04-14 MED ORDER — CHLORHEXIDINE GLUCONATE 4 % EX LIQD: 60.0000 mL | Freq: Once | CUTANEOUS | Status: DC

## 2019-04-14 MED ORDER — METHOCARBAMOL 500 MG PO TABS
500.0000 mg | ORAL_TABLET | Freq: Four times a day (QID) | ORAL | Status: DC | PRN
  Administered 2019-04-14 – 2019-04-15 (×2): 500 mg via ORAL
  Filled 2019-04-14 (×2): qty 1

## 2019-04-14 MED ORDER — PROPOFOL 500 MG/50ML IV EMUL
INTRAVENOUS | Status: AC
  Filled 2019-04-14: qty 50

## 2019-04-14 MED ORDER — DEXAMETHASONE SODIUM PHOSPHATE 10 MG/ML IJ SOLN
8.0000 mg | Freq: Once | INTRAMUSCULAR | Status: AC
  Administered 2019-04-14: 15:00:00 8 mg via INTRAVENOUS

## 2019-04-14 MED ORDER — BUPIVACAINE-EPINEPHRINE (PF) 0.25% -1:200000 IJ SOLN
INTRAMUSCULAR | Status: DC | PRN
  Administered 2019-04-14: 30 mL

## 2019-04-14 MED ORDER — DEXAMETHASONE SODIUM PHOSPHATE 10 MG/ML IJ SOLN
10.0000 mg | Freq: Once | INTRAMUSCULAR | Status: AC
  Administered 2019-04-15: 10 mg via INTRAVENOUS
  Filled 2019-04-14: qty 1

## 2019-04-14 MED ORDER — FLEET ENEMA 7-19 GM/118ML RE ENEM: 1.0000 | ENEMA | Freq: Once | RECTAL | Status: DC | PRN

## 2019-04-14 MED ORDER — PROPOFOL 500 MG/50ML IV EMUL
INTRAVENOUS | Status: DC | PRN
  Administered 2019-04-14: 25 ug/kg/min via INTRAVENOUS
  Administered 2019-04-14: 50 ug/kg/min via INTRAVENOUS

## 2019-04-14 MED ORDER — LACTATED RINGERS IV SOLN
INTRAVENOUS | Status: DC
  Administered 2019-04-14: 13:00:00 via INTRAVENOUS

## 2019-04-14 MED ORDER — DOCUSATE SODIUM 100 MG PO CAPS
100.0000 mg | ORAL_CAPSULE | Freq: Two times a day (BID) | ORAL | Status: DC
  Administered 2019-04-14 – 2019-04-15 (×2): 100 mg via ORAL
  Filled 2019-04-14 (×2): qty 1

## 2019-04-14 MED ORDER — VENLAFAXINE HCL 50 MG PO TABS
50.0000 mg | ORAL_TABLET | Freq: Every day | ORAL | Status: DC
  Administered 2019-04-15: 09:00:00 50 mg via ORAL
  Filled 2019-04-14: qty 1

## 2019-04-14 MED ORDER — CEFAZOLIN SODIUM-DEXTROSE 2-4 GM/100ML-% IV SOLN
2.0000 g | INTRAVENOUS | Status: AC
  Administered 2019-04-14: 14:00:00 2 g via INTRAVENOUS
  Filled 2019-04-14: qty 100

## 2019-04-14 MED ORDER — HYDROCODONE-ACETAMINOPHEN 5-325 MG PO TABS
1.0000 | ORAL_TABLET | ORAL | Status: DC | PRN
  Administered 2019-04-14 (×2): 1 via ORAL
  Administered 2019-04-15 (×2): 2 via ORAL
  Filled 2019-04-14: qty 1
  Filled 2019-04-14 (×2): qty 2
  Filled 2019-04-14: qty 1

## 2019-04-14 MED ORDER — BISACODYL 10 MG RE SUPP: 10.0000 mg | Freq: Every day | RECTAL | Status: DC | PRN

## 2019-04-14 MED ORDER — POLYETHYLENE GLYCOL 3350 17 G PO PACK: 17.0000 g | PACK | Freq: Every day | ORAL | Status: DC | PRN

## 2019-04-14 MED ORDER — EPHEDRINE SULFATE-NACL 50-0.9 MG/10ML-% IV SOSY
PREFILLED_SYRINGE | INTRAVENOUS | Status: DC | PRN
  Administered 2019-04-14 (×2): 10 mg via INTRAVENOUS

## 2019-04-14 MED ORDER — LEVOTHYROXINE SODIUM 100 MCG PO TABS
100.0000 ug | ORAL_TABLET | Freq: Every day | ORAL | Status: DC
  Administered 2019-04-15: 05:00:00 100 ug via ORAL
  Filled 2019-04-14: qty 1

## 2019-04-14 MED ORDER — CEFAZOLIN SODIUM-DEXTROSE 2-4 GM/100ML-% IV SOLN
2.0000 g | Freq: Four times a day (QID) | INTRAVENOUS | Status: AC
  Administered 2019-04-14 – 2019-04-15 (×2): 2 g via INTRAVENOUS
  Filled 2019-04-14 (×2): qty 100

## 2019-04-14 MED ORDER — METHOCARBAMOL 500 MG IVPB - SIMPLE MED
500.0000 mg | Freq: Four times a day (QID) | INTRAVENOUS | Status: DC | PRN
  Filled 2019-04-14: qty 50

## 2019-04-14 MED ORDER — ONDANSETRON HCL 4 MG/2ML IJ SOLN
INTRAMUSCULAR | Status: DC | PRN
  Administered 2019-04-14: 4 mg via INTRAVENOUS

## 2019-04-14 MED ORDER — ONDANSETRON HCL 4 MG PO TABS: 4.0000 mg | ORAL_TABLET | Freq: Four times a day (QID) | ORAL | Status: DC | PRN

## 2019-04-14 MED ORDER — NEBIVOLOL HCL 5 MG PO TABS
5.0000 mg | ORAL_TABLET | Freq: Every day | ORAL | Status: DC
  Administered 2019-04-15: 09:00:00 5 mg via ORAL
  Filled 2019-04-14: qty 1

## 2019-04-14 MED ORDER — FAMOTIDINE 20 MG PO TABS
20.0000 mg | ORAL_TABLET | Freq: Every day | ORAL | Status: DC
  Administered 2019-04-15: 20 mg via ORAL
  Filled 2019-04-14: qty 1

## 2019-04-14 MED ORDER — POVIDONE-IODINE 10 % EX SWAB
2.0000 "application " | Freq: Once | CUTANEOUS | Status: AC
  Administered 2019-04-14: 2 via TOPICAL

## 2019-04-14 SURGICAL SUPPLY — 46 items
BAG DECANTER FOR FLEXI CONT (MISCELLANEOUS) IMPLANT
BAG SPEC THK2 15X12 ZIP CLS (MISCELLANEOUS)
BAG ZIPLOCK 12X15 (MISCELLANEOUS) IMPLANT
BLADE SAG 18X100X1.27 (BLADE) ×2 IMPLANT
COVER PERINEAL POST (MISCELLANEOUS) ×2 IMPLANT
COVER SURGICAL LIGHT HANDLE (MISCELLANEOUS) ×2 IMPLANT
COVER WAND RF STERILE (DRAPES) IMPLANT
CUP ACETBLR 54 OD PINNACLE (Hips) ×1 IMPLANT
DECANTER SPIKE VIAL GLASS SM (MISCELLANEOUS) ×2 IMPLANT
DRAPE STERI IOBAN 125X83 (DRAPES) ×2 IMPLANT
DRAPE U-SHAPE 47X51 STRL (DRAPES) ×4 IMPLANT
DRSG ADAPTIC 3X8 NADH LF (GAUZE/BANDAGES/DRESSINGS) ×2 IMPLANT
DRSG MEPILEX BORDER 4X4 (GAUZE/BANDAGES/DRESSINGS) ×2 IMPLANT
DRSG MEPILEX BORDER 4X8 (GAUZE/BANDAGES/DRESSINGS) ×2 IMPLANT
DURAPREP 26ML APPLICATOR (WOUND CARE) ×2 IMPLANT
ELECT REM PT RETURN 15FT ADLT (MISCELLANEOUS) ×2 IMPLANT
EVACUATOR 1/8 PVC DRAIN (DRAIN) ×2 IMPLANT
GLOVE BIO SURGEON STRL SZ 6 (GLOVE) IMPLANT
GLOVE BIO SURGEON STRL SZ7 (GLOVE) IMPLANT
GLOVE BIO SURGEON STRL SZ8 (GLOVE) ×2 IMPLANT
GLOVE BIOGEL PI IND STRL 6.5 (GLOVE) IMPLANT
GLOVE BIOGEL PI IND STRL 7.0 (GLOVE) IMPLANT
GLOVE BIOGEL PI IND STRL 8 (GLOVE) ×1 IMPLANT
GLOVE BIOGEL PI INDICATOR 6.5 (GLOVE)
GLOVE BIOGEL PI INDICATOR 7.0 (GLOVE)
GLOVE BIOGEL PI INDICATOR 8 (GLOVE) ×1
GOWN STRL REUS W/TWL LRG LVL3 (GOWN DISPOSABLE) ×2 IMPLANT
GOWN STRL REUS W/TWL XL LVL3 (GOWN DISPOSABLE) IMPLANT
HEAD M SROM 36MM PLUS 1.5 (Hips) IMPLANT
HOLDER FOLEY CATH W/STRAP (MISCELLANEOUS) ×2 IMPLANT
KIT TURNOVER KIT A (KITS) IMPLANT
LINER MARATHON NEUT +4X54X36 (Hips) ×1 IMPLANT
MANIFOLD NEPTUNE II (INSTRUMENTS) ×2 IMPLANT
PACK ANTERIOR HIP CUSTOM (KITS) ×2 IMPLANT
SROM M HEAD 36MM PLUS 1.5 (Hips) ×2 IMPLANT
STEM FEMORAL SZ 6MM STD ACTIS (Stem) ×1 IMPLANT
STRIP CLOSURE SKIN 1/2X4 (GAUZE/BANDAGES/DRESSINGS) ×2 IMPLANT
SUT ETHIBOND NAB CT1 #1 30IN (SUTURE) ×2 IMPLANT
SUT MNCRL AB 4-0 PS2 18 (SUTURE) ×2 IMPLANT
SUT STRATAFIX 0 PDS 27 VIOLET (SUTURE) ×2
SUT VIC AB 2-0 CT1 27 (SUTURE) ×4
SUT VIC AB 2-0 CT1 TAPERPNT 27 (SUTURE) ×2 IMPLANT
SUTURE STRATFX 0 PDS 27 VIOLET (SUTURE) ×1 IMPLANT
SYR 50ML LL SCALE MARK (SYRINGE) IMPLANT
TRAY FOLEY MTR SLVR 16FR STAT (SET/KITS/TRAYS/PACK) ×2 IMPLANT
YANKAUER SUCT BULB TIP 10FT TU (MISCELLANEOUS) ×2 IMPLANT

## 2019-04-14 NOTE — Anesthesia Procedure Notes (Addendum)
Procedure Name: MAC Date/Time: 04/14/2019 1:55 PM Performed by: West Pugh, CRNA Pre-anesthesia Checklist: Patient identified, Emergency Drugs available, Suction available, Patient being monitored and Timeout performed Patient Re-evaluated:Patient Re-evaluated prior to induction Oxygen Delivery Method: Simple face mask Preoxygenation: Pre-oxygenation with 100% oxygen Placement Confirmation: positive ETCO2 Dental Injury: Teeth and Oropharynx as per pre-operative assessment

## 2019-04-14 NOTE — Op Note (Signed)
OPERATIVE REPORT- TOTAL HIP ARTHROPLASTY   PREOPERATIVE DIAGNOSIS: Osteoarthritis of the Left hip.   POSTOPERATIVE DIAGNOSIS: Osteoarthritis of the Left  hip.   PROCEDURE: Left total hip arthroplasty, anterior approach.   SURGEON: Gaynelle Arabian, MD   ASSISTANT: Griffith Citron, PA-C  ANESTHESIA:  Spinal  ESTIMATED BLOOD LOSS:- 100 ml  DRAINS: Hemovac x1.   COMPLICATIONS: None   CONDITION: PACU - hemodynamically stable.   BRIEF CLINICAL NOTE: Jake Collins is a 73 y.o. male who has advanced end-  stage arthritis of their Left  hip with progressively worsening pain and  dysfunction.The patient has failed nonoperative management and presents for  total hip arthroplasty.   PROCEDURE IN DETAIL: After successful administration of spinal  anesthetic, the traction boots for the [facility] bed were placed on both  feet and the patient was placed onto the [facility] bed, boots placed into the leg  holders. The Left hip was then isolated from the perineum with plastic  drapes and prepped and draped in the usual sterile fashion. ASIS and  greater trochanter were marked and a oblique incision was made, starting  at about 1 cm lateral and 2 cm distal to the ASIS and coursing towards  the anterior cortex of the femur. The skin was cut with a 10 blade  through subcutaneous tissue to the level of the fascia overlying the  tensor fascia lata muscle. The fascia was then incised in line with the  incision at the junction of the anterior third and posterior 2/3rd. The  muscle was teased off the fascia and then the interval between the TFL  and the rectus was developed. The Hohmann retractor was then placed at  the top of the femoral neck over the capsule. The vessels overlying the  capsule were cauterized and the fat on top of the capsule was removed.  A Hohmann retractor was then placed anterior underneath the rectus  femoris to give exposure to the entire anterior capsule. A T-shaped   capsulotomy was performed. The edges were tagged and the femoral head  was identified.       Osteophytes are removed off the superior acetabulum.  The femoral neck was then cut in situ with an oscillating saw. Traction  was then applied to the left lower extremity utilizing the [facility]  traction. The femoral head was then removed. Retractors were placed  around the acetabulum and then circumferential removal of the labrum was  performed. Osteophytes were also removed. Reaming starts at 49 mm to  medialize and  Increased in 2 mm increments to 53 mm. We reamed in  approximately 40 degrees of abduction, 20 degrees anteversion. A 54 mm  pinnacle acetabular shell was then impacted in anatomic position under  fluoroscopic guidance with excellent purchase. We did not need to place  any additional dome screws. A 36 mm neutral + 4 marathon liner was then  placed into the acetabular shell.       The femoral lift was then placed along the lateral aspect of the femur  just distal to the vastus ridge. The leg was  externally rotated and capsule  was stripped off the inferior aspect of the femoral neck down to the  level of the lesser trochanter, this was done with electrocautery. The femur was lifted after this was performed. The  leg was then placed in an extended and adducted position essentially delivering the femur. We also removed the capsule superiorly and the piriformis from the piriformis fossa  to gain excellent exposure of the  proximal femur. Rongeur was used to remove some cancellous bone to get  into the lateral portion of the proximal femur for placement of the  initial starter reamer. The starter broaches was placed  the starter broach  and was shown to go down the center of the canal. Broaching  with the Actis system was then performed starting at size 0  coursing  Up to size 6. A size 6 had excellent torsional and rotational  and axial stability. The trial standard offset neck was then  placed  with a 36 + 1.5 trial head. The hip was then reduced. We confirmed that  the stem was in the canal both on AP and lateral x-rays. It also has excellent sizing. The hip was reduced with outstanding stability through full extension and full external rotation.. AP pelvis was taken and the leg lengths were measured and found to be equal. Hip was then dislocated again and the femoral head and neck removed. The  femoral broach was removed. Size 6 Actis stem with a standard offset  neck was then impacted into the femur following native anteversion. Has  excellent purchase in the canal. Excellent torsional and rotational and  axial stability. It is confirmed to be in the canal on AP and lateral  fluoroscopic views. The 36 + 1.5 metal head was placed and the hip  reduced with outstanding stability. Again AP pelvis was taken and it  confirmed that the leg lengths were equal. The wound was then copiously  irrigated with saline solution and the capsule reattached and repaired  with Ethibond suture. 30 ml of .25% Bupivicaine was  injected into the capsule and into the edge of the tensor fascia lata as well as subcutaneous tissue. The fascia overlying the tensor fascia lata was then closed with a running #1 V-Loc. Subcu was closed with interrupted 2-0 Vicryl and subcuticular running 4-0 Monocryl. Incision was cleaned  and dried. Steri-Strips and a bulky sterile dressing applied. Hemovac  drain was hooked to suction and then the patient was awakened and transported to  recovery in stable condition.        Please note that a surgical assistant was a medical necessity for this procedure to perform it in a safe and expeditious manner. Assistant was necessary to provide appropriate retraction of vital neurovascular structures and to prevent femoral fracture and allow for anatomic placement of the prosthesis.  Gaynelle Arabian, M.D.

## 2019-04-14 NOTE — Anesthesia Procedure Notes (Signed)
Spinal  Patient location during procedure: OR Start time: 04/14/2019 1:49 PM End time: 04/14/2019 1:59 PM Staffing Anesthesiologist: Freddrick March, MD Performed: anesthesiologist  Preanesthetic Checklist Completed: patient identified, surgical consent, pre-op evaluation, timeout performed, IV checked, risks and benefits discussed and monitors and equipment checked Spinal Block Patient position: sitting Prep: site prepped and draped and DuraPrep Patient monitoring: cardiac monitor, continuous pulse ox and blood pressure Approach: midline Location: L3-4 Injection technique: single-shot Needle Needle type: Pencan  Needle gauge: 24 G Needle length: 9 cm Assessment Sensory level: T6 Additional Notes Functioning IV was confirmed and monitors were applied. Sterile prep and drape, including hand hygiene and sterile gloves were used. The patient was positioned and the spine was prepped. The skin was anesthetized with lidocaine.  Free flow of clear CSF was obtained prior to injecting local anesthetic into the CSF.  The spinal needle aspirated freely following injection.  The needle was carefully withdrawn.  The patient tolerated the procedure well.

## 2019-04-14 NOTE — Interval H&P Note (Signed)
History and Physical Interval Note:  04/14/2019 12:53 PM  Jake Collins  has presented today for surgery, with the diagnosis of left hip osteoarthritis.  The various methods of treatment have been discussed with the patient and family. After consideration of risks, benefits and other options for treatment, the patient has consented to  Procedure(s) with comments: Rembert (Left) - 177min as a surgical intervention.  The patient's history has been reviewed, patient examined, no change in status, stable for surgery.  I have reviewed the patient's chart and labs.  Questions were answered to the patient's satisfaction.     Pilar Plate Tanairy Payeur

## 2019-04-14 NOTE — Anesthesia Preprocedure Evaluation (Addendum)
Anesthesia Evaluation  Patient identified by MRN, date of birth, ID band Patient awake    Reviewed: Allergy & Precautions, NPO status , Patient's Chart, lab work & pertinent test results, reviewed documented beta blocker date and time   Airway Mallampati: II  TM Distance: >3 FB Neck ROM: Full    Dental no notable dental hx. (+) Teeth Intact, Dental Advisory Given   Pulmonary neg pulmonary ROS,    Pulmonary exam normal breath sounds clear to auscultation       Cardiovascular hypertension, Pt. on medications and Pt. on home beta blockers Normal cardiovascular exam Rhythm:Regular Rate:Normal  HLD  Stress Test 2018 Normal exercise treadmill stress test. No ischemia. Excellent exercise tolerance. Normal BP response to stress.  TTE 201   Neuro/Psych PSYCHIATRIC DISORDERS Anxiety Depression negative neurological ROS     GI/Hepatic Neg liver ROS, GERD  Medicated,  Endo/Other  Hypothyroidism   Renal/GU negative Renal ROS  negative genitourinary   Musculoskeletal  (+) Arthritis ,   Abdominal   Peds  Hematology negative hematology ROS (+)   Anesthesia Other Findings   Reproductive/Obstetrics                            Anesthesia Physical Anesthesia Plan  ASA: II  Anesthesia Plan: Spinal   Post-op Pain Management:    Induction:   PONV Risk Score and Plan: Treatment may vary due to age or medical condition  Airway Management Planned: Natural Airway  Additional Equipment:   Intra-op Plan:   Post-operative Plan:   Informed Consent: I have reviewed the patients History and Physical, chart, labs and discussed the procedure including the risks, benefits and alternatives for the proposed anesthesia with the patient or authorized representative who has indicated his/her understanding and acceptance.     Dental advisory given  Plan Discussed with: CRNA  Anesthesia Plan Comments:          Anesthesia Quick Evaluation

## 2019-04-14 NOTE — Transfer of Care (Signed)
Immediate Anesthesia Transfer of Care Note  Patient: Jake Collins  Procedure(s) Performed: TOTAL HIP ARTHROPLASTY ANTERIOR APPROACH (Left Hip)  Patient Location: PACU  Anesthesia Type:Spinal  Level of Consciousness: sedated  Airway & Oxygen Therapy: Patient Spontanous Breathing and Patient connected to face mask oxygen  Post-op Assessment: Report given to RN and Post -op Vital signs reviewed and stable  Post vital signs: Reviewed and stable  Last Vitals:  Vitals Value Taken Time  BP    Temp    Pulse    Resp    SpO2      Last Pain:  Vitals:   04/14/19 1245  TempSrc: Oral  PainSc: 2       Patients Stated Pain Goal: 4 ([id number])  Complications: No apparent anesthesia complications

## 2019-04-14 NOTE — Anesthesia Postprocedure Evaluation (Signed)
Anesthesia Post Note  Patient: Jake Collins  Procedure(s) Performed: TOTAL HIP ARTHROPLASTY ANTERIOR APPROACH (Left Hip)     Patient location during evaluation: PACU Anesthesia Type: Spinal Level of consciousness: oriented and awake and alert Pain management: pain level controlled Vital Signs Assessment: post-procedure vital signs reviewed and stable Respiratory status: spontaneous breathing, respiratory function stable and patient connected to nasal cannula oxygen Cardiovascular status: blood pressure returned to baseline and stable Postop Assessment: no headache, no backache and no apparent nausea or vomiting Anesthetic complications: no    Last Vitals:  Vitals:   04/14/19 1630 04/14/19 1700  BP:  (!) 154/67  Pulse: (!) 59 60  Resp: 13 16  Temp:  36.6 C  SpO2: 100% 100%    Last Pain:  Vitals:   04/14/19 1836  TempSrc:   PainSc: 3    Pain Goal: Patients Stated Pain Goal: 3 (04/14/19 1836)                 Haywood Lasso L [place] Bitner

## 2019-04-15 ENCOUNTER — Encounter (HOSPITAL_COMMUNITY): Payer: Self-pay | Admitting: Orthopedic Surgery

## 2019-04-15 LAB — CBC
HCT: 44.2 % (ref 39.0–52.0)
Hemoglobin: 14.6 g/dL (ref 13.0–17.0)
MCH: 30.9 pg (ref 26.0–34.0)
MCHC: 33 g/dL (ref 30.0–36.0)
MCV: 93.4 fL (ref 80.0–100.0)
Platelets: 202 10*3/uL (ref 150–400)
RBC: 4.73 MIL/uL (ref 4.22–5.81)
RDW: 12.4 % (ref 11.5–15.5)
WBC: 13.3 10*3/uL — ABNORMAL HIGH (ref 4.0–10.5)
nRBC: 0 % (ref 0.0–0.2)

## 2019-04-15 LAB — BASIC METABOLIC PANEL
Anion gap: 11 (ref 5–15)
BUN: 17 mg/dL (ref 8–23)
CO2: 22 mmol/L (ref 22–32)
Calcium: 8.6 mg/dL — ABNORMAL LOW (ref 8.9–10.3)
Chloride: 104 mmol/L (ref 98–111)
Creatinine, Ser: 1.15 mg/dL (ref 0.61–1.24)
GFR calc Af Amer: >60 mL/min (ref >60)
GFR calc non Af Amer: >60 mL/min (ref >60)
Glucose, Bld: 158 mg/dL — ABNORMAL HIGH (ref 70–99)
Potassium: 4.1 mmol/L (ref 3.5–5.1)
Sodium: 137 mmol/L (ref 135–145)

## 2019-04-15 MED ORDER — HYDROCODONE-ACETAMINOPHEN 5-325 MG PO TABS: 1.0000 | ORAL_TABLET | Freq: Four times a day (QID) | ORAL | 0 refills | Status: DC | PRN

## 2019-04-15 MED ORDER — METHOCARBAMOL 500 MG PO TABS: 500.0000 mg | ORAL_TABLET | Freq: Four times a day (QID) | ORAL | 0 refills | Status: DC | PRN

## 2019-04-15 MED ORDER — ASPIRIN 325 MG PO TBEC: 325.0000 mg | DELAYED_RELEASE_TABLET | Freq: Two times a day (BID) | ORAL | 0 refills | Status: DC

## 2019-04-15 NOTE — Discharge Instructions (Signed)
°Dr. Frank Aluisio °Total Joint Specialist °[facility] °[address] °[place], [place] [address] °[phone] ° °ANTERIOR APPROACH TOTAL HIP REPLACEMENT POSTOPERATIVE DIRECTIONS ° ° °Hip Rehabilitation, Guidelines Following Surgery  °The results of a hip operation are greatly improved after range of motion and muscle strengthening exercises. Follow all safety measures which are given to protect your hip. If any of these exercises cause increased pain or swelling in your joint, decrease the amount until you are comfortable again. Then slowly increase the exercises. Call your caregiver if you have problems or questions.  ° °HOME CARE INSTRUCTIONS  °• Remove items at home which could result in a fall. This includes throw rugs or furniture in walking pathways.  °· ICE to the affected hip every three hours for 30 minutes at a time and then as needed for pain and swelling.  Continue to use ice on the hip for pain and swelling from surgery. You may notice swelling that will progress down to the foot and ankle.  This is normal after surgery.  Elevate the leg when you are not up walking on it.   °· Continue to use the breathing machine which will help keep your temperature down.  It is common for your temperature to cycle up and down following surgery, especially at night when you are not up moving around and exerting yourself.  The breathing machine keeps your lungs expanded and your temperature down. ° °DIET °You may resume your previous home diet once your are discharged from the hospital. ° °DRESSING / WOUND CARE / SHOWERING °You may shower 3 days after surgery, but keep the wounds dry during showering.  You may use an occlusive plastic wrap (Press'n Seal for example), NO SOAKING/SUBMERGING IN THE BATHTUB.  If the bandage gets wet, change with a clean dry gauze.  If the incision gets wet, pat the wound dry with a clean towel. °You may start showering once you are discharged home but do not submerge the  incision under water. Just pat the incision dry and apply a dry gauze dressing on daily. °Change the surgical dressing daily and reapply a dry dressing each time. ° °ACTIVITY °Walk with your walker as instructed. °Use walker as long as suggested by your caregivers. °Avoid periods of inactivity such as sitting longer than an hour when not asleep. This helps prevent blood clots.  °You may resume a sexual relationship in one month or when given the OK by your doctor.  °You may return to work once you are cleared by your doctor.  °Do not drive a car for 6 weeks or until released by you surgeon.  °Do not drive while taking narcotics. ° °WEIGHT BEARING °Weight bearing as tolerated with assist device (walker, cane, etc) as directed, use it as long as suggested by your surgeon or therapist, typically at least 4-6 weeks. ° °POSTOPERATIVE CONSTIPATION PROTOCOL °Constipation - defined medically as fewer than three stools per week and severe constipation as less than one stool per week. ° °One of the most common issues patients have following surgery is constipation.  Even if you have a regular bowel pattern at home, your normal regimen is likely to be disrupted due to multiple reasons following surgery.  Combination of anesthesia, postoperative narcotics, change in appetite and fluid intake all can affect your bowels.  In order to avoid complications following surgery, here are some recommendations in order to help you during your recovery period. ° °Colace (docusate) - Pick up an over-the-counter form   of Colace or another stool softener and take twice a day as long as you are requiring postoperative pain medications.  Take with a full glass of water daily.  If you experience loose stools or diarrhea, hold the colace until you stool forms back up.  If your symptoms do not get better within 1 week or if they get worse, check with your doctor. ° °Dulcolax (bisacodyl) - Pick up over-the-counter and take as directed by the product  packaging as needed to assist with the movement of your bowels.  Take with a full glass of water.  Use this product as needed if not relieved by Colace only.  ° °MiraLax (polyethylene glycol) - Pick up over-the-counter to have on hand.  MiraLax is a solution that will increase the amount of water in your bowels to assist with bowel movements.  Take as directed and can mix with a glass of water, juice, soda, coffee, or tea.  Take if you go more than two days without a movement. °Do not use MiraLax more than once per day. Call your doctor if you are still constipated or irregular after using this medication for 7 days in a row. ° °If you continue to have problems with postoperative constipation, please contact the office for further assistance and recommendations.  If you experience "the worst abdominal pain ever" or develop nausea or vomiting, please contact the office immediatly for further recommendations for treatment. ° °ITCHING ° If you experience itching with your medications, try taking only a single pain pill, or even half a pain pill at a time.  You can also use Benadryl over the counter for itching or also to help with sleep.  ° °TED HOSE STOCKINGS °Wear the elastic stockings on both legs for three weeks following surgery during the day but you may remove then at night for sleeping. ° °MEDICATIONS °See your medication summary on the “After Visit Summary” that the nursing staff will review with you prior to discharge.  You may have some home medications which will be placed on hold until you complete the course of blood thinner medication.  It is important for you to complete the blood thinner medication as prescribed by your surgeon.  Continue your approved medications as instructed at time of discharge. ° °PRECAUTIONS °If you experience chest pain or shortness of breath - call 911 immediately for transfer to the hospital emergency department.  °If you develop a fever greater that 101 F, purulent drainage  from wound, increased redness or drainage from wound, foul odor from the wound/dressing, or calf pain - CONTACT YOUR SURGEON.   °                                                °FOLLOW-UP APPOINTMENTS °Make sure you keep all of your appointments after your operation with your surgeon and caregivers. You should call the office at the above phone number and make an appointment for approximately two weeks after the date of your surgery or on the date instructed by your surgeon outlined in the "After Visit Summary". ° °RANGE OF MOTION AND STRENGTHENING EXERCISES  °These exercises are designed to help you keep full movement of your hip joint. Follow your caregiver's or physical therapist's instructions. Perform all exercises about fifteen times, three times per day or as directed. Exercise both hips, even if you have   had only one joint replacement. These exercises can be done on a training (exercise) mat, on the floor, on a table or on a bed. Use whatever works the best and is most comfortable for you. Use music or television while you are exercising so that the exercises are a pleasant break in your day. This will make your life better with the exercises acting as a break in routine you can look forward to.  °• Lying on your back, slowly slide your foot toward your buttocks, raising your knee up off the floor. Then slowly slide your foot back down until your leg is straight again.  °• Lying on your back spread your legs as far apart as you can without causing discomfort.  °• Lying on your side, raise your upper leg and foot straight up from the floor as far as is comfortable. Slowly lower the leg and repeat.  °• Lying on your back, tighten up the muscle in the front of your thigh (quadriceps muscles). You can do this by keeping your leg straight and trying to raise your heel off the floor. This helps strengthen the largest muscle supporting your knee.  °• Lying on your back, tighten up the muscles of your buttocks both  with the legs straight and with the knee bent at a comfortable angle while keeping your heel on the floor.  ° °IF YOU ARE TRANSFERRED TO A SKILLED REHAB FACILITY °If the patient is transferred to a skilled rehab facility following release from the hospital, a list of the current medications will be sent to the facility for the patient to continue.  When discharged from the skilled rehab facility, please have the facility set up the patient's Home Health Physical Therapy prior to being released. Also, the skilled facility will be responsible for providing the patient with their medications at time of release from the facility to include their pain medication, the muscle relaxants, and their blood thinner medication. If the patient is still at the rehab facility at time of the two week follow up appointment, the skilled rehab facility will also need to assist the patient in arranging follow up appointment in our office and any transportation needs. ° °MAKE SURE YOU:  °• Understand these instructions.  °• Get help right away if you are not doing well or get worse.  ° ° °Pick up stool softner and laxative for home use following surgery while on pain medications. °Do not submerge incision under water. °Please use good hand washing techniques while changing dressing each day. °May shower starting three days after surgery. °Please use a clean towel to pat the incision dry following showers. °Continue to use ice for pain and swelling after surgery. °Do not use any lotions or creams on the incision until instructed by your surgeon. ° °

## 2019-04-15 NOTE — Progress Notes (Signed)
Physical Therapy Treatment Patient Details Name: Jake Collins MRN: [id number] DOB: 11/13/1945 Today's Date: 04/15/2019    History of Present Illness Pt s/p L THR    PT Comments    Pt performed home therex with assist - written program provided and reviewed with progression.  Car transfers and lower body dressing reviewed.  Pt very eager for dc.   Follow Up Recommendations  Follow surgeon's recommendation for DC plan and follow-up therapies     Equipment Recommendations  None recommended by PT    Recommendations for Other Services       Precautions / Restrictions Precautions Precautions: Fall Restrictions Weight Bearing Restrictions: No LLE Weight Bearing: Weight bearing as tolerated    Mobility  Bed Mobility Overal bed mobility: Needs Assistance Bed Mobility: Supine to Sit     Supine to sit: Min guard     General bed mobility comments: cues for sequence, use of R LE to self assist and use of belt to assist L LE  Transfers Overall transfer level: Modified independent Equipment used: Rolling walker (2 wheeled)                Ambulation/Gait Ambulation/Gait assistance: Min guard;Supervision Gait Distance (Feet): 150 Feet Assistive device: Rolling walker (2 wheeled) Gait Pattern/deviations: Step-to pattern;Step-through pattern;Decreased step length - right;Decreased step length - left;Shuffle;Trunk flexed     General Gait Details: cues for sequence, posture, and position from RW   Stairs Stairs: Yes Stairs assistance: Min assist;Min guard Stair Management: No rails;Step to pattern;Forwards;With walker Number of Stairs: 2 General stair comments: single step twice with RW and cues for sequence and foot/RW placement   Wheelchair Mobility    Modified Rankin (Stroke Patients Only)       Balance Overall balance assessment: Mild deficits observed, not formally tested                                          Cognition  Arousal/Alertness: Awake/alert Behavior During Therapy: WFL for tasks assessed/performed;Impulsive Overall Cognitive Status: Within Functional Limits for tasks assessed                                        Exercises Total Joint Exercises Ankle Circles/Pumps: AROM;Both;20 reps;Supine Quad Sets: AROM;Both;10 reps;Supine Heel Slides: AAROM;Left;20 reps;Supine Hip ABduction/ADduction: AAROM;Left;20 reps;Supine Long Arc Quad: AAROM;AROM;Left;10 reps;Seated    General Comments        Pertinent Vitals/Pain Pain Assessment: 0-10 Pain Score: 4  Pain Location: L hip Pain Descriptors / Indicators: Aching;Sore Pain Intervention(s): Limited activity within patient's tolerance;Monitored during session;Premedicated before session;Ice applied    Home Living Family/patient expects to be discharged to:: Private residence Living Arrangements: Spouse/significant other Available Help at Discharge: Family Type of Home: House Home Access: Stairs to enter   Home Layout: One level Home Equipment: Environmental consultant - 2 wheels      Prior Function Level of Independence: Independent          PT Goals (current goals can now be found in the care plan section) Acute Rehab PT Goals Patient Stated Goal: HOME ASAP PT Goal Formulation: With patient Time For Goal Achievement: 04/29/19 Potential to Achieve Goals: Good Progress towards PT goals: Progressing toward goals    Frequency    7X/week      PT Plan Current plan  remains appropriate    Co-evaluation              AM-PAC PT "6 Clicks" Mobility   Outcome Measure  Help needed turning from your back to your side while in a flat bed without using bedrails?: A Little Help needed moving from lying on your back to sitting on the side of a flat bed without using bedrails?: A Little Help needed moving to and from a bed to a chair (including a wheelchair)?: A Little Help needed standing up from a chair using your arms (e.g.,  wheelchair or bedside chair)?: A Little Help needed to walk in hospital room?: A Little Help needed climbing 3-5 steps with a railing? : A Little 6 Click Score: 18    End of Session Equipment Utilized During Treatment: Gait belt Activity Tolerance: Patient tolerated treatment well Patient left: in chair;with call bell/phone within reach;with chair alarm set Nurse Communication: Mobility status PT Visit Diagnosis: Difficulty in walking, not elsewhere classified (R26.2)     Time: [id number] PT Time Calculation (min) (ACUTE ONLY): 21 min  Charges:  $Gait Training: 8-22 mins $Therapeutic Exercise: 8-22 mins                     [place] Pager [phone] Office [phone]    Kaelem Brach 04/15/2019, 11:57 AM

## 2019-04-15 NOTE — Evaluation (Signed)
Physical Therapy Evaluation Patient Details Name: Jake Collins MRN: [id number] DOB: 1945-10-22 Today's Date: 04/15/2019   History of Present Illness  Pt s/p L THR  Clinical Impression  Pt s/p L LTHR and presents with decreased L LE strength/ROM and post op pain limiting functional mobility.  Pt should progress to dc home with family assist.    Follow Up Recommendations Follow surgeon's recommendation for DC plan and follow-up therapies    Equipment Recommendations  None recommended by PT    Recommendations for Other Services       Precautions / Restrictions Precautions Precautions: Fall Restrictions Weight Bearing Restrictions: No LLE Weight Bearing: Weight bearing as tolerated      Mobility  Bed Mobility Overal bed mobility: Needs Assistance Bed Mobility: Supine to Sit     Supine to sit: Min guard     General bed mobility comments: cues for sequence, use of R LE to self assist and use of belt to assist L LE  Transfers Overall transfer level: Modified independent Equipment used: Rolling walker (2 wheeled)                Ambulation/Gait Ambulation/Gait assistance: Min guard;Supervision Gait Distance (Feet): 150 Feet Assistive device: Rolling walker (2 wheeled) Gait Pattern/deviations: Step-to pattern;Step-through pattern;Decreased step length - right;Decreased step length - left;Shuffle;Trunk flexed     General Gait Details: cues for sequence, posture, and position from RW  Stairs Stairs: Yes Stairs assistance: Min assist;Min guard Stair Management: No rails;Step to pattern;Forwards;With walker Number of Stairs: 2 General stair comments: single step twice with RW and cues for sequence and foot/RW placement  Wheelchair Mobility    Modified Rankin (Stroke Patients Only)       Balance Overall balance assessment: Mild deficits observed, not formally tested                                           Pertinent Vitals/Pain Pain  Assessment: 0-10 Pain Score: 4  Pain Location: L hip Pain Descriptors / Indicators: Aching;Sore Pain Intervention(s): Limited activity within patient's tolerance;Monitored during session;Premedicated before session;Ice applied    Home Living Family/patient expects to be discharged to:: Private residence Living Arrangements: Spouse/significant other Available Help at Discharge: Family Type of Home: House Home Access: Stairs to enter   Technical brewer of Steps: 1 Home Layout: One level Home Equipment: Environmental consultant - 2 wheels      Prior Function Level of Independence: Independent               Hand Dominance        Extremity/Trunk Assessment   Upper Extremity Assessment Upper Extremity Assessment: Overall WFL for tasks assessed    Lower Extremity Assessment Lower Extremity Assessment: LLE deficits/detail LLE Deficits / Details: AAROM at hip to 80 flex and 15 abd; Strength at hip 2+/5    Cervical / Trunk Assessment Cervical / Trunk Assessment: Normal  Communication   Communication: No difficulties  Cognition Arousal/Alertness: Awake/alert Behavior During Therapy: WFL for tasks assessed/performed;Impulsive Overall Cognitive Status: Within Functional Limits for tasks assessed                                        General Comments      Exercises     Assessment/Plan    PT Assessment Patient needs  continued PT services  PT Problem List Decreased strength;Decreased range of motion;Decreased activity tolerance;Decreased mobility;Decreased knowledge of use of DME;Pain       PT Treatment Interventions DME instruction;Gait training;Stair training;Functional mobility training;Therapeutic activities;Therapeutic exercise;Patient/family education    PT Goals (Current goals can be found in the Care Plan section)  Acute Rehab PT Goals Patient Stated Goal: HOME ASAP PT Goal Formulation: With patient Time For Goal Achievement: 04/29/19 Potential to  Achieve Goals: Good    Frequency 7X/week   Barriers to discharge        Co-evaluation               AM-PAC PT "6 Clicks" Mobility  Outcome Measure Help needed turning from your back to your side while in a flat bed without using bedrails?: A Little Help needed moving from lying on your back to sitting on the side of a flat bed without using bedrails?: A Little Help needed moving to and from a bed to a chair (including a wheelchair)?: A Little Help needed standing up from a chair using your arms (e.g., wheelchair or bedside chair)?: A Little Help needed to walk in hospital room?: A Little Help needed climbing 3-5 steps with a railing? : A Little 6 Click Score: 18    End of Session Equipment Utilized During Treatment: Gait belt Activity Tolerance: Patient tolerated treatment well Patient left: in chair;with call bell/phone within reach;with chair alarm set Nurse Communication: Mobility status PT Visit Diagnosis: Difficulty in walking, not elsewhere classified (R26.2)    Time: [id number] PT Time Calculation (min) (ACUTE ONLY): 30 min   Charges:   PT Evaluation $PT Eval Low Complexity: 1 Low PT Treatments $Gait Training: 8-22 mins        [place] Pager [phone] Office [phone]   Leyanna Bittman 04/15/2019, 11:51 AM

## 2019-04-15 NOTE — Progress Notes (Signed)
   Subjective: 1 Day Post-Op Procedure(s) (LRB): TOTAL HIP ARTHROPLASTY ANTERIOR APPROACH (Left) Patient reports pain as mild.   Patient seen in rounds with Dr. Wynelle Link. Patient is well, and has had no acute complaints or problems. No issues overnight. No SOB or chest pain. Voiding well. Positive flatus. Did not have time to get up with therapy yesterday.  Plan is to go Home after hospital stay.  Objective: Vital signs in last 24 hours: Temp:  [96.8 F (36 C)-98.1 F (36.7 C)] 98 F (36.7 C) (11/05 0553) Pulse Rate:  [58-80] 69 (11/05 0553) Resp:  [13-18] 18 (11/05 0553) BP: (89-156)/(49-86) 156/68 (11/05 0553) SpO2:  [95 %-100 %] 97 % (11/05 0553) Weight:  [81.9 kg] 81.9 kg (11/04 1918)  Intake/Output from previous day:  Intake/Output Summary (Last 24 hours) at 04/15/2019 0714 Last data filed at 04/15/2019 0600 Gross per 24 hour  Intake 3179.8 ml  Output 2910 ml  Net 269.8 ml     Labs: Recent Labs    04/15/19 0308  HGB 14.6   Recent Labs    04/15/19 0308  WBC 13.3*  RBC 4.73  HCT 44.2  PLT 202   Recent Labs    04/15/19 0308  NA 137  K 4.1  CL 104  CO2 22  BUN 17  CREATININE 1.15  GLUCOSE 158*  CALCIUM 8.6*    EXAM General - Patient is Alert and Oriented Extremity - Neurologically intact Intact pulses distally Dorsiflexion/Plantar flexion intact No cellulitis present Compartment soft Dressing - dressing C/D/I Motor Function - intact, moving foot and toes well on exam.  Hemovac pulled without difficulty.  Past Medical History:  Diagnosis Date  . Arthritis   . Cancer ([place])    skin melanoma  . Diverticulitis    2 bouts in 10 years  . Hyperlipemia   . Hypertension   . Hypothyroid   . Scoliosis   . Thyroid disease     Assessment/Plan: 1 Day Post-Op Procedure(s) (LRB): TOTAL HIP ARTHROPLASTY ANTERIOR APPROACH (Left) Active Problems:   OA (osteoarthritis) of hip  Estimated body mass index is 25.19 kg/m as calculated from the following:    Height as of this encounter: 5\' 11"  (1.803 m).   Weight as of this encounter: 81.9 kg. Advance diet Up with therapy D/C IV fluids when tolerating POs well  DVT Prophylaxis - Aspirin Weight Bearing As Tolerated  Hemovac Pulled  Start therapy today. May need two sessions depending on progress. Plan for DC home today. Follow up in office in 2 weeks. Discharge instructions given.   Ardeen Jourdain, PA-C Orthopaedic Surgery 04/15/2019, 7:14 AM

## 2019-04-15 NOTE — Discharge Summary (Signed)
Physician Discharge Summary   Patient ID: Jake Collins MRN: [id number] DOB/AGE: December 20, 1945 73 y.o.  Admit date: 04/14/2019 Discharge date: 04/15/2019  Primary Diagnosis: Primary osteoarthritis left hip   Admission Diagnoses:  Past Medical History:  Diagnosis Date  . Arthritis   . Cancer ([place])    skin melanoma  . Diverticulitis    2 bouts in 10 years  . Hyperlipemia   . Hypertension   . Hypothyroid   . Scoliosis   . Thyroid disease    Discharge Diagnoses:   Active Problems:   OA (osteoarthritis) of hip  Estimated body mass index is 25.19 kg/m as calculated from the following:   Height as of this encounter: 5\' 11"  (1.803 m).   Weight as of this encounter: 81.9 kg.  Procedure(s) (LRB): TOTAL HIP ARTHROPLASTY ANTERIOR APPROACH (Left)   Consults: None  HPI: Jake Collins, 73 y.o. male, has a history of pain and functional disability in the left hip(s) due to arthritis and patient has failed non-surgical conservative treatments for greater than 12 weeks to include NSAID's and/or analgesics, corticosteriod injections, flexibility and strengthening excercises and activity modification.  Onset of symptoms was gradual starting 6 years ago with gradually worsening course since that time.The patient noted no past surgery on the left hip(s).  Patient currently rates pain in the left hip at 7 out of 10 with activity. Patient has night pain, worsening of pain with activity and weight bearing, pain that interfers with activities of daily living and pain with passive range of motion. Patient has evidence of periarticular osteophytes and joint space narrowing by imaging studies. This condition presents safety issues increasing the risk of falls. There is no current active infection.  Laboratory Data: Admission on 04/14/2019, Discharged on 04/15/2019  Component Date Value Ref Range Status  . WBC 04/15/2019 13.3* 4.0 - 10.5 K/uL Final  . RBC 04/15/2019 4.73  4.22 - 5.81 MIL/uL Final  .  Hemoglobin 04/15/2019 14.6  13.0 - 17.0 g/dL Final  . HCT 04/15/2019 44.2  39.0 - 52.0 % Final  . MCV 04/15/2019 93.4  80.0 - 100.0 fL Final  . MCH 04/15/2019 30.9  26.0 - 34.0 pg Final  . MCHC 04/15/2019 33.0  30.0 - 36.0 g/dL Final  . RDW 04/15/2019 12.4  11.5 - 15.5 % Final  . Platelets 04/15/2019 202  150 - 400 K/uL Final  . nRBC 04/15/2019 0.0  0.0 - 0.2 % Final   Performed at [facility], [place] [address], [place], [place] [address]  . Sodium 04/15/2019 137  135 - 145 mmol/L Final  . Potassium 04/15/2019 4.1  3.5 - 5.1 mmol/L Final  . Chloride 04/15/2019 104  98 - 111 mmol/L Final  . CO2 04/15/2019 22  22 - 32 mmol/L Final  . Glucose, Bld 04/15/2019 158* 70 - 99 mg/dL Final  . BUN 04/15/2019 17  8 - 23 mg/dL Final  . Creatinine, Ser 04/15/2019 1.15  0.61 - 1.24 mg/dL Final  . Calcium 04/15/2019 8.6* 8.9 - 10.3 mg/dL Final  . GFR calc non Af Amer 04/15/2019 >60  >60 mL/min Final  . GFR calc Af Amer 04/15/2019 >60  >60 mL/min Final  . Anion gap 04/15/2019 11  5 - 15 Final   Performed at [facility], [place] [address], [place], [place] [address]  Hospital Outpatient Visit on 04/10/2019  Component Date Value Ref Range Status  . SARS-CoV-2, NAA 04/10/2019 NOT DETECTED  NOT DETECTED Final   Comment: (  NOTE) This nucleic acid amplification test was developed and its performance characteristics determined by [employer]. Nucleic acid amplification tests include PCR and TMA. This test has not been FDA cleared or approved. This test has been authorized by FDA under an Emergency Use Authorization (EUA). This test is only authorized for the duration of time the declaration that circumstances exist justifying the authorization of the emergency use of in vitro diagnostic tests for detection of SARS-CoV-2 virus and/or diagnosis of COVID-19 infection under section 564(b)(1) of the Act, 21 U.S.C. [id number]) (1), unless the authorization is terminated  or revoked sooner. When diagnostic testing is negative, the possibility of a false negative result should be considered in the context of a patient's recent exposures and the presence of clinical signs and symptoms consistent with COVID-19. An individual without symptoms of COVID- 19 and who is not shedding SARS-CoV-2 vi                          rus would expect to have a negative (not detected) result in this assay. Performed At: [facility] [place], [place] [id number] Rush Farmer MD [id number]   . Coronavirus Source 04/10/2019 NASOPHARYNGEAL   Final   Performed at [facility], [place] [address], [place], [place] [address]  Hospital Outpatient Visit on 04/06/2019  Component Date Value Ref Range Status  . aPTT 04/06/2019 30  24 - 36 seconds Final   Performed at [facility], [place] [address] [place], [place] [address]  . WBC 04/06/2019 6.3  4.0 - 10.5 K/uL Final  . RBC 04/06/2019 5.13  4.22 - 5.81 MIL/uL Final  . Hemoglobin 04/06/2019 15.9  13.0 - 17.0 g/dL Final  . HCT 04/06/2019 47.9  39.0 - 52.0 % Final  . MCV 04/06/2019 93.4  80.0 - 100.0 fL Final  . MCH 04/06/2019 31.0  26.0 - 34.0 pg Final  . MCHC 04/06/2019 33.2  30.0 - 36.0 g/dL Final  . RDW 04/06/2019 12.4  11.5 - 15.5 % Final  . Platelets 04/06/2019 193  150 - 400 K/uL Final  . nRBC 04/06/2019 0.0  0.0 - 0.2 % Final  . Neutrophils Relative % 04/06/2019 59  % Final  . Neutro Abs 04/06/2019 3.7  1.7 - 7.7 K/uL Final  . Lymphocytes Relative 04/06/2019 24  % Final  . Lymphs Abs 04/06/2019 1.5  0.7 - 4.0 K/uL Final  . Monocytes Relative 04/06/2019 10  % Final  . Monocytes Absolute 04/06/2019 0.7  0.1 - 1.0 K/uL Final  . Eosinophils Relative 04/06/2019 6  % Final  . Eosinophils Absolute 04/06/2019 0.4  0.0 - 0.5 K/uL Final  . Basophils Relative 04/06/2019 1  % Final  . Basophils Absolute 04/06/2019 0.1  0.0 - 0.1 K/uL Final  . Immature Granulocytes 04/06/2019 0  % Final   . Abs Immature Granulocytes 04/06/2019 0.01  0.00 - 0.07 K/uL Final   Performed at [facility], [place] [address], [place],  [address]  . Sodium 04/06/2019 139  135 - 145 mmol/L Final  . Potassium 04/06/2019 4.5  3.5 - 5.1 mmol/L Final  . Chloride 04/06/2019 106  98 - 111 mmol/L Final  . CO2 04/06/2019 24  22 - 32 mmol/L Final  . Glucose, Bld 04/06/2019 96  70 - 99 mg/dL Final  . BUN 04/06/2019 21  8 - 23 mg/dL Final  . Creatinine, Ser 04/06/2019 1.09  0.61 - 1.24 mg/dL Final  .  Calcium 04/06/2019 9.3  8.9 - 10.3 mg/dL Final  . Total Protein 04/06/2019 7.0  6.5 - 8.1 g/dL Final  . Albumin 04/06/2019 4.2  3.5 - 5.0 g/dL Final  . AST 04/06/2019 26  15 - 41 U/L Final  . ALT 04/06/2019 24  0 - 44 U/L Final  . Alkaline Phosphatase 04/06/2019 62  38 - 126 U/L Final  . Total Bilirubin 04/06/2019 0.5  0.3 - 1.2 mg/dL Final  . GFR calc non Af Amer 04/06/2019 >60  >60 mL/min Final  . GFR calc Af Amer 04/06/2019 >60  >60 mL/min Final  . Anion gap 04/06/2019 9  5 - 15 Final   Performed at [facility], [place] [address], [place], [place] [address]  . Prothrombin Time 04/06/2019 13.3  11.4 - 15.2 seconds Final  . INR 04/06/2019 1.0  0.8 - 1.2 Final   Comment: (NOTE) INR goal varies based on device and disease states. Performed at [facility], [place] [address] [place], [place] [address]   . ABO/RH(D) 04/06/2019 O NEG   Final  . Antibody Screen 04/06/2019 NEG   Final  . Sample Expiration 04/06/2019 04/17/2019,2359   Final  . Extend sample reason 04/06/2019    Final                   Value:NO TRANSFUSIONS OR PREGNANCY IN THE PAST 3 MONTHS Performed at [facility], [place] [address], [place], [place] [address]   . ABO/RH(D) 04/06/2019    Final                   Value:O NEG Performed at [facility], [place] [address] [place], [place] [address]   . MRSA, PCR 04/06/2019 NEGATIVE  NEGATIVE Final  .  Staphylococcus aureus 04/06/2019 NEGATIVE  NEGATIVE Final   Comment: (NOTE) The Xpert SA Assay (FDA approved for NASAL specimens in patients 4 years of age and older), is one component of a comprehensive surveillance program. It is not intended to diagnose infection nor to guide or monitor treatment. Performed at [facility], [place] [address], [place], [place] [address]      X-Rays:Dg Pelvis Portable  Result Date: 04/14/2019 CLINICAL DATA:  Status post left hip replacement, PACU imaged. EXAM: PORTABLE PELVIS 1-2 VIEWS COMPARISON:  Intraoperative images and preoperative images, most recent comparison of 04/14/2019 FINDINGS: Postoperative changes of left total hip arthroplasty without complication. Surgical drain in the soft tissues overlying the site. IMPRESSION: Postoperative changes without complicating features related to left total hip arthroplasty. Electronically Signed   By: Zetta Bills M.D.   On: 04/14/2019 17:33   Dg C-arm 1-60 Min-no Report  Result Date: 04/14/2019 CLINICAL DATA:  Left hip arthroplasty. EXAM: OPERATIVE LEFT HIP (WITH PELVIS IF PERFORMED) TECHNIQUE: Fluoroscopic spot image(s) were submitted for interpretation post-operatively. Fluoroscopy time was 17 seconds. COMPARISON:  Left hip x-rays dated November 25, 2014. FINDINGS: Intraoperative fluoroscopic images demonstrate interval left total hip arthroplasty. Components are well aligned. No acute osseous abnormality. IMPRESSION: Intraoperative fluoroscopic guidance for left total hip arthroplasty. Electronically Signed   By: Titus Dubin M.D.   On: 04/14/2019 15:44   Dg Hip Operative Unilat W Or W/o Pelvis Left  Result Date: 04/14/2019 CLINICAL DATA:  Left hip arthroplasty. EXAM: OPERATIVE LEFT HIP (WITH PELVIS IF PERFORMED) TECHNIQUE: Fluoroscopic spot image(s) were submitted for interpretation post-operatively. Fluoroscopy time was 17 seconds. COMPARISON:  Left hip x-rays dated November 25, 2014. FINDINGS:  Intraoperative  fluoroscopic images demonstrate interval left total hip arthroplasty. Components are well aligned. No acute osseous abnormality. IMPRESSION: Intraoperative fluoroscopic guidance for left total hip arthroplasty. Electronically Signed   By: Titus Dubin M.D.   On: 04/14/2019 15:44    EKG: Orders placed or performed in visit on 04/01/19  . EKG 12-Lead     Hospital Course: Patient was admitted to [facility] and taken to the OR and underwent the above state procedure without complications.  Patient tolerated the procedure well and was later transferred to the recovery room and then to the orthopaedic floor for postoperative care.  They were given PO and IV analgesics for pain control following their surgery.  They were given 24 hours of postoperative antibiotics of  Anti-infectives (From admission, onward)   Start     Dose/Rate Route Frequency Ordered Stop   04/14/19 2000  ceFAZolin (ANCEF) IVPB 2g/100 mL premix     2 g 200 mL/hr over 30 Minutes Intravenous Every 6 hours 04/14/19 1603 04/15/19 0232   04/14/19 1230  ceFAZolin (ANCEF) IVPB 2g/100 mL premix     2 g 200 mL/hr over 30 Minutes Intravenous On call to O.R. 04/14/19 1225 04/14/19 1432     and started on DVT prophylaxis in the form of Aspirin.   PT and OT were ordered for total hip protocol.  The patient was allowed to be WBAT with therapy. Discharge planning was consulted to help with postop disposition and equipment needs.  Patient had a good night on the evening of surgery.  They started to get up OOB with therapy on day one.  Hemovac drain was pulled without difficulty. The patient had progressed with therapy and meeting their goals.  Incision was healing well.  Patient was seen in rounds and was ready to go home.  Diet: Cardiac diet Activity:WBAT Follow-up:in 2 weeks Disposition - Home Discharged Condition: stable   Discharge Instructions    Call MD / Call 911   Complete by: As directed    If you  experience chest pain or shortness of breath, CALL 911 and be transported to the hospital emergency room.  If you develope a fever above 101 F, pus (white drainage) or increased drainage or redness at the wound, or calf pain, call your surgeon's office.   Constipation Prevention   Complete by: As directed    Drink plenty of fluids.  Prune juice may be helpful.  You may use a stool softener, such as Colace (over the counter) 100 mg twice a day.  Use MiraLax (over the counter) for constipation as needed.   Diet - low sodium heart healthy   Complete by: As directed    Discharge instructions   Complete by: As directed    Dr. Gaynelle Arabian Total Joint Specialist [facility] [address], [place], [place] [address] [phone]  ANTERIOR APPROACH TOTAL HIP REPLACEMENT POSTOPERATIVE DIRECTIONS   Hip Rehabilitation, Guidelines Following Surgery  The results of a hip operation are greatly improved after range of motion and muscle strengthening exercises. Follow all safety measures which are given to protect your hip. If any of these exercises cause increased pain or swelling in your joint, decrease the amount until you are comfortable again. Then slowly increase the exercises. Call your caregiver if you have problems or questions.   HOME CARE INSTRUCTIONS  Remove items at home which could result in a fall. This includes throw rugs or furniture in walking pathways.  ICE to the affected hip every  three hours for 30 minutes at a time and then as needed for pain and swelling.  Continue to use ice on the hip for pain and swelling from surgery. You may notice swelling that will progress down to the foot and ankle.  This is normal after surgery.  Elevate the leg when you are not up walking on it.   Continue to use the breathing machine which will help keep your temperature down.  It is common for your temperature to cycle up and down following surgery, especially at night when you are not up  moving around and exerting yourself.  The breathing machine keeps your lungs expanded and your temperature down.   DIET You may resume your previous home diet once your are discharged from the hospital.  DRESSING / WOUND CARE / SHOWERING You may shower 3 days after surgery, but keep the wounds dry during showering.  You may use an occlusive plastic wrap (Press'n Seal for example), NO SOAKING/SUBMERGING IN THE BATHTUB.  If the bandage gets wet, change with a clean dry gauze.  If the incision gets wet, pat the wound dry with a clean towel. You may start showering once you are discharged home but do not submerge the incision under water. Just pat the incision dry and apply a dry gauze dressing on daily. Change the surgical dressing daily and reapply a dry dressing each time.  ACTIVITY Walk with your walker as instructed. Use walker as long as suggested by your caregivers. Avoid periods of inactivity such as sitting longer than an hour when not asleep. This helps prevent blood clots.  You may resume a sexual relationship in one month or when given the OK by your doctor.  You may return to work once you are cleared by your doctor.  Do not drive a car for 6 weeks or until released by you surgeon.  Do not drive while taking narcotics.  WEIGHT BEARING Weight bearing as tolerated with assist device (walker, cane, etc) as directed, use it as long as suggested by your surgeon or therapist, typically at least 4-6 weeks.  POSTOPERATIVE CONSTIPATION PROTOCOL Constipation - defined medically as fewer than three stools per week and severe constipation as less than one stool per week.  One of the most common issues patients have following surgery is constipation.  Even if you have a regular bowel pattern at home, your normal regimen is likely to be disrupted due to multiple reasons following surgery.  Combination of anesthesia, postoperative narcotics, change in appetite and fluid intake all can affect your  bowels.  In order to avoid complications following surgery, here are some recommendations in order to help you during your recovery period.  Colace (docusate) - Pick up an over-the-counter form of Colace or another stool softener and take twice a day as long as you are requiring postoperative pain medications.  Take with a full glass of water daily.  If you experience loose stools or diarrhea, hold the colace until you stool forms back up.  If your symptoms do not get better within 1 week or if they get worse, check with your doctor.  Dulcolax (bisacodyl) - Pick up over-the-counter and take as directed by the product packaging as needed to assist with the movement of your bowels.  Take with a full glass of water.  Use this product as needed if not relieved by Colace only.   MiraLax (polyethylene glycol) - Pick up over-the-counter to have on hand.  MiraLax is a solution that  will increase the amount of water in your bowels to assist with bowel movements.  Take as directed and can mix with a glass of water, juice, soda, coffee, or tea.  Take if you go more than two days without a movement. Do not use MiraLax more than once per day. Call your doctor if you are still constipated or irregular after using this medication for 7 days in a row.  If you continue to have problems with postoperative constipation, please contact the office for further assistance and recommendations.  If you experience "the worst abdominal pain ever" or develop nausea or vomiting, please contact the office immediatly for further recommendations for treatment.  ITCHING  If you experience itching with your medications, try taking only a single pain pill, or even half a pain pill at a time.  You can also use Benadryl over the counter for itching or also to help with sleep.   TED HOSE STOCKINGS Wear the elastic stockings on both legs for three weeks following surgery during the day but you may remove then at night for sleeping.   MEDICATIONS See your medication summary on the "After Visit Summary" that the nursing staff will review with you prior to discharge.  You may have some home medications which will be placed on hold until you complete the course of blood thinner medication.  It is important for you to complete the blood thinner medication as prescribed by your surgeon.  Continue your approved medications as instructed at time of discharge.  PRECAUTIONS If you experience chest pain or shortness of breath - call 911 immediately for transfer to the hospital emergency department.  If you develop a fever greater that 101 F, purulent drainage from wound, increased redness or drainage from wound, foul odor from the wound/dressing, or calf pain - CONTACT YOUR SURGEON.                                                   FOLLOW-UP APPOINTMENTS Make sure you keep all of your appointments after your operation with your surgeon and caregivers. You should call the office at the above phone number and make an appointment for approximately two weeks after the date of your surgery or on the date instructed by your surgeon outlined in the "After Visit Summary".  RANGE OF MOTION AND STRENGTHENING EXERCISES  These exercises are designed to help you keep full movement of your hip joint. Follow your caregiver's or physical therapist's instructions. Perform all exercises about fifteen times, three times per day or as directed. Exercise both hips, even if you have had only one joint replacement. These exercises can be done on a training (exercise) mat, on the floor, on a table or on a bed. Use whatever works the best and is most comfortable for you. Use music or television while you are exercising so that the exercises are a pleasant break in your day. This will make your life better with the exercises acting as a break in routine you can look forward to.  Lying on your back, slowly slide your foot toward your buttocks, raising your knee up off  the floor. Then slowly slide your foot back down until your leg is straight again.  Lying on your back spread your legs as far apart as you can without causing discomfort.  Lying on your side, raise  your upper leg and foot straight up from the floor as far as is comfortable. Slowly lower the leg and repeat.  Lying on your back, tighten up the muscle in the front of your thigh (quadriceps muscles). You can do this by keeping your leg straight and trying to raise your heel off the floor. This helps strengthen the largest muscle supporting your knee.  Lying on your back, tighten up the muscles of your buttocks both with the legs straight and with the knee bent at a comfortable angle while keeping your heel on the floor.   IF YOU ARE TRANSFERRED TO A SKILLED REHAB FACILITY If the patient is transferred to a skilled rehab facility following release from the hospital, a list of the current medications will be sent to the facility for the patient to continue.  When discharged from the skilled rehab facility, please have the facility set up the patient's [place] prior to being released. Also, the skilled facility will be responsible for providing the patient with their medications at time of release from the facility to include their pain medication, the muscle relaxants, and their blood thinner medication. If the patient is still at the rehab facility at time of the two week follow up appointment, the skilled rehab facility will also need to assist the patient in arranging follow up appointment in our office and any transportation needs.  MAKE SURE YOU:  Understand these instructions.  Get help right away if you are not doing well or get worse.    Pick up stool softner and laxative for home use following surgery while on pain medications. Do not submerge incision under water. Please use good hand washing techniques while changing dressing each day. May shower starting three days after  surgery. Please use a clean towel to pat the incision dry following showers. Continue to use ice for pain and swelling after surgery. Do not use any lotions or creams on the incision until instructed by your surgeon.   Increase activity slowly as tolerated   Complete by: As directed      Allergies as of 04/15/2019      Reactions   Sulfa Antibiotics    Katherina Right syndrome       Medication List    STOP taking these medications   naproxen sodium 220 MG tablet Commonly known as: ALEVE     TAKE these medications   aspirin 325 MG EC tablet Take 1 tablet (325 mg total) by mouth 2 (two) times daily.   B-12 5000 MCG Caps Take 5,000 mcg by mouth daily.   cholecalciferol 25 MCG (1000 UT) tablet Commonly known as: VITAMIN D3 Take 1,000 Units by mouth daily.   dorzolamide 2 % ophthalmic solution Commonly known as: TRUSOPT Place 1 drop into the left eye 2 (two) times daily.   EPINEPHrine 0.3 mg/0.3 mL Soaj injection Commonly known as: EPI-PEN Inject 0.3 mg into the muscle as needed for anaphylaxis.   famotidine 20 MG tablet Commonly known as: PEPCID Take 20 mg by mouth daily.   glucosamine-chondroitin 500-400 MG tablet Take 1 tablet by mouth daily.   HYDROcodone-acetaminophen 5-325 MG tablet Commonly known as: NORCO/VICODIN Take 1-2 tablets by mouth every 6 (six) hours as needed for moderate pain (pain score 4-6).   latanoprost 0.005 % ophthalmic solution Commonly known as: XALATAN Place 1 drop into both eyes at bedtime.   levothyroxine 100 MCG tablet Commonly known as: SYNTHROID Take 100 mcg by mouth daily.   methocarbamol  500 MG tablet Commonly known as: ROBAXIN Take 1 tablet (500 mg total) by mouth every 6 (six) hours as needed for muscle spasms.   nebivolol 5 MG tablet Commonly known as: BYSTOLIC Take 5 mg by mouth daily.   pravastatin 10 MG tablet Commonly known as: PRAVACHOL Take 10 mg by mouth every Monday, Wednesday, and Friday.   PROBIOTIC PO Take  1 tablet by mouth daily.   pyridOXINE 100 MG tablet Commonly known as: VITAMIN B-6 Take 100 mg by mouth daily.   venlafaxine 50 MG tablet Commonly known as: EFFEXOR Take 1 tablet (50 mg total) by mouth daily.      Follow-up Information    Gaynelle Arabian, MD. Schedule an appointment as soon as possible for a visit on 04/29/2019.   Specialty: Orthopedic Surgery Contact information: [address] [place] [address] [phone]           Signed: Ardeen Jourdain, PA-C Orthopaedic Surgery 04/15/2019, 12:22 PM

## 2019-04-23 DIAGNOSIS — R05 Cough: Secondary | ICD-10-CM | POA: Diagnosis not present

## 2019-05-10 DIAGNOSIS — R05 Cough: Secondary | ICD-10-CM | POA: Diagnosis not present

## 2019-05-17 DIAGNOSIS — R05 Cough: Secondary | ICD-10-CM | POA: Diagnosis not present

## 2019-05-18 DIAGNOSIS — Z471 Aftercare following joint replacement surgery: Secondary | ICD-10-CM | POA: Diagnosis not present

## 2019-05-18 DIAGNOSIS — Z96642 Presence of left artificial hip joint: Secondary | ICD-10-CM | POA: Diagnosis not present

## 2019-05-24 DIAGNOSIS — R05 Cough: Secondary | ICD-10-CM | POA: Diagnosis not present

## 2019-05-31 DIAGNOSIS — R05 Cough: Secondary | ICD-10-CM | POA: Diagnosis not present

## 2019-06-07 DIAGNOSIS — R05 Cough: Secondary | ICD-10-CM | POA: Diagnosis not present

## 2019-06-14 DIAGNOSIS — R05 Cough: Secondary | ICD-10-CM | POA: Diagnosis not present

## 2019-06-22 DIAGNOSIS — R972 Elevated prostate specific antigen [PSA]: Secondary | ICD-10-CM | POA: Diagnosis not present

## 2019-06-22 DIAGNOSIS — E785 Hyperlipidemia, unspecified: Secondary | ICD-10-CM | POA: Diagnosis not present

## 2019-06-28 DIAGNOSIS — R05 Cough: Secondary | ICD-10-CM | POA: Diagnosis not present

## 2019-06-29 DIAGNOSIS — E7800 Pure hypercholesterolemia, unspecified: Secondary | ICD-10-CM | POA: Diagnosis not present

## 2019-06-29 DIAGNOSIS — R972 Elevated prostate specific antigen [PSA]: Secondary | ICD-10-CM | POA: Diagnosis not present

## 2019-06-29 DIAGNOSIS — E039 Hypothyroidism, unspecified: Secondary | ICD-10-CM | POA: Diagnosis not present

## 2019-07-05 DIAGNOSIS — R05 Cough: Secondary | ICD-10-CM | POA: Diagnosis not present

## 2019-07-06 DIAGNOSIS — E875 Hyperkalemia: Secondary | ICD-10-CM | POA: Diagnosis not present

## 2019-07-06 DIAGNOSIS — E785 Hyperlipidemia, unspecified: Secondary | ICD-10-CM | POA: Diagnosis not present

## 2019-07-06 DIAGNOSIS — I10 Essential (primary) hypertension: Secondary | ICD-10-CM | POA: Diagnosis not present

## 2019-07-06 MED FILL — REPATHA SURECLICK 140 MG/ML: 140 | 28 days supply | Qty: 2 | Fill #0

## 2019-07-08 DIAGNOSIS — M62838 Other muscle spasm: Secondary | ICD-10-CM | POA: Diagnosis not present

## 2019-07-13 DIAGNOSIS — Z86010 Personal history of colonic polyps: Secondary | ICD-10-CM | POA: Diagnosis not present

## 2019-07-19 DIAGNOSIS — R05 Cough: Secondary | ICD-10-CM | POA: Diagnosis not present

## 2019-07-26 DIAGNOSIS — R05 Cough: Secondary | ICD-10-CM | POA: Diagnosis not present

## 2019-07-27 DIAGNOSIS — M25559 Pain in unspecified hip: Secondary | ICD-10-CM | POA: Diagnosis not present

## 2019-08-02 DIAGNOSIS — R05 Cough: Secondary | ICD-10-CM | POA: Diagnosis not present

## 2019-08-09 DIAGNOSIS — R05 Cough: Secondary | ICD-10-CM | POA: Diagnosis not present

## 2019-08-10 ENCOUNTER — Other Ambulatory Visit: Payer: Self-pay

## 2019-08-10 MED ORDER — VENLAFAXINE HCL 50 MG PO TABS: 50.0000 mg | ORAL_TABLET | Freq: Every day | ORAL | 1 refills | Status: DC

## 2019-08-16 DIAGNOSIS — R05 Cough: Secondary | ICD-10-CM | POA: Diagnosis not present

## 2019-08-18 DIAGNOSIS — E785 Hyperlipidemia, unspecified: Secondary | ICD-10-CM | POA: Diagnosis not present

## 2019-08-23 DIAGNOSIS — R05 Cough: Secondary | ICD-10-CM | POA: Diagnosis not present

## 2019-08-24 DIAGNOSIS — N529 Male erectile dysfunction, unspecified: Secondary | ICD-10-CM | POA: Diagnosis not present

## 2019-08-24 DIAGNOSIS — R351 Nocturia: Secondary | ICD-10-CM | POA: Diagnosis not present

## 2019-08-24 DIAGNOSIS — R350 Frequency of micturition: Secondary | ICD-10-CM | POA: Diagnosis not present

## 2019-08-30 DIAGNOSIS — R05 Cough: Secondary | ICD-10-CM | POA: Diagnosis not present

## 2019-09-06 DIAGNOSIS — R05 Cough: Secondary | ICD-10-CM | POA: Diagnosis not present

## 2019-09-08 DIAGNOSIS — H5213 Myopia, bilateral: Secondary | ICD-10-CM | POA: Diagnosis not present

## 2019-09-08 DIAGNOSIS — H401123 Primary open-angle glaucoma, left eye, severe stage: Secondary | ICD-10-CM | POA: Diagnosis not present

## 2019-09-08 DIAGNOSIS — H401111 Primary open-angle glaucoma, right eye, mild stage: Secondary | ICD-10-CM | POA: Diagnosis not present

## 2019-09-08 DIAGNOSIS — Z961 Presence of intraocular lens: Secondary | ICD-10-CM | POA: Diagnosis not present

## 2019-09-09 DIAGNOSIS — Z1159 Encounter for screening for other viral diseases: Secondary | ICD-10-CM | POA: Diagnosis not present

## 2019-09-14 ENCOUNTER — Ambulatory Visit
Admission: RE | Admit: 2019-09-14 | Discharge: 2019-09-14 | Disposition: A | Discharge status: Home / Self Care | Payer: PPO | Source: Ambulatory Visit | Attending: Gastroenterology | Admitting: Gastroenterology

## 2019-09-14 ENCOUNTER — Other Ambulatory Visit: Payer: Self-pay | Admitting: Gastroenterology

## 2019-09-14 DIAGNOSIS — Z86010 Personal history of colonic polyps: Secondary | ICD-10-CM | POA: Diagnosis not present

## 2019-09-14 DIAGNOSIS — Z1211 Encounter for screening for malignant neoplasm of colon: Secondary | ICD-10-CM

## 2019-09-14 DIAGNOSIS — K5730 Diverticulosis of large intestine without perforation or abscess without bleeding: Secondary | ICD-10-CM | POA: Diagnosis not present

## 2019-09-20 DIAGNOSIS — R05 Cough: Secondary | ICD-10-CM | POA: Diagnosis not present

## 2019-09-27 DIAGNOSIS — R05 Cough: Secondary | ICD-10-CM | POA: Diagnosis not present

## 2019-10-04 DIAGNOSIS — R05 Cough: Secondary | ICD-10-CM | POA: Diagnosis not present

## 2019-10-11 DIAGNOSIS — R05 Cough: Secondary | ICD-10-CM | POA: Diagnosis not present

## 2019-10-18 DIAGNOSIS — R05 Cough: Secondary | ICD-10-CM | POA: Diagnosis not present

## 2019-10-25 DIAGNOSIS — R05 Cough: Secondary | ICD-10-CM | POA: Diagnosis not present

## 2019-11-05 DIAGNOSIS — M25562 Pain in left knee: Secondary | ICD-10-CM | POA: Diagnosis not present

## 2019-11-05 DIAGNOSIS — M25561 Pain in right knee: Secondary | ICD-10-CM | POA: Diagnosis not present

## 2019-11-09 DIAGNOSIS — R05 Cough: Secondary | ICD-10-CM | POA: Diagnosis not present

## 2019-11-15 DIAGNOSIS — R05 Cough: Secondary | ICD-10-CM | POA: Diagnosis not present

## 2019-11-22 DIAGNOSIS — R05 Cough: Secondary | ICD-10-CM | POA: Diagnosis not present

## 2019-12-06 DIAGNOSIS — R05 Cough: Secondary | ICD-10-CM | POA: Diagnosis not present

## 2019-12-07 ENCOUNTER — Encounter: Payer: Self-pay | Admitting: Psychiatry

## 2019-12-07 ENCOUNTER — Ambulatory Visit (INDEPENDENT_AMBULATORY_CARE_PROVIDER_SITE_OTHER): Payer: PPO | Admitting: Psychiatry

## 2019-12-07 ENCOUNTER — Other Ambulatory Visit: Payer: Self-pay

## 2019-12-07 DIAGNOSIS — F3342 Major depressive disorder, recurrent, in full remission: Secondary | ICD-10-CM

## 2019-12-07 DIAGNOSIS — F411 Generalized anxiety disorder: Secondary | ICD-10-CM | POA: Diagnosis not present

## 2019-12-07 MED ORDER — VENLAFAXINE HCL 25 MG PO TABS: ORAL_TABLET | ORAL | 0 refills | Status: DC

## 2019-12-07 MED ORDER — LITHIUM CARBONATE 150 MG PO CAPS: 150.0000 mg | ORAL_CAPSULE | Freq: Every evening | ORAL | 1 refills | Status: DC

## 2019-12-07 NOTE — Patient Instructions (Signed)
Reduce venlafaxine to 1/2 of 25 mg tablet for 3 weeks then stop it.  Start lithium 150 mg capsule 1 daily.

## 2019-12-07 NOTE — Progress Notes (Signed)
Jake Collins [id number] 02/20/1946 74 y.o.  Subjective:   Patient ID:  Jake Collins is a 74 y.o. (DOB 1946-06-10) male.  Chief Complaint:  Chief Complaint  Patient presents with  . Follow-up    mood and anxiety    HPI MADDOXX BURKITT presents to the office today for follow-up of major depression and GAD.  Last seen June 2020 and was doing well and no meds were changed.  The only psychiatric med for the last few years has been venlafaxine 50 mg daily.  12/07/2019 appointment with the following noted: Remained well.   Concerned about venlafaxine sexual SE.  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 Neither he nor wife notced change in mood anxiety since that time.   Has plenty of money and no longer interested in the money.   Wife is concerned he's moody.   Gym 3 times weekly.  Patient denies any recent difficulty with anxiety.  Patient denies difficulty with sleep initiation or maintenance. Denies appetite disturbance.  Patient reports that energy and motivation have been good.  Patient denies any difficulty with concentration.  Patient denies any suicidal ideation.   Past Psychiatric Medication Trials:  Lithium, Lexapro, Zoloft, Effexor XR 150, Klonopin   M history psych hosp and ECT.  F neglected him.  Review of Systems:  Review of Systems  Genitourinary:       ED  Neurological: Negative for tremors and weakness.    Medications: I have reviewed the patient's current medications.  Current Outpatient Medications  Medication Sig Dispense Refill  . aspirin EC 325 MG EC tablet Take 1 tablet (325 mg total) by mouth 2 (two) times daily. 40 tablet 0  . cholecalciferol (VITAMIN D3) 25 MCG (1000 UT) tablet Take 1,000 Units by mouth daily.    . Cyanocobalamin (B-12) 5000 MCG CAPS Take 5,000 mcg by mouth daily.    . dorzolamide (TRUSOPT) 2 % ophthalmic solution Place 1 drop into the left eye 2 (two) times daily.     Marland Kitchen EPINEPHrine 0.3 mg/0.3 mL IJ SOAJ injection Inject 0.3 mg into the muscle as needed for anaphylaxis.    . famotidine (PEPCID) 20 MG tablet Take 20 mg by mouth daily.    Marland Kitchen glucosamine-chondroitin 500-400 MG tablet Take 1 tablet by mouth daily.    . hydrochlorothiazide (MICROZIDE) 12.5 MG capsule Take 12.5 mg by mouth daily.    Marland Kitchen HYDROcodone-acetaminophen (NORCO/VICODIN) 5-325 MG tablet Take 1-2 tablets by mouth every 6 (six) hours as needed for moderate pain (pain score 4-6). 56 tablet 0  . latanoprost (XALATAN) 0.005 % ophthalmic solution Place 1 drop into both eyes at bedtime.    Marland Kitchen levothyroxine (SYNTHROID, LEVOTHROID) 100 MCG tablet Take 100 mcg by mouth daily.      . methocarbamol (ROBAXIN) 500 MG tablet Take 1 tablet (500 mg total) by mouth every 6 (six) hours as needed for muscle spasms. 40 tablet 0  . nebivolol (BYSTOLIC) 5 MG tablet Take 5 mg by mouth daily.    . Probiotic Product (PROBIOTIC PO) Take 1 tablet by mouth daily.    Marland Kitchen pyridOXINE (VITAMIN B-6) 100 MG tablet Take 100 mg by mouth daily.    Marland Kitchen venlafaxine (EFFEXOR) 50 MG tablet Take 1 tablet (50 mg total) by mouth daily. (Patient taking differently: Take 25 mg by mouth daily. ) 90 tablet 1   No current facility-administered medications for this visit.    Medication Side  Effects: sexual SE  Allergies:  Allergies  Allergen Reactions  . Sulfa Antibiotics     Katherina Right syndrome     Past Medical History:  Diagnosis Date  . Arthritis   . Cancer ([place])    skin melanoma  . Diverticulitis    2 bouts in 10 years  . Hyperlipemia   . Hypertension   . Hypothyroid   . Scoliosis   . Thyroid disease     Family History  Problem Relation Age of Onset  . Heart disease Father     Social History   Socioeconomic History  . Marital status: Married    Spouse name: Not on file  . Number of children: Not on file  . Years of education: Not on file  . Highest education level: Not on file  Occupational History  . Not on  file  Tobacco Use  . Smoking status: Never Smoker  . Smokeless tobacco: Never Used  Substance and Sexual Activity  . Alcohol use: No  . Drug use: No  . Sexual activity: Not Currently  Other Topics Concern  . Not on file  Social History Narrative  . Not on file   Social Determinants of Health   Financial Resource Strain:   . Difficulty of Paying Living Expenses:   Food Insecurity:   . Worried About Charity fundraiser in the Last Year:   . Arboriculturist in the Last Year:   Transportation Needs:   . Film/video editor (Medical):   Marland Kitchen Lack of Transportation (Non-Medical):   Physical Activity:   . Days of Exercise per Week:   . Minutes of Exercise per Session:   Stress:   . Feeling of Stress :   Social Connections:   . Frequency of Communication with Friends and Family:   . Frequency of Social Gatherings with Friends and Family:   . Attends Religious Services:   . Active Member of Clubs or Organizations:   . Attends Archivist Meetings:   Marland Kitchen Marital Status:   Intimate Partner Violence:   . Fear of Current or Ex-Partner:   . Emotionally Abused:   Marland Kitchen Physically Abused:   . Sexually Abused:     Past Medical History, Surgical history, Social history, and Family history were reviewed and updated as appropriate.   Please see review of systems for further details on the patient's review from today.   Objective:   Physical Exam:  There were no vitals taken for this visit.  Physical Exam Constitutional:      General: He is not in acute distress.    Appearance: He is well-developed.  Musculoskeletal:        General: No deformity.  Neurological:     Mental Status: He is alert and oriented to person, place, and time.     Coordination: Coordination normal.  Psychiatric:        Attention and Perception: Attention and perception normal. He does not perceive auditory or visual hallucinations.        Mood and Affect: Mood normal. Mood is not anxious or depressed.  Affect is not labile, blunt, angry or inappropriate.        Speech: Speech normal.        Behavior: Behavior normal.        Thought Content: Thought content normal. Thought content does not include homicidal or suicidal ideation. Thought content does not include homicidal or suicidal plan.        Cognition and  Memory: Cognition and memory normal.        Judgment: Judgment normal.     Comments: Insight intact. No delusions.  Denies markedly depressed but admits can be moody and wife concerns     Lab Review:     Component Value Date/Time   NA 137 04/15/2019 0308   K 4.1 04/15/2019 0308   CL 104 04/15/2019 0308   CO2 22 04/15/2019 0308   GLUCOSE 158 (H) 04/15/2019 0308   BUN 17 04/15/2019 0308   CREATININE 1.15 04/15/2019 0308   CALCIUM 8.6 (L) 04/15/2019 0308   PROT 7.0 04/06/2019 1224   ALBUMIN 4.2 04/06/2019 1224   AST 26 04/06/2019 1224   ALT 24 04/06/2019 1224   ALKPHOS 62 04/06/2019 1224   BILITOT 0.5 04/06/2019 1224   GFRNONAA >60 04/15/2019 0308   GFRAA >60 04/15/2019 0308       Component Value Date/Time   WBC 13.3 (H) 04/15/2019 0308   RBC 4.73 04/15/2019 0308   HGB 14.6 04/15/2019 0308   HCT 44.2 04/15/2019 0308   PLT 202 04/15/2019 0308   MCV 93.4 04/15/2019 0308   MCH 30.9 04/15/2019 0308   MCHC 33.0 04/15/2019 0308   RDW 12.4 04/15/2019 0308   LYMPHSABS 1.5 04/06/2019 1224   MONOABS 0.7 04/06/2019 1224   EOSABS 0.4 04/06/2019 1224   BASOSABS 0.1 04/06/2019 1224    No results found for: POCLITH, LITHIUM   No results found for: PHENYTOIN, PHENOBARB, VALPROATE, CBMZ   .res Assessment: Plan:    Masaichi was seen today for follow-up.  Diagnoses and all orders for this visit:  Depression, major, recurrent, in complete remission ([place])  Generalized anxiety disorder   Cont med long term DT high relapse risk.  Has a long history of depression and anxiety.  Has had multiple relapses before.  Been under my care since 2006.   He's reduced venlafaxine to  25 mg daily and still complains of sexual SE but wife complains he's moody.  Option lamotrigine, lithium alone, Viibryd. Lithium 150 mg daily for mood and wean off venlafaxine  By 12.5 mg daily for 3 weeks and stop it DT sexual SE.  Follow-up 2 mos  Stephanie Acre, MD, DFAPA  Please see After Visit Summary for patient specific instructions.  No future appointments.  No orders of the defined types were placed in this encounter.   -------------------------------

## 2019-12-11 IMAGING — DX DG PORTABLE PELVIS
1 series · 1 of 1 positions shown · non-contrast
Comparison: Intraoperative images and preoperative images, most
recent comparison of 04/14/2019

CLINICAL DATA: Status post left hip replacement, PACU imaged.

EXAM:
PORTABLE PELVIS 1-2 VIEWS

[pelvis ap]
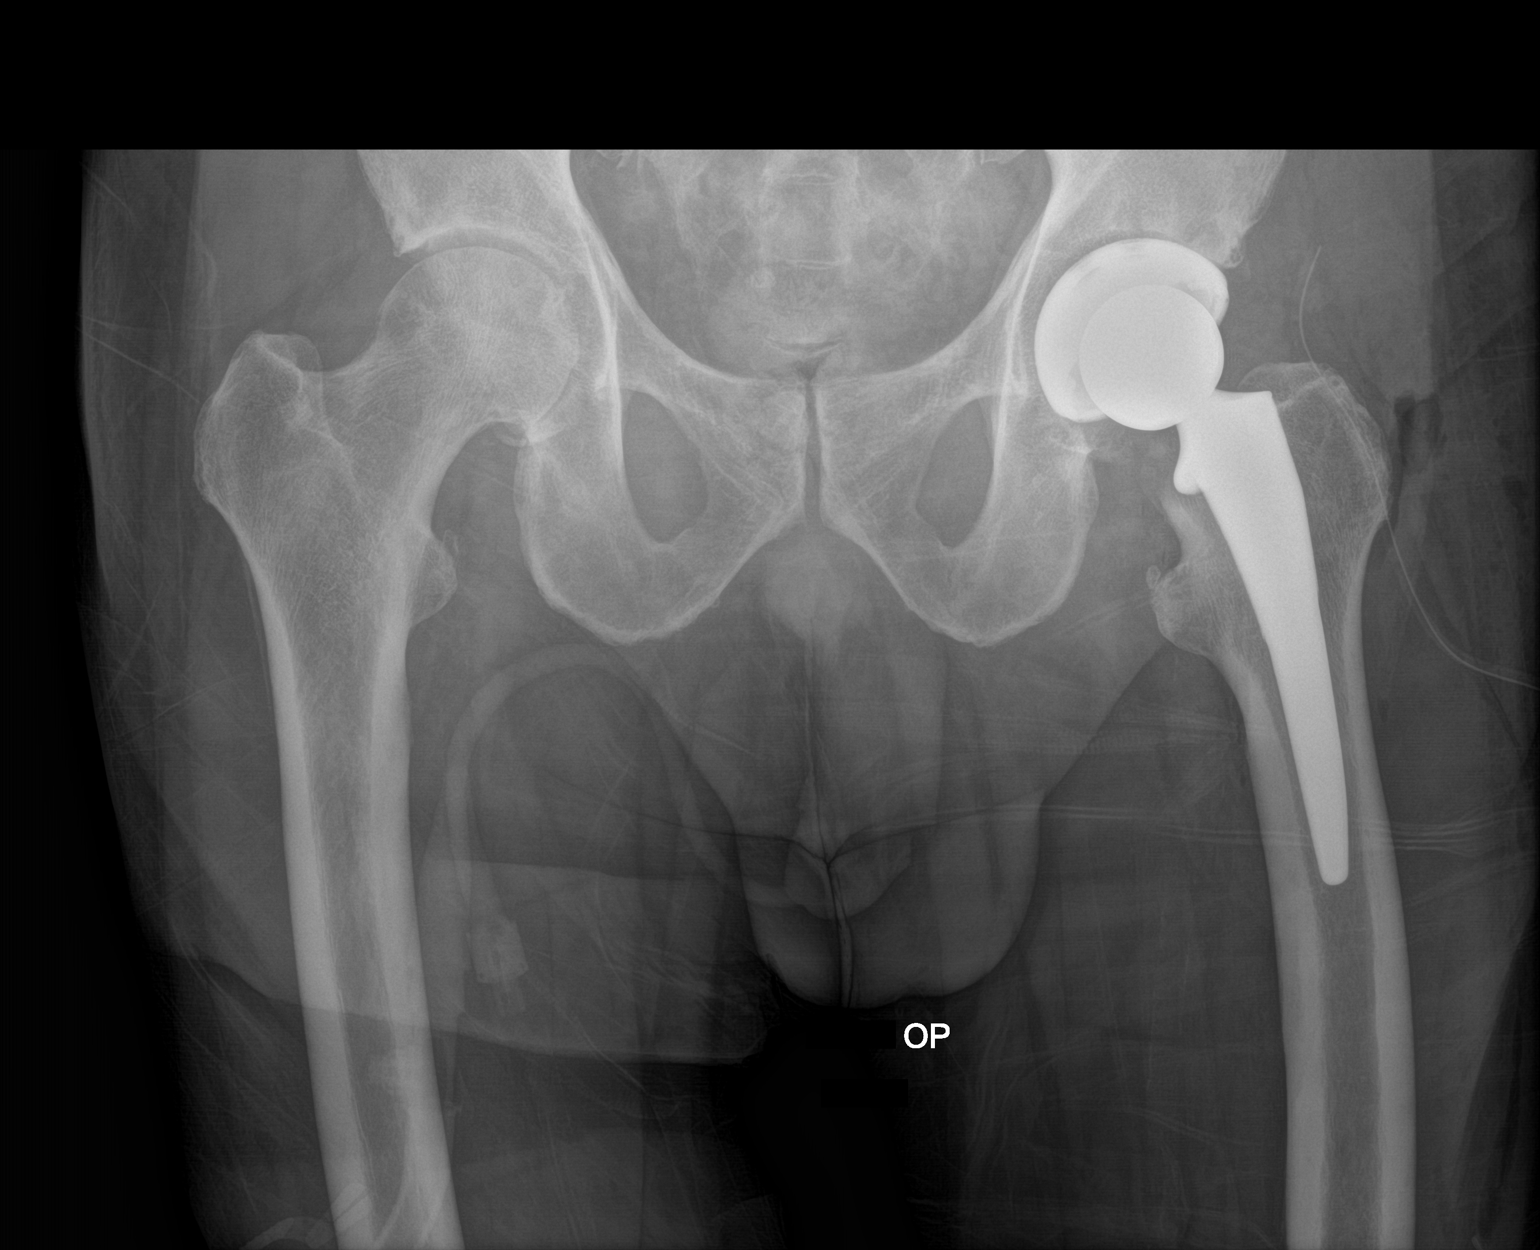

[1 of 1 positions shown; findings below may reference images not displayed]

FINDINGS: Postoperative changes of left total hip arthroplasty without
complication. Surgical drain in the soft tissues overlying the site.
IMPRESSION: Postoperative changes without complicating features related to left
total hip arthroplasty.

## 2019-12-20 ENCOUNTER — Telehealth: Payer: Self-pay | Admitting: Internal Medicine

## 2019-12-20 NOTE — Telephone Encounter (Signed)
Patient called in wanting to know if he can be scheduled with Dr.Duncan as a new patient. Did advise patient provider is not currently accepting. Gave names of PCP's accepting at [facility]. Patient wanting to see Dr.Duncan. Please advise.

## 2019-12-21 DIAGNOSIS — D225 Melanocytic nevi of trunk: Secondary | ICD-10-CM | POA: Diagnosis not present

## 2019-12-21 DIAGNOSIS — Z08 Encounter for follow-up examination after completed treatment for malignant neoplasm: Secondary | ICD-10-CM | POA: Diagnosis not present

## 2019-12-21 DIAGNOSIS — Z85820 Personal history of malignant melanoma of skin: Secondary | ICD-10-CM | POA: Diagnosis not present

## 2019-12-21 DIAGNOSIS — L821 Other seborrheic keratosis: Secondary | ICD-10-CM | POA: Diagnosis not present

## 2019-12-21 DIAGNOSIS — Z1283 Encounter for screening for malignant neoplasm of skin: Secondary | ICD-10-CM | POA: Diagnosis not present

## 2019-12-21 NOTE — Telephone Encounter (Signed)
I don't like declining but I am not taking new patients at this point.  I would see if others in the clinic can accommodate the patient.  Thanks.

## 2019-12-21 NOTE — Telephone Encounter (Signed)
Attempted to call patient and advise that Dr.Duncan is not accepting new patients. LVM stating Jake Collins cannot see him and notified patient of PCP's accepting at office. LVM to call back if he would like to schedule with one of the other providers.

## 2019-12-29 DIAGNOSIS — K4090 Unilateral inguinal hernia, without obstruction or gangrene, not specified as recurrent: Secondary | ICD-10-CM | POA: Diagnosis not present

## 2020-01-25 ENCOUNTER — Telehealth: Payer: Self-pay | Admitting: Interventional Cardiology

## 2020-01-25 NOTE — Telephone Encounter (Signed)
Patient is looking for a new PCP, because his current PCP has retired.  He wants to know if Dr. Tamala Julian can recommend anyone to him.

## 2020-01-25 NOTE — Telephone Encounter (Signed)
Advised anyone with [facility] or Dr. Felipa Eth and Dr. Delfina Redwood with [facility] are all great.  Provided [facility] number to call to see who is accepting new pts.

## 2020-02-02 DIAGNOSIS — K4090 Unilateral inguinal hernia, without obstruction or gangrene, not specified as recurrent: Secondary | ICD-10-CM | POA: Diagnosis not present

## 2020-02-10 ENCOUNTER — Ambulatory Visit (INDEPENDENT_AMBULATORY_CARE_PROVIDER_SITE_OTHER): Payer: PPO | Admitting: Psychiatry

## 2020-02-10 ENCOUNTER — Encounter: Payer: Self-pay | Admitting: Psychiatry

## 2020-02-10 ENCOUNTER — Other Ambulatory Visit: Payer: Self-pay

## 2020-02-10 DIAGNOSIS — F411 Generalized anxiety disorder: Secondary | ICD-10-CM | POA: Diagnosis not present

## 2020-02-10 DIAGNOSIS — F3342 Major depressive disorder, recurrent, in full remission: Secondary | ICD-10-CM

## 2020-02-10 MED ORDER — LITHIUM CARBONATE 150 MG PO CAPS: 150.0000 mg | ORAL_CAPSULE | Freq: Every evening | ORAL | 3 refills | Status: DC

## 2020-02-10 NOTE — Progress Notes (Signed)
Jake Collins [id number] July 01, 1945 74 y.o.  Subjective:   Patient ID:  Jake Collins is a 74 y.o. (DOB 10-Sep-1945) male.  Chief Complaint:  Chief Complaint  Patient presents with  . Follow-up    Medication Management  . Depression    Medication Management    HPI OSWELL SAY presents to the office today for follow-up of major depression and GAD.  Last seen June 2020 and was doing well and no meds were changed.  The only psychiatric med for the last few years has been venlafaxine 50 mg daily.  12/07/2019 appointment with the following noted: Remained well.   Concerned about venlafaxine sexual SE.  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 Neither he nor wife notced change in mood anxiety since that time.   Has plenty of money and no longer interested in the money.   Wife is concerned he's moody.   Gym 3 times weekly. Plan: Cont med long term DT high relapse risk.  Has a long history of depression and anxiety.  Has had multiple relapses before.  Been under my care since 2006.  He's reduced venlafaxine to 25 mg daily and still complains of sexual SE but wife complains he's moody. Option lamotrigine, lithium alone, Viibryd. Lithium 150 mg daily for mood and wean off venlafaxine  By 12.5 mg daily for 3 weeks and stop it DT sexual SE.  02/10/20 appt with the following noted: No problems off venlafaxine.  No depression.  Sexual SE a little better.  Moodiness is better. Started melatonin and helped sleep.  No SE lithium. Not more anxious.    Patient denies any recent difficulty with anxiety.  Patient denies difficulty with sleep initiation or maintenance. Denies appetite disturbance.  Patient reports that energy and motivation have been good.  Patient denies any difficulty with concentration.  Patient denies any suicidal ideation.   Past Psychiatric Medication Trials:  Lithium, Lexapro, Zoloft, Effexor XR 150, Klonopin    M history psych hosp and ECT.  F neglected him.  Review of Systems:  Review of Systems  Cardiovascular: Negative for chest pain.  Genitourinary:       ED  Neurological: Negative for tremors and weakness.  Psychiatric/Behavioral: Positive for depression.    Medications: I have reviewed the patient's current medications.  Current Outpatient Medications  Medication Sig Dispense Refill  . cholecalciferol (VITAMIN D3) 25 MCG (1000 UT) tablet Take 1,000 Units by mouth daily.    . Cyanocobalamin (B-12) 5000 MCG CAPS Take 5,000 mcg by mouth daily.    . dorzolamide (TRUSOPT) 2 % ophthalmic solution Place 1 drop into the left eye 2 (two) times daily.    Marland Kitchen EPINEPHrine 0.3 mg/0.3 mL IJ SOAJ injection Inject 0.3 mg into the muscle as needed for anaphylaxis.    . famotidine (PEPCID) 20 MG tablet Take 20 mg by mouth daily.    Marland Kitchen glucosamine-chondroitin 500-400 MG tablet Take 1 tablet by mouth daily.    . hydrochlorothiazide (MICROZIDE) 12.5 MG capsule Take 12.5 mg by mouth daily.    Marland Kitchen HYDROcodone-acetaminophen (NORCO/VICODIN) 5-325 MG tablet Take 1-2 tablets by mouth every 6 (six) hours as needed for moderate pain (pain score 4-6). 56 tablet 0  . latanoprost (XALATAN) 0.005 % ophthalmic solution Place 1 drop into both eyes at bedtime.    Marland Kitchen levothyroxine (SYNTHROID, LEVOTHROID) 100 MCG tablet Take 100 mcg by mouth daily.      Marland Kitchen lithium  carbonate 150 MG capsule Take 1 capsule (150 mg total) by mouth at bedtime. 90 capsule 3  . nebivolol (BYSTOLIC) 5 MG tablet Take 5 mg by mouth daily.    . Probiotic Product (PROBIOTIC PO) Take 1 tablet by mouth daily.    Marland Kitchen pyridOXINE (VITAMIN B-6) 100 MG tablet Take 100 mg by mouth daily.    Marland Kitchen REPATHA SURECLICK 400 MG/ML SOAJ Inject 1 mL into the skin every 14 (fourteen) days.     No current facility-administered medications for this visit.    Medication Side Effects: sexual SE  Allergies:  Allergies  Allergen Reactions  . Sulfa Antibiotics     Katherina Right syndrome     Past Medical History:  Diagnosis Date  . Arthritis   . Cancer ([place])    skin melanoma  . Diverticulitis    2 bouts in 10 years  . Hyperlipemia   . Hypertension   . Hypothyroid   . Scoliosis   . Thyroid disease     Family History  Problem Relation Age of Onset  . Heart disease Father     Social History   Socioeconomic History  . Marital status: Married    Spouse name: Not on file  . Number of children: Not on file  . Years of education: Not on file  . Highest education level: Not on file  Occupational History  . Not on file  Tobacco Use  . Smoking status: Never Smoker  . Smokeless tobacco: Never Used  Substance and Sexual Activity  . Alcohol use: No  . Drug use: No  . Sexual activity: Not Currently  Other Topics Concern  . Not on file  Social History Narrative  . Not on file   Social Determinants of Health   Financial Resource Strain:   . Difficulty of Paying Living Expenses: Not on file  Food Insecurity:   . Worried About Charity fundraiser in the Last Year: Not on file  . Ran Out of Food in the Last Year: Not on file  Transportation Needs:   . Lack of Transportation (Medical): Not on file  . Lack of Transportation (Non-Medical): Not on file  Physical Activity:   . Days of Exercise per Week: Not on file  . Minutes of Exercise per Session: Not on file  Stress:   . Feeling of Stress : Not on file  Social Connections:   . Frequency of Communication with Friends and Family: Not on file  . Frequency of Social Gatherings with Friends and Family: Not on file  . Attends Religious Services: Not on file  . Active Member of Clubs or Organizations: Not on file  . Attends Archivist Meetings: Not on file  . Marital Status: Not on file  Intimate Partner Violence:   . Fear of Current or Ex-Partner: Not on file  . Emotionally Abused: Not on file  . Physically Abused: Not on file  . Sexually Abused: Not on file    Past Medical  History, Surgical history, Social history, and Family history were reviewed and updated as appropriate.   Please see review of systems for further details on the patient's review from today.   Objective:   Physical Exam:  There were no vitals taken for this visit.  Physical Exam Constitutional:      General: He is not in acute distress.    Appearance: He is well-developed.  Musculoskeletal:        General: No deformity.  Neurological:  Mental Status: He is alert and oriented to person, place, and time.     Coordination: Coordination normal.  Psychiatric:        Attention and Perception: Attention and perception normal. He does not perceive auditory or visual hallucinations.        Mood and Affect: Mood normal. Mood is not anxious or depressed. Affect is not labile, blunt, angry or inappropriate.        Speech: Speech normal.        Behavior: Behavior normal.        Thought Content: Thought content normal. Thought content does not include homicidal or suicidal ideation. Thought content does not include homicidal or suicidal plan.        Cognition and Memory: Cognition and memory normal.        Judgment: Judgment normal.     Comments: Insight intact. No delusions.       Lab Review:     Component Value Date/Time   NA 137 04/15/2019 0308   K 4.1 04/15/2019 0308   CL 104 04/15/2019 0308   CO2 22 04/15/2019 0308   GLUCOSE 158 (H) 04/15/2019 0308   BUN 17 04/15/2019 0308   CREATININE 1.15 04/15/2019 0308   CALCIUM 8.6 (L) 04/15/2019 0308   PROT 7.0 04/06/2019 1224   ALBUMIN 4.2 04/06/2019 1224   AST 26 04/06/2019 1224   ALT 24 04/06/2019 1224   ALKPHOS 62 04/06/2019 1224   BILITOT 0.5 04/06/2019 1224   GFRNONAA >60 04/15/2019 0308   GFRAA >60 04/15/2019 0308       Component Value Date/Time   WBC 13.3 (H) 04/15/2019 0308   RBC 4.73 04/15/2019 0308   HGB 14.6 04/15/2019 0308   HCT 44.2 04/15/2019 0308   PLT 202 04/15/2019 0308   MCV 93.4 04/15/2019 0308   MCH  30.9 04/15/2019 0308   MCHC 33.0 04/15/2019 0308   RDW 12.4 04/15/2019 0308   LYMPHSABS 1.5 04/06/2019 1224   MONOABS 0.7 04/06/2019 1224   EOSABS 0.4 04/06/2019 1224   BASOSABS 0.1 04/06/2019 1224    No results found for: POCLITH, LITHIUM   No results found for: PHENYTOIN, PHENOBARB, VALPROATE, CBMZ   .res Assessment: Plan:    Ola was seen today for follow-up and depression.  Diagnoses and all orders for this visit:  Depression, major, recurrent, in complete remission ([place]) -     lithium carbonate 150 MG capsule; Take 1 capsule (150 mg total) by mouth at bedtime.  Generalized anxiety disorder -     lithium carbonate 150 MG capsule; Take 1 capsule (150 mg total) by mouth at bedtime.   Cont med long term DT high relapse risk.  Has a long history of depression and anxiety.  Has had multiple relapses before.  Been under my care since 2006.   He's reduced venlafaxine to 25 mg daily and still complains of sexual SE but wife complains he's moody.  Option lamotrigine, lithium alone, Viibryd. Lithium 150 mg daily for mood and wean off venlafaxine  By 12.5 mg daily for 3 weeks and stop it DT sexual SE.  Follow-up 2 mos  Stephanie Acre, MD, DFAPA  Please see After Visit Summary for patient specific instructions.  No future appointments.  No orders of the defined types were placed in this encounter.   -------------------------------

## 2020-02-16 ENCOUNTER — Other Ambulatory Visit: Payer: Self-pay | Admitting: Surgery

## 2020-03-14 DIAGNOSIS — H401123 Primary open-angle glaucoma, left eye, severe stage: Secondary | ICD-10-CM | POA: Diagnosis not present

## 2020-03-14 DIAGNOSIS — H401111 Primary open-angle glaucoma, right eye, mild stage: Secondary | ICD-10-CM | POA: Diagnosis not present

## 2020-04-04 DIAGNOSIS — D176 Benign lipomatous neoplasm of spermatic cord: Secondary | ICD-10-CM | POA: Diagnosis not present

## 2020-04-04 DIAGNOSIS — G8918 Other acute postprocedural pain: Secondary | ICD-10-CM | POA: Diagnosis not present

## 2020-04-04 DIAGNOSIS — K4090 Unilateral inguinal hernia, without obstruction or gangrene, not specified as recurrent: Secondary | ICD-10-CM | POA: Diagnosis not present

## 2020-04-14 DIAGNOSIS — K219 Gastro-esophageal reflux disease without esophagitis: Secondary | ICD-10-CM | POA: Diagnosis not present

## 2020-04-14 DIAGNOSIS — E7800 Pure hypercholesterolemia, unspecified: Secondary | ICD-10-CM | POA: Diagnosis not present

## 2020-04-14 DIAGNOSIS — E039 Hypothyroidism, unspecified: Secondary | ICD-10-CM | POA: Diagnosis not present

## 2020-04-14 DIAGNOSIS — I10 Essential (primary) hypertension: Secondary | ICD-10-CM | POA: Diagnosis not present

## 2020-06-20 DIAGNOSIS — M166 Other bilateral secondary osteoarthritis of hip: Secondary | ICD-10-CM | POA: Diagnosis not present

## 2020-06-20 DIAGNOSIS — M419 Scoliosis, unspecified: Secondary | ICD-10-CM | POA: Diagnosis not present

## 2020-06-20 DIAGNOSIS — M545 Low back pain, unspecified: Secondary | ICD-10-CM | POA: Diagnosis not present

## 2020-06-20 DIAGNOSIS — Z6825 Body mass index (BMI) 25.0-25.9, adult: Secondary | ICD-10-CM | POA: Diagnosis not present

## 2020-06-23 DIAGNOSIS — H903 Sensorineural hearing loss, bilateral: Secondary | ICD-10-CM | POA: Diagnosis not present

## 2020-07-05 DIAGNOSIS — M545 Low back pain, unspecified: Secondary | ICD-10-CM | POA: Diagnosis not present

## 2020-07-10 DIAGNOSIS — M19011 Primary osteoarthritis, right shoulder: Secondary | ICD-10-CM | POA: Diagnosis not present

## 2020-08-22 DIAGNOSIS — M545 Low back pain, unspecified: Secondary | ICD-10-CM | POA: Diagnosis not present

## 2020-08-22 DIAGNOSIS — G8929 Other chronic pain: Secondary | ICD-10-CM | POA: Diagnosis not present

## 2020-08-22 DIAGNOSIS — Z6825 Body mass index (BMI) 25.0-25.9, adult: Secondary | ICD-10-CM | POA: Diagnosis not present

## 2020-08-22 DIAGNOSIS — M419 Scoliosis, unspecified: Secondary | ICD-10-CM | POA: Diagnosis not present

## 2020-09-13 DIAGNOSIS — H401123 Primary open-angle glaucoma, left eye, severe stage: Secondary | ICD-10-CM | POA: Diagnosis not present

## 2020-09-13 DIAGNOSIS — H524 Presbyopia: Secondary | ICD-10-CM | POA: Diagnosis not present

## 2020-09-13 DIAGNOSIS — H401111 Primary open-angle glaucoma, right eye, mild stage: Secondary | ICD-10-CM | POA: Diagnosis not present

## 2020-09-13 DIAGNOSIS — D485 Neoplasm of uncertain behavior of skin: Secondary | ICD-10-CM | POA: Diagnosis not present

## 2020-09-25 ENCOUNTER — Other Ambulatory Visit: Payer: Self-pay | Admitting: Ophthalmology

## 2020-09-25 DIAGNOSIS — D485 Neoplasm of uncertain behavior of skin: Secondary | ICD-10-CM | POA: Diagnosis not present

## 2020-09-25 DIAGNOSIS — B078 Other viral warts: Secondary | ICD-10-CM | POA: Diagnosis not present

## 2020-10-12 DIAGNOSIS — I10 Essential (primary) hypertension: Secondary | ICD-10-CM | POA: Diagnosis not present

## 2020-10-12 DIAGNOSIS — Z0000 Encounter for general adult medical examination without abnormal findings: Secondary | ICD-10-CM | POA: Diagnosis not present

## 2020-10-12 DIAGNOSIS — E039 Hypothyroidism, unspecified: Secondary | ICD-10-CM | POA: Diagnosis not present

## 2020-10-12 DIAGNOSIS — K219 Gastro-esophageal reflux disease without esophagitis: Secondary | ICD-10-CM | POA: Diagnosis not present

## 2020-10-12 DIAGNOSIS — E7800 Pure hypercholesterolemia, unspecified: Secondary | ICD-10-CM | POA: Diagnosis not present

## 2020-12-08 ENCOUNTER — Other Ambulatory Visit: Payer: Self-pay | Admitting: Psychiatry

## 2020-12-08 DIAGNOSIS — F3342 Major depressive disorder, recurrent, in full remission: Secondary | ICD-10-CM

## 2020-12-08 DIAGNOSIS — F411 Generalized anxiety disorder: Secondary | ICD-10-CM

## 2020-12-20 DIAGNOSIS — Z1283 Encounter for screening for malignant neoplasm of skin: Secondary | ICD-10-CM | POA: Diagnosis not present

## 2020-12-20 DIAGNOSIS — Z08 Encounter for follow-up examination after completed treatment for malignant neoplasm: Secondary | ICD-10-CM | POA: Diagnosis not present

## 2020-12-20 DIAGNOSIS — Z85820 Personal history of malignant melanoma of skin: Secondary | ICD-10-CM | POA: Diagnosis not present

## 2021-01-24 DIAGNOSIS — H401123 Primary open-angle glaucoma, left eye, severe stage: Secondary | ICD-10-CM | POA: Diagnosis not present

## 2021-01-24 DIAGNOSIS — H401111 Primary open-angle glaucoma, right eye, mild stage: Secondary | ICD-10-CM | POA: Diagnosis not present

## 2021-02-08 ENCOUNTER — Ambulatory Visit: Payer: PPO | Admitting: Psychiatry

## 2021-02-09 ENCOUNTER — Other Ambulatory Visit: Payer: Self-pay

## 2021-02-09 ENCOUNTER — Encounter: Payer: Self-pay | Admitting: Psychiatry

## 2021-02-09 ENCOUNTER — Ambulatory Visit (INDEPENDENT_AMBULATORY_CARE_PROVIDER_SITE_OTHER): Payer: PPO | Admitting: Psychiatry

## 2021-02-09 DIAGNOSIS — F3342 Major depressive disorder, recurrent, in full remission: Secondary | ICD-10-CM | POA: Diagnosis not present

## 2021-02-09 DIAGNOSIS — F411 Generalized anxiety disorder: Secondary | ICD-10-CM | POA: Diagnosis not present

## 2021-02-09 NOTE — Progress Notes (Signed)
MADIX GATHRIGHT [id number] April 09, 1946 75 y.o.  Subjective:   Patient ID:  Jake Collins is a 75 y.o. (DOB 08-23-1945) male.  Chief Complaint:  Chief Complaint  Patient presents with   Follow-up     Depression       Jake Collins presents to the office today for follow-up of major depression and GAD.  seen June 2020 and was doing well and no meds were changed.  The only psychiatric med for the last few years has been venlafaxine 50 mg daily.  12/07/2019 appointment with the following noted: Remained well.   Concerned about venlafaxine sexual SE.  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 Neither he nor wife notced change in mood anxiety since that time.   Has plenty of money and no longer interested in the money.   Wife is concerned he's moody.   Gym 3 times weekly. Plan: Cont med long term DT high relapse risk.  Has a long history of depression and anxiety.  Has had multiple relapses before.  Been under my care since 2006.  He's reduced venlafaxine to 25 mg daily and still complains of sexual SE but wife complains he's moody. Option lamotrigine, lithium alone, Viibryd. Lithium 150 mg daily for mood and wean off venlafaxine  By 12.5 mg daily for 3 weeks and stop it DT sexual SE.  02/10/20 appt with the following noted: No problems off venlafaxine.  No depression.  Sexual SE a little better.  Moodiness is better. Started melatonin and helped sleep.  No SE lithium. Not more anxious.    02/09/21 appt with following noted:  No depression.   Patient denies any recent difficulty with anxiety.  Patient denies difficulty with sleep initiation or maintenance. Denies appetite disturbance.  Patient reports that energy and motivation have been good.  Patient denies any difficulty with concentration.  Patient denies any suicidal ideation.   Past Psychiatric Medication Trials:  Lithium, Lexapro, Zoloft, Effexor XR 150, Klonopin    M history psych hosp and ECT.  F neglected him.  Review of Systems:  Review of Systems  Cardiovascular:  Negative for chest pain and palpitations.  Genitourinary:        ED  Neurological:  Negative for tremors and weakness.  Psychiatric/Behavioral:  Positive for depression.    Medications: I have reviewed the patient's current medications.  Current Outpatient Medications  Medication Sig Dispense Refill   cholecalciferol (VITAMIN D3) 25 MCG (1000 UT) tablet Take 1,000 Units by mouth daily.     Cyanocobalamin (B-12) 5000 MCG CAPS Take 5,000 mcg by mouth daily.     dorzolamide (TRUSOPT) 2 % ophthalmic solution Place 1 drop into the left eye 2 (two) times daily.     EPINEPHrine 0.3 mg/0.3 mL IJ SOAJ injection Inject 0.3 mg into the muscle as needed for anaphylaxis.     famotidine (PEPCID) 20 MG tablet Take 20 mg by mouth daily.     glucosamine-chondroitin 500-400 MG tablet Take 1 tablet by mouth daily.     hydrochlorothiazide (MICROZIDE) 12.5 MG capsule Take 12.5 mg by mouth daily.     HYDROcodone-acetaminophen (NORCO/VICODIN) 5-325 MG tablet Take 1-2 tablets by mouth every 6 (six) hours as needed for moderate pain (pain score 4-6). 56 tablet 0   latanoprost (XALATAN) 0.005 % ophthalmic solution Place 1 drop into both eyes at bedtime.     levothyroxine (SYNTHROID, LEVOTHROID) 100 MCG tablet Take 100 mcg by  mouth daily.       lithium carbonate 150 MG capsule TAKE 1 CAPSULE (150 MG TOTAL) BY MOUTH AT BEDTIME. 90 capsule 3   nebivolol (BYSTOLIC) 5 MG tablet Take 5 mg by mouth daily.     Probiotic Product (PROBIOTIC PO) Take 1 tablet by mouth daily.     pyridOXINE (VITAMIN B-6) 100 MG tablet Take 100 mg by mouth daily.     REPATHA SURECLICK [id number] MG/ML SOAJ Inject 1 mL into the skin every 14 (fourteen) days.     No current facility-administered medications for this visit.    Medication Side Effects: sexual SE  Allergies:  Allergies  Allergen Reactions   Sulfa Antibiotics     Katherina Right syndrome     Past Medical History:  Diagnosis Date   Arthritis    Cancer ([place])    skin melanoma   Diverticulitis    2 bouts in 10 years   Hyperlipemia    Hypertension    Hypothyroid    Scoliosis    Thyroid disease     Family History  Problem Relation Age of Onset   Heart disease Father     Social History   Socioeconomic History   Marital status: Married    Spouse name: Not on file   Number of children: Not on file   Years of education: Not on file   Highest education level: Not on file  Occupational History   Not on file  Tobacco Use   Smoking status: Never   Smokeless tobacco: Never  Substance and Sexual Activity   Alcohol use: No   Drug use: No   Sexual activity: Not Currently  Other Topics Concern   Not on file  Social History Narrative   Not on file   Social Determinants of Health   Financial Resource Strain: Not on file  Food Insecurity: Not on file  Transportation Needs: Not on file  Physical Activity: Not on file  Stress: Not on file  Social Connections: Not on file  Intimate Partner Violence: Not on file    Past Medical History, Surgical history, Social history, and Family history were reviewed and updated as appropriate.   Please see review of systems for further details on the patient's review from today.   Objective:   Physical Exam:  There were no vitals taken for this visit.  Physical Exam Constitutional:      General: He is not in acute distress.    Appearance: He is well-developed.  Musculoskeletal:        General: No deformity.  Neurological:     Mental Status: He is alert and oriented to person, place, and time.     Coordination: Coordination normal.  Psychiatric:        Attention and Perception: Attention and perception normal. He does not perceive auditory or visual hallucinations.        Mood and Affect: Mood normal. Mood is not anxious or depressed. Affect is not labile, blunt, angry, tearful or inappropriate.         Speech: Speech normal.        Behavior: Behavior normal.        Thought Content: Thought content normal. Thought content does not include homicidal or suicidal ideation. Thought content does not include homicidal or suicidal plan.        Cognition and Memory: Cognition and memory normal.        Judgment: Judgment normal.     Comments: Insight intact. No delusions.  Lab Review:     Component Value Date/Time   NA 137 04/15/2019 0308   K 4.1 04/15/2019 0308   CL 104 04/15/2019 0308   CO2 22 04/15/2019 0308   GLUCOSE 158 (H) 04/15/2019 0308   BUN 17 04/15/2019 0308   CREATININE 1.15 04/15/2019 0308   CALCIUM 8.6 (L) 04/15/2019 0308   PROT 7.0 04/06/2019 1224   ALBUMIN 4.2 04/06/2019 1224   AST 26 04/06/2019 1224   ALT 24 04/06/2019 1224   ALKPHOS 62 04/06/2019 1224   BILITOT 0.5 04/06/2019 1224   GFRNONAA >60 04/15/2019 0308   GFRAA >60 04/15/2019 0308       Component Value Date/Time   WBC 13.3 (H) 04/15/2019 0308   RBC 4.73 04/15/2019 0308   HGB 14.6 04/15/2019 0308   HCT 44.2 04/15/2019 0308   PLT 202 04/15/2019 0308   MCV 93.4 04/15/2019 0308   MCH 30.9 04/15/2019 0308   MCHC 33.0 04/15/2019 0308   RDW 12.4 04/15/2019 0308   LYMPHSABS 1.5 04/06/2019 1224   MONOABS 0.7 04/06/2019 1224   EOSABS 0.4 04/06/2019 1224   BASOSABS 0.1 04/06/2019 1224    No results found for: POCLITH, LITHIUM   No results found for: PHENYTOIN, PHENOBARB, VALPROATE, CBMZ   .res Assessment: Plan:    Demondre was seen today for follow-up.  Diagnoses and all orders for this visit:  Depression, major, recurrent, in complete remission ([place])  Generalized anxiety disorder  Cont med long term DT high relapse risk.  Has a long history of depression and anxiety.  Has had multiple relapses before.  Been under my care since 2006.   Option lamotrigine, lithium alone, Viibryd. Lithium 150 mg daily for mood and no problems off of the venlafaxine.. No SE  Follow-up 12 mos  Stephanie Acre, MD, DFAPA  Please see After Visit Summary for patient specific instructions.  No future appointments.   No orders of the defined types were placed in this encounter.   -------------------------------

## 2021-04-11 ENCOUNTER — Other Ambulatory Visit: Payer: Self-pay | Admitting: Psychiatry

## 2021-04-11 DIAGNOSIS — F3342 Major depressive disorder, recurrent, in full remission: Secondary | ICD-10-CM

## 2021-04-11 DIAGNOSIS — F411 Generalized anxiety disorder: Secondary | ICD-10-CM

## 2021-04-24 DIAGNOSIS — I10 Essential (primary) hypertension: Secondary | ICD-10-CM | POA: Diagnosis not present

## 2021-04-24 DIAGNOSIS — K219 Gastro-esophageal reflux disease without esophagitis: Secondary | ICD-10-CM | POA: Diagnosis not present

## 2021-04-24 DIAGNOSIS — E7800 Pure hypercholesterolemia, unspecified: Secondary | ICD-10-CM | POA: Diagnosis not present

## 2021-04-24 DIAGNOSIS — E039 Hypothyroidism, unspecified: Secondary | ICD-10-CM | POA: Diagnosis not present

## 2021-04-24 DIAGNOSIS — Z23 Encounter for immunization: Secondary | ICD-10-CM | POA: Diagnosis not present

## 2021-05-23 DIAGNOSIS — H401123 Primary open-angle glaucoma, left eye, severe stage: Secondary | ICD-10-CM | POA: Diagnosis not present

## 2021-05-23 DIAGNOSIS — H401111 Primary open-angle glaucoma, right eye, mild stage: Secondary | ICD-10-CM | POA: Diagnosis not present

## 2021-06-15 DIAGNOSIS — M25521 Pain in right elbow: Secondary | ICD-10-CM | POA: Diagnosis not present

## 2021-06-15 DIAGNOSIS — M7711 Lateral epicondylitis, right elbow: Secondary | ICD-10-CM | POA: Diagnosis not present

## 2021-06-16 ENCOUNTER — Other Ambulatory Visit: Payer: Self-pay | Admitting: Psychiatry

## 2021-06-16 DIAGNOSIS — F3342 Major depressive disorder, recurrent, in full remission: Secondary | ICD-10-CM

## 2021-06-16 DIAGNOSIS — F411 Generalized anxiety disorder: Secondary | ICD-10-CM

## 2021-09-21 DIAGNOSIS — H52203 Unspecified astigmatism, bilateral: Secondary | ICD-10-CM | POA: Diagnosis not present

## 2021-09-21 DIAGNOSIS — Z961 Presence of intraocular lens: Secondary | ICD-10-CM | POA: Diagnosis not present

## 2021-09-21 DIAGNOSIS — H401111 Primary open-angle glaucoma, right eye, mild stage: Secondary | ICD-10-CM | POA: Diagnosis not present

## 2021-09-21 DIAGNOSIS — H401123 Primary open-angle glaucoma, left eye, severe stage: Secondary | ICD-10-CM | POA: Diagnosis not present

## 2021-09-27 ENCOUNTER — Telehealth: Payer: Self-pay | Admitting: Psychiatry

## 2021-09-27 NOTE — Telephone Encounter (Signed)
Pt called reporting his Lithium 150 mg daily is not working. Scheduled for 5/3. Any advise before apt call [phone]. Leave message if not answer. ?

## 2021-09-27 NOTE — Telephone Encounter (Signed)
LVM to RC 

## 2021-09-28 NOTE — Telephone Encounter (Signed)
Patient has an appt for 5/3. He said he was not in crisis and could wait to discuss with Dr. Clovis Pu at this appt. He said he had been on lithium for 50 years and does not want to increase it. He just wants to discuss options with Dr. Clovis Pu.  ?

## 2021-10-10 ENCOUNTER — Ambulatory Visit: Payer: PPO | Admitting: Psychiatry

## 2021-10-10 ENCOUNTER — Encounter: Payer: Self-pay | Admitting: Psychiatry

## 2021-10-10 DIAGNOSIS — F330 Major depressive disorder, recurrent, mild: Secondary | ICD-10-CM | POA: Diagnosis not present

## 2021-10-10 DIAGNOSIS — F411 Generalized anxiety disorder: Secondary | ICD-10-CM | POA: Diagnosis not present

## 2021-10-10 MED ORDER — VILAZODONE HCL 10 MG PO TABS: 10.0000 mg | ORAL_TABLET | Freq: Every day | ORAL | 1 refills | Status: DC

## 2021-10-10 NOTE — Progress Notes (Signed)
Jake Collins ?[id number] ?May 20, 1946 ?76 y.o. ? ?Subjective:  ? ?Patient ID:  Jake Collins is a 76 y.o. (DOB 19-Feb-1946) male. ? ?Chief Complaint:  ?Chief Complaint  ?Patient presents with  ? Follow-up  ? Depression  ? ? ? ?Depression ?      ?Jake Collins presents to the office today for follow-up of major depression and GAD. ? ?seen June 2020 and was doing well and no meds were changed.  The only psychiatric med for the last few years has been venlafaxine 50 mg daily. ? ?12/07/2019 appointment with the following noted: ?Remained well.   ?Concerned about venlafaxine sexual SE.  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 ?Neither he nor wife notced change in mood anxiety since that time.   ?Has plenty of money and no longer interested in the money.   ?Wife is concerned he's moody.   ?Gym 3 times weekly. ?Plan: Cont med long term DT high relapse risk.  Has a long history of depression and anxiety.  Has had multiple relapses before.  Been under my care since 2006.  ?He's reduced venlafaxine to 25 mg daily and still complains of sexual SE but wife complains he's moody. ?Option lamotrigine, lithium alone, Viibryd. ?Lithium 150 mg daily for mood and wean off venlafaxine  By 12.5 mg daily for 3 weeks and stop it DT sexual SE. ? ?02/10/20 appt with the following noted: ?No problems off venlafaxine.  No depression.  Sexual SE a little better.  Moodiness is better. ?Started melatonin and helped sleep.  No SE lithium. Not more anxious.   ? ?02/09/21 appt with following noted:  ?No depression.   ?Patient denies any recent difficulty with anxiety.  Patient denies difficulty with sleep initiation or maintenance. Denies appetite disturbance.  Patient reports that energy and motivation have been good.  Patient denies any difficulty with concentration.  Patient denies any suicidal ideation. ?Plan: Lithium 150 mg daily for mood and no problems off of the venlafaxine.. ?   ?10/10/21 appt noted: ?Wife not happy with me.  She's had 4th and 5th back surgery.  Does all kinds of things for him.  She says he's moodier than normal.  Money is not a problem. ?More short tempered than he used to be.  Can't play golf bc of knees.  Will need TKR in the next few mos.  Still goes to gym 3 times weekly.  Would like to get back to golf bc no hobbies. ? ? ?Past Psychiatric Medication Trials:  Lithium, Lexapro, Zoloft, Effexor XR 150 sexual SE, Klonopin  ? ?M history psych hosp and ECT.  F neglected him. ? ?Review of Systems:  ?Review of Systems  ?Cardiovascular:  Negative for chest pain and palpitations.  ?Genitourinary:   ?     ED  ?Musculoskeletal:  Positive for arthralgias and gait problem.  ?Neurological:  Negative for tremors and weakness.  ?Psychiatric/Behavioral:  Positive for dysphoric mood.   ? ?Medications: I have reviewed the patient's current medications. ? ?Current Outpatient Medications  ?Medication Sig Dispense Refill  ? cholecalciferol (VITAMIN D3) 25 MCG (1000 UT) tablet Take 1,000 Units by mouth daily.    ? Cyanocobalamin (B-12) 5000 MCG CAPS Take 5,000 mcg by mouth daily.    ? dorzolamide (TRUSOPT) 2 % ophthalmic solution Place 1 drop into the left eye 2 (two) times daily.    ? EPINEPHrine 0.3 mg/0.3 mL IJ SOAJ injection Inject 0.3  mg into the muscle as needed for anaphylaxis.    ? famotidine (PEPCID) 20 MG tablet Take 20 mg by mouth daily.    ? glucosamine-chondroitin 500-400 MG tablet Take 1 tablet by mouth daily.    ? hydrochlorothiazide (MICROZIDE) 12.5 MG capsule Take 12.5 mg by mouth daily.    ? HYDROcodone-acetaminophen (NORCO/VICODIN) 5-325 MG tablet Take 1-2 tablets by mouth every 6 (six) hours as needed for moderate pain (pain score 4-6). 56 tablet 0  ? latanoprost (XALATAN) 0.005 % ophthalmic solution Place 1 drop into both eyes at bedtime.    ? levothyroxine (SYNTHROID, LEVOTHROID) 100 MCG tablet Take 100 mcg by mouth daily.      ? lithium carbonate 150 MG capsule TAKE 1  CAPSULE BY MOUTH AT BEDTIME. 90 capsule 3  ? nebivolol (BYSTOLIC) 5 MG tablet Take 5 mg by mouth daily.    ? Probiotic Product (PROBIOTIC PO) Take 1 tablet by mouth daily.    ? pyridOXINE (VITAMIN B-6) 100 MG tablet Take 100 mg by mouth daily.    ? REPATHA SURECLICK 818 MG/ML SOAJ Inject 1 mL into the skin every 14 (fourteen) days.    ? Vilazodone HCl (VIIBRYD) 10 MG TABS Take 1 tablet (10 mg total) by mouth daily. 30 tablet 1  ? ?No current facility-administered medications for this visit.  ? ? ?Medication Side Effects: sexual SE ? ?Allergies:  ?Allergies  ?Allergen Reactions  ? Sulfa Antibiotics   ?  Katherina Right syndrome   ? ? ?Past Medical History:  ?Diagnosis Date  ? Arthritis   ? Cancer [facility])   ? skin melanoma  ? Diverticulitis   ? 2 bouts in 10 years  ? Hyperlipemia   ? Hypertension   ? Hypothyroid   ? Scoliosis   ? Thyroid disease   ? ? ?Family History  ?Problem Relation Age of Onset  ? Heart disease Father   ? ? ?Social History  ? ?Socioeconomic History  ? Marital status: Married  ?  Spouse name: Not on file  ? Number of children: Not on file  ? Years of education: Not on file  ? Highest education level: Not on file  ?Occupational History  ? Not on file  ?Tobacco Use  ? Smoking status: Never  ? Smokeless tobacco: Never  ?Substance and Sexual Activity  ? Alcohol use: No  ? Drug use: No  ? Sexual activity: Not Currently  ?Other Topics Concern  ? Not on file  ?Social History Narrative  ? Not on file  ? ?Social Determinants of Health  ? ?Financial Resource Strain: Not on file  ?Food Insecurity: Not on file  ?Transportation Needs: Not on file  ?Physical Activity: Not on file  ?Stress: Not on file  ?Social Connections: Not on file  ?Intimate Partner Violence: Not on file  ? ? ?Past Medical History, Surgical history, Social history, and Family history were reviewed and updated as appropriate.  ? ?Please see review of systems for further details on the patient's review from today.  ? ?Objective:  ? ?Physical  Exam:  ?There were no vitals taken for this visit. ? ?Physical Exam ?Constitutional:   ?   General: He is not in acute distress. ?   Appearance: He is well-developed.  ?Musculoskeletal:     ?   General: No deformity.  ?Neurological:  ?   Mental Status: He is alert and oriented to person, place, and time.  ?   Coordination: Coordination normal.  ?Psychiatric:     ?  Attention and Perception: Attention and perception normal. He does not perceive auditory or visual hallucinations.     ?   Mood and Affect: Mood is depressed. Mood is not anxious. Affect is not labile, blunt, angry, tearful or inappropriate.     ?   Speech: Speech normal.     ?   Behavior: Behavior normal.     ?   Thought Content: Thought content normal. Thought content is not delusional. Thought content does not include homicidal or suicidal ideation. Thought content does not include suicidal plan.     ?   Cognition and Memory: Cognition and memory normal.     ?   Judgment: Judgment normal.  ?   Comments: Insight intact. ?No delusions.  ?More irritable. ?  ? ? ?Lab Review:  ?   ?Component Value Date/Time  ? NA 137 04/15/2019 0308  ? K 4.1 04/15/2019 0308  ? CL 104 04/15/2019 0308  ? CO2 22 04/15/2019 0308  ? GLUCOSE 158 (H) 04/15/2019 0308  ? BUN 17 04/15/2019 0308  ? CREATININE 1.15 04/15/2019 0308  ? CALCIUM 8.6 (L) 04/15/2019 0308  ? PROT 7.0 04/06/2019 1224  ? ALBUMIN 4.2 04/06/2019 1224  ? AST 26 04/06/2019 1224  ? ALT 24 04/06/2019 1224  ? ALKPHOS 62 04/06/2019 1224  ? BILITOT 0.5 04/06/2019 1224  ? GFRNONAA >60 04/15/2019 0308  ? GFRAA >60 04/15/2019 0308  ? ? ?   ?Component Value Date/Time  ? WBC 13.3 (H) 04/15/2019 0308  ? RBC 4.73 04/15/2019 0308  ? HGB 14.6 04/15/2019 0308  ? HCT 44.2 04/15/2019 0308  ? PLT 202 04/15/2019 0308  ? MCV 93.4 04/15/2019 0308  ? MCH 30.9 04/15/2019 0308  ? MCHC 33.0 04/15/2019 0308  ? RDW 12.4 04/15/2019 0308  ? LYMPHSABS 1.5 04/06/2019 1224  ? MONOABS 0.7 04/06/2019 1224  ? EOSABS 0.4 04/06/2019 1224  ? BASOSABS  0.1 04/06/2019 1224  ? ? ?No results found for: POCLITH, LITHIUM  ? ?No results found for: PHENYTOIN, PHENOBARB, VALPROATE, CBMZ  ? ?.res ?Assessment: Plan:   ? ?Jake Collins was seen today for follow-up and de

## 2021-10-12 DIAGNOSIS — M170 Bilateral primary osteoarthritis of knee: Secondary | ICD-10-CM | POA: Diagnosis not present

## 2021-10-12 DIAGNOSIS — M1711 Unilateral primary osteoarthritis, right knee: Secondary | ICD-10-CM | POA: Diagnosis not present

## 2021-10-12 DIAGNOSIS — M1712 Unilateral primary osteoarthritis, left knee: Secondary | ICD-10-CM | POA: Diagnosis not present

## 2021-10-24 DIAGNOSIS — E039 Hypothyroidism, unspecified: Secondary | ICD-10-CM | POA: Diagnosis not present

## 2021-10-24 DIAGNOSIS — E7800 Pure hypercholesterolemia, unspecified: Secondary | ICD-10-CM | POA: Diagnosis not present

## 2021-10-24 DIAGNOSIS — K219 Gastro-esophageal reflux disease without esophagitis: Secondary | ICD-10-CM | POA: Diagnosis not present

## 2021-10-24 DIAGNOSIS — Z0000 Encounter for general adult medical examination without abnormal findings: Secondary | ICD-10-CM | POA: Diagnosis not present

## 2021-10-24 DIAGNOSIS — I10 Essential (primary) hypertension: Secondary | ICD-10-CM | POA: Diagnosis not present

## 2021-10-26 ENCOUNTER — Telehealth: Payer: Self-pay | Admitting: Psychiatry

## 2021-10-26 DIAGNOSIS — F411 Generalized anxiety disorder: Secondary | ICD-10-CM

## 2021-10-26 DIAGNOSIS — F330 Major depressive disorder, recurrent, mild: Secondary | ICD-10-CM

## 2021-10-26 NOTE — Telephone Encounter (Signed)
Pt called at 10:40 am asking for a new script of his viibryd 10 mg to be a 90 day supply . It is cheaper for him and he needs it sent to a different pharmacy. [facility] located at [address] [place]. He picked up the one in may but he wants the next month

## 2021-10-30 MED ORDER — VILAZODONE HCL 10 MG PO TABS: 10.0000 mg | ORAL_TABLET | Freq: Every day | ORAL | 0 refills | Status: DC

## 2021-10-30 NOTE — Telephone Encounter (Signed)
Called patient to tell him insurance only covered 30-day supply. He said he is paying OOP. Rx sent as requested.

## 2021-11-29 ENCOUNTER — Ambulatory Visit (INDEPENDENT_AMBULATORY_CARE_PROVIDER_SITE_OTHER): Payer: PPO | Admitting: Psychiatry

## 2021-11-29 ENCOUNTER — Encounter: Payer: Self-pay | Admitting: Psychiatry

## 2021-11-29 DIAGNOSIS — F331 Major depressive disorder, recurrent, moderate: Secondary | ICD-10-CM

## 2021-11-29 DIAGNOSIS — F411 Generalized anxiety disorder: Secondary | ICD-10-CM

## 2021-11-29 NOTE — Progress Notes (Signed)
[name] [id number] 09-03-1945 76 y.o.  Subjective:   Patient ID:  Jake Collins is a 76 y.o. (DOB Aug 13, 1945) male.  Chief Complaint:  Chief Complaint  Patient presents with   Follow-up   Depression   Anxiety     Depression        Past medical history includes anxiety.   Anxiety Patient reports no chest pain or palpitations.     Jake Collins presents to the office today for follow-up of major depression and GAD.  seen June 2020 and was doing well and no meds were changed.  The only psychiatric med for the last few years has been venlafaxine 50 mg daily.  12/07/2019 appointment with the following noted: Remained well.   Concerned about venlafaxine sexual SE.  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 Neither he nor wife notced change in mood anxiety since that time.   Has plenty of money and no longer interested in the money.   Wife is concerned he's moody.   Gym 3 times weekly. Plan: Cont med long term DT high relapse risk.  Has a long history of depression and anxiety.  Has had multiple relapses before.  Been under my care since 2006.  He's reduced venlafaxine to 25 mg daily and still complains of sexual SE but wife complains he's moody. Option lamotrigine, lithium alone, Viibryd. Lithium 150 mg daily for mood and wean off venlafaxine  By 12.5 mg daily for 3 weeks and stop it DT sexual SE.  02/10/20 appt with the following noted: No problems off venlafaxine.  No depression.  Sexual SE a little better.  Moodiness is better. Started melatonin and helped sleep.  No SE lithium. Not more anxious.    02/09/21 appt with following noted:  No depression.   Patient denies any recent difficulty with anxiety.  Patient denies difficulty with sleep initiation or maintenance. Denies appetite disturbance.  Patient reports that energy and motivation have been good.  Patient denies any difficulty with concentration.   Patient denies any suicidal ideation. Plan: Lithium 150 mg daily for mood and no problems off of the venlafaxine..   10/10/21 appt noted: Wife not happy with me.  She's had 4th and 5th back surgery.  Does all kinds of things for him.  She says he's moodier than normal.  Money is not a problem. More short tempered than he used to be.  Can't play golf bc of knees.  Will need TKR in the next few mos.  Still goes to gym 3 times weekly.  Would like to get back to golf bc no hobbies. PLAN: To avoid sexual SE will pick Trintellix or Viibryd.   Viibryd 10 mg daily. Lithium 150 mg daily for mood and no problems off of the venlafaxine..  11/29/21 appt noted: No effect Viibryd good or bad.  Takes with breakfast. No SE Would like more benefit.   Sleep good.  Lower appetite but wt ok.  Past Psychiatric Medication Trials:  Lithium, Lexapro, Zoloft, Effexor XR 150 sexual SE, Klonopin   M history psych hosp and ECT.  F neglected him.  Review of Systems:  Review of Systems  Cardiovascular:  Negative for chest pain and palpitations.  Gastrointestinal:  Positive for constipation. Negative for diarrhea.  Genitourinary:        ED  Musculoskeletal:  Positive for arthralgias and gait problem.  Neurological:  Negative for tremors.  Psychiatric/Behavioral:  Positive  for dysphoric mood.     Medications: I have reviewed the patient's current medications.  Current Outpatient Medications  Medication Sig Dispense Refill   cholecalciferol (VITAMIN D3) 25 MCG (1000 UT) tablet Take 1,000 Units by mouth daily.     Cyanocobalamin (B-12) 5000 MCG CAPS Take 5,000 mcg by mouth daily.     dorzolamide (TRUSOPT) 2 % ophthalmic solution Place 1 drop into the left eye 2 (two) times daily.     EPINEPHrine 0.3 mg/0.3 mL IJ SOAJ injection Inject 0.3 mg into the muscle as needed for anaphylaxis.     famotidine (PEPCID) 20 MG tablet Take 20 mg by mouth daily.     glucosamine-chondroitin 500-400 MG tablet Take 1 tablet by mouth  daily.     hydrochlorothiazide (MICROZIDE) 12.5 MG capsule Take 12.5 mg by mouth daily.     HYDROcodone-acetaminophen (NORCO/VICODIN) 5-325 MG tablet Take 1-2 tablets by mouth every 6 (six) hours as needed for moderate pain (pain score 4-6). 56 tablet 0   latanoprost (XALATAN) 0.005 % ophthalmic solution Place 1 drop into both eyes at bedtime.     levothyroxine (SYNTHROID, LEVOTHROID) 100 MCG tablet Take 100 mcg by mouth daily.       lithium carbonate 150 MG capsule TAKE 1 CAPSULE BY MOUTH AT BEDTIME. 90 capsule 3   nebivolol (BYSTOLIC) 5 MG tablet Take 5 mg by mouth daily.     Probiotic Product (PROBIOTIC PO) Take 1 tablet by mouth daily.     pyridOXINE (VITAMIN B-6) 100 MG tablet Take 100 mg by mouth daily.     REPATHA SURECLICK 222 MG/ML SOAJ Inject 1 mL into the skin every 14 (fourteen) days.     Vilazodone HCl (VIIBRYD) 10 MG TABS Take 1 tablet (10 mg total) by mouth daily. 90 tablet 0   No current facility-administered medications for this visit.    Medication Side Effects: sexual SE  Allergies:  Allergies  Allergen Reactions   Sulfa Antibiotics     Katherina Right syndrome     Past Medical History:  Diagnosis Date   Arthritis    Cancer ([place])    skin melanoma   Diverticulitis    2 bouts in 10 years   Hyperlipemia    Hypertension    Hypothyroid    Scoliosis    Thyroid disease     Family History  Problem Relation Age of Onset   Heart disease Father     Social History   Socioeconomic History   Marital status: Married    Spouse name: Not on file   Number of children: Not on file   Years of education: Not on file   Highest education level: Not on file  Occupational History   Not on file  Tobacco Use   Smoking status: Never   Smokeless tobacco: Never  Substance and Sexual Activity   Alcohol use: No   Drug use: No   Sexual activity: Not Currently  Other Topics Concern   Not on file  Social History Narrative   Not on file   Social Determinants of Health    Financial Resource Strain: Not on file  Food Insecurity: Not on file  Transportation Needs: Not on file  Physical Activity: Not on file  Stress: Not on file  Social Connections: Not on file  Intimate Partner Violence: Not on file    Past Medical History, Surgical history, Social history, and Family history were reviewed and updated as appropriate.   Please see review of systems for further  details on the patient's review from today.   Objective:   Physical Exam:  There were no vitals taken for this visit.  Physical Exam Constitutional:      General: He is not in acute distress.    Appearance: He is well-developed.  Musculoskeletal:        General: No deformity.  Neurological:     Mental Status: He is alert and oriented to person, place, and time.     Coordination: Coordination normal.  Psychiatric:        Attention and Perception: Attention and perception normal. He does not perceive auditory or visual hallucinations.        Mood and Affect: Mood is depressed. Mood is not anxious. Affect is not labile, blunt, tearful or inappropriate.        Speech: Speech normal.        Behavior: Behavior normal.        Thought Content: Thought content normal. Thought content is not delusional. Thought content does not include homicidal or suicidal ideation. Thought content does not include suicidal plan.        Cognition and Memory: Cognition and memory normal.        Judgment: Judgment normal.     Comments: Insight intact. No delusions.  More irritable.      Lab Review:     Component Value Date/Time   NA 137 04/15/2019 0308   K 4.1 04/15/2019 0308   CL 104 04/15/2019 0308   CO2 22 04/15/2019 0308   GLUCOSE 158 (H) 04/15/2019 0308   BUN 17 04/15/2019 0308   CREATININE 1.15 04/15/2019 0308   CALCIUM 8.6 (L) 04/15/2019 0308   PROT 7.0 04/06/2019 1224   ALBUMIN 4.2 04/06/2019 1224   AST 26 04/06/2019 1224   ALT 24 04/06/2019 1224   ALKPHOS 62 04/06/2019 1224   BILITOT 0.5  04/06/2019 1224   GFRNONAA >60 04/15/2019 0308   GFRAA >60 04/15/2019 0308       Component Value Date/Time   WBC 13.3 (H) 04/15/2019 0308   RBC 4.73 04/15/2019 0308   HGB 14.6 04/15/2019 0308   HCT 44.2 04/15/2019 0308   PLT 202 04/15/2019 0308   MCV 93.4 04/15/2019 0308   MCH 30.9 04/15/2019 0308   MCHC 33.0 04/15/2019 0308   RDW 12.4 04/15/2019 0308   LYMPHSABS 1.5 04/06/2019 1224   MONOABS 0.7 04/06/2019 1224   EOSABS 0.4 04/06/2019 1224   BASOSABS 0.1 04/06/2019 1224    No results found for: "POCLITH", "LITHIUM"   No results found for: "PHENYTOIN", "PHENOBARB", "VALPROATE", "CBMZ"   .res Assessment: Plan:    Jadarrius was seen today for follow-up, depression and anxiety.  Diagnoses and all orders for this visit:  Major depressive disorder, recurrent episode, moderate (HCC)  Generalized anxiety disorder   Cont med long term DT high relapse risk.  Has a long history of depression and anxiety.  Has had multiple relapses before.  Been under my care since 2006.   Option lamotrigine, lithium alone, Viibryd. To avoid sexual SE will pick Trintellix or Viibryd.   Increase Viibryd to  20 mg daily for depression. Lithium 150 mg daily for mood  No SE  Follow-up 2 mos  Stephanie Acre, MD, DFAPA  Please see After Visit Summary for patient specific instructions.  Future Appointments  Date Time Provider [place]  02/14/2022  4:30 PM Cottle, Billey Co., MD CP-CP None     No orders of the defined types were placed in this  encounter.   -------------------------------

## 2021-12-11 ENCOUNTER — Ambulatory Visit
Admission: EM | Admit: 2021-12-11 | Discharge: 2021-12-11 | Disposition: A | Discharge status: Home / Self Care | Payer: PPO

## 2021-12-11 ENCOUNTER — Ambulatory Visit: Payer: Self-pay

## 2021-12-11 DIAGNOSIS — S0993XA Unspecified injury of face, initial encounter: Secondary | ICD-10-CM | POA: Diagnosis not present

## 2021-12-11 DIAGNOSIS — W19XXXA Unspecified fall, initial encounter: Secondary | ICD-10-CM | POA: Diagnosis not present

## 2021-12-11 NOTE — ED Triage Notes (Signed)
Pt presents with abrasion on right knee and facial laceration under left eye and some bruising on face after falling off his tractor this evening.

## 2021-12-11 NOTE — ED Provider Notes (Signed)
[facility]    CSN: [id number] Arrival date & time: 12/11/21  1634      History   Chief Complaint Chief Complaint  Patient presents with   Fall    HPI Jake Collins is a 76 y.o. male presenting with facial trauma following fall from tractor. States was trying to get down from tractor when he tripped and landed face first on the ground. Also landed on the R knee, which is minimally painful. He does not take anticoagulation. 2/10 throbbing headache. Has taken aspirin.  Pain around the eye but denies pain with eye movements.  Denies vision changes.  Denies severe headache, dizziness, thunderclap headache, vision changes, weakness, shortness of breath, chest pain.  HPI  Past Medical History:  Diagnosis Date   Arthritis    Cancer ([place])    skin melanoma   Diverticulitis    2 bouts in 10 years   Hyperlipemia    Hypertension    Hypothyroid    Scoliosis    Thyroid disease     Patient Active Problem List   Diagnosis Date Noted   OA (osteoarthritis) of hip 04/14/2019   GAD (generalized anxiety disorder) 07/23/2018   Bifascicular block 07/01/2016   Hypertensive heart disease without heart failure 07/01/2016   Aortic dilatation ([place]) 06/30/2016   Glaucoma 05/29/2011   Hypothyroid 05/29/2011   Dyslipidemia 05/29/2011   Depression 05/29/2011   Appendicitis, acute 05/28/2011    Past Surgical History:  Procedure Laterality Date   HERNIA REPAIR     KNEE ARTHROSCOPY      x2   LAPAROSCOPIC APPENDECTOMY  05/28/2011   Procedure: APPENDECTOMY LAPAROSCOPIC;  Surgeon: Edward Jolly, MD;  Location: [facility];  Service: General;  Laterality: N/A;   ROTATOR CUFF REPAIR     bil    SALIVARY GLAND SURGERY     L side   SKIN CANCER EXCISION     on his back and was benign   TOTAL HIP ARTHROPLASTY Left 04/14/2019   Procedure: TOTAL HIP ARTHROPLASTY ANTERIOR APPROACH;  Surgeon: Gaynelle Arabian, MD;  Location: [facility];  Service: Orthopedics;  Laterality: Left;  179mn        Home Medications    Prior to Admission medications   Medication Sig Start Date End Date Taking? Authorizing Provider  cholecalciferol (VITAMIN D3) 25 MCG (1000 UT) tablet Take 1,000 Units by mouth daily.    [provider]  Cyanocobalamin (B-12) 5000 MCG CAPS Take 5,000 mcg by mouth daily.    [provider]  dorzolamide (TRUSOPT) 2 % ophthalmic solution Place 1 drop into the left eye 2 (two) times daily. 06/23/13   [provider]  EPINEPHrine 0.3 mg/0.3 mL IJ SOAJ injection Inject 0.3 mg into the muscle as needed for anaphylaxis. 01/13/19   [provider]  famotidine (PEPCID) 20 MG tablet Take 20 mg by mouth daily.    [provider]  glucosamine-chondroitin 500-400 MG tablet Take 1 tablet by mouth daily.    [provider]  hydrochlorothiazide (MICROZIDE) 12.5 MG capsule Take 12.5 mg by mouth daily.    [provider]  HYDROcodone-acetaminophen (NORCO/VICODIN) 5-325 MG tablet Take 1-2 tablets by mouth every 6 (six) hours as needed for moderate pain (pain score 4-6). 04/15/19   Porterfield, Amber, PA-C  latanoprost (XALATAN) 0.005 % ophthalmic solution Place 1 drop into both eyes at bedtime.    [provider]  levothyroxine (SYNTHROID, LEVOTHROID) 100 MCG tablet Take 100 mcg by mouth daily.  [provider]  lithium carbonate 150 MG capsule TAKE 1 CAPSULE BY MOUTH AT BEDTIME. 06/18/21   Cottle, Billey Co., MD  nebivolol (BYSTOLIC) 5 MG tablet Take 5 mg by mouth daily.    [provider]  Probiotic Product (PROBIOTIC PO) Take 1 tablet by mouth daily.    [provider]  pyridOXINE (VITAMIN B-6) 100 MG tablet Take 100 mg by mouth daily.    [provider]  REPATHA SURECLICK 001 MG/ML SOAJ Inject 1 mL into the skin every 14 (fourteen) days. 01/24/20   [provider]  Vilazodone HCl (VIIBRYD) 10 MG TABS Take 1 tablet (10 mg total) by mouth daily. 10/30/21   Cottle, Billey Co., MD    Family History Family History  Problem Relation Age of Onset   Heart disease Father     Social History Social History   Tobacco Use   Smoking status: Never   Smokeless tobacco: Never  Substance Use Topics   Alcohol use: No   Drug use: No     Allergies   Sulfa antibiotics   Review of Systems Review of Systems  Neurological:  Positive for headaches.  All other systems reviewed and are negative.    Physical Exam Triage Vital Signs ED Triage Vitals  Enc Vitals Group     BP 12/11/21 1643 (!) 145/82     Pulse Rate 12/11/21 1643 (!) 59     Resp 12/11/21 1643 17     Temp 12/11/21 1643 97.8 F (36.6 C)     Temp Source 12/11/21 1643 Oral     SpO2 12/11/21 1643 94 %     Weight --      Height --      Head Circumference --      Peak Flow --      Pain Score 12/11/21 1647 4     Pain Loc --      Pain Edu? --      Excl. in [place]? --    No data found.  Updated Vital Signs BP (!) 145/82 (BP Location: Left Arm)   Pulse (!) 59   Temp 97.8 F (36.6 C) (Oral)   Resp 17   SpO2 94%   Visual Acuity Right Eye Distance:   Left Eye Distance:   Bilateral Distance:    Right Eye Near:   Left Eye Near:    Bilateral Near:     Physical Exam Vitals reviewed.  Constitutional:      General: He is not in acute distress.    Appearance: Normal appearance. He is not ill-appearing.  HENT:     Head: Abrasion and contusion present.     Comments: L periorbital region with ecchymosis and several shallow lacerations. There is orbital tenderness in the distribution of the ecchymosis. No pain with EOMIs. No mastoid tenderness.  Pulmonary:     Effort: Pulmonary effort is normal.  Skin:    Comments: See image below Bruising and several small shallow lacerations surrounding the L periorbital region.   L knee with small shallow abrasion, no active bleeding. No surrounding bony tenderness.   Neurological:     General: No focal deficit present.     Mental Status: He is alert  and oriented to person, place, and time.     Comments: PERRLA, EOMI. Strength and sensation grossly intact. Gait intact. Ambulates into room unassisted without difficulty.   Psychiatric:        Mood and Affect: Mood normal.  Behavior: Behavior normal.        Thought Content: Thought content normal.        Judgment: Judgment normal.        UC Treatments / Results  Labs (all labs ordered are listed, but only abnormal results are displayed) Labs Reviewed - No data to display  EKG   Radiology No results found.  Procedures Procedures (including critical care time)  Medications Ordered in UC Medications - No data to display  Initial Impression / Assessment and Plan / UC Course  I have reviewed the triage vital signs and the nursing notes.  Pertinent labs & imaging results that were available during my care of the patient were reviewed by me and considered in my medical decision making (see chart for details).     This patient is a  76 y.o. year old male presenting with facial trauma following fall from tractor that occurred 2 hours ago. Does not take a blood thinner.  There is extensive bruising and shallow lacerations to left periorbital region.  There is some orbital tenderness but no pain with extraocular movements.  He we discussed that there is risk of intracranial bleed or orbital fracture.  He ADAMANTLY refuses to go to the emergency department, stating he has waited 3 hours there before and will never go there again.  We discussed that this does not change my medical recommendation, which is still that he go to the emergency department.  He again ADAMANTLY refused to go to the emergency department.  The lacerations surrounding the eye are shallow and already scabbing into place.  There are no suturable lacerations.  I offered to apply a Steri-Strip to the laceration below the left eye, which he ultimately declines as I do not think it would change the potential for  scarring.  Discussed red flag head trauma symptoms including unusual lethargy, vision changes, vision loss, dizziness, pain with eye movements.  Call 911 if these symptoms develop. Patient and wife (who was present entire visit) verbalizes understanding and agreement.   Wounds were cleansed by CMA at patient request.    Final Clinical Impressions(s) / UC Diagnoses   Final diagnoses:  Facial injury, initial encounter  Fall, initial encounter     Discharge Instructions      -My medical recommendation is to head to the emergency department to rule out an intracranial bleed or skull fracture.  I cannot rule out a skull fracture or brain bleed in the urgent care setting, and both things can be life-threatening or permanently disabling.  If you do develop new symptoms like unusual drowsiness, lethargy, vision changes, severe headaches, sudden onset of severe headache-call 911. -The lacerations on your face are shallow, and may have a small scar, but do not need to be sutured. -Wash your wounds with gentle soap and water 1-2 times daily.  Let air dry or gently pat.Avoid cleansing with hydrogen peroxide or alcohol!! -Seek additional medical attention if the wound is getting worse instead of better- redness increasing in size, pain getting worse, new/worsening discharge, new fevers/chills, etc.      ED Prescriptions   None    PDMP not reviewed this encounter.   Hazel Sams, PA-C 12/11/21 1711

## 2021-12-11 NOTE — Discharge Instructions (Addendum)
-  My medical recommendation is to head to the emergency department to rule out an intracranial bleed or skull fracture.  I cannot rule out a skull fracture or brain bleed in the urgent care setting, and both things can be life-threatening or permanently disabling.  If you do develop new symptoms like unusual drowsiness, lethargy, vision changes, severe headaches, sudden onset of severe headache-call 911. -The lacerations on your face are shallow, and may have a small scar, but do not need to be sutured. -Wash your wounds with gentle soap and water 1-2 times daily.  Let air dry or gently pat.Avoid cleansing with hydrogen peroxide or alcohol!! -Seek additional medical attention if the wound is getting worse instead of better- redness increasing in size, pain getting worse, new/worsening discharge, new fevers/chills, etc.

## 2021-12-24 ENCOUNTER — Other Ambulatory Visit: Payer: Self-pay | Admitting: Psychiatry

## 2021-12-24 ENCOUNTER — Telehealth: Payer: Self-pay | Admitting: Psychiatry

## 2021-12-24 DIAGNOSIS — F330 Major depressive disorder, recurrent, mild: Secondary | ICD-10-CM

## 2021-12-24 DIAGNOSIS — F411 Generalized anxiety disorder: Secondary | ICD-10-CM

## 2021-12-24 MED ORDER — VILAZODONE HCL 20 MG PO TABS: 20.0000 mg | ORAL_TABLET | Freq: Every day | ORAL | 0 refills | Status: DC

## 2021-12-24 NOTE — Telephone Encounter (Signed)
Pt stated 20 mg increase is working.Ok to send rx for 20 mg?

## 2021-12-24 NOTE — Telephone Encounter (Signed)
Next visit is 02/14/22. Jake Collins called states that he wants to go from Vilavodone 10 mg to 20 mg. The 10 mg is not helping enough. He is currently taking samples of this. Pharmacy is:  [facility], [address] AT [address]  Phone:  [phone]  Fax:  [phone]

## 2021-12-24 NOTE — Telephone Encounter (Signed)
I agreed to his request to increase the Marye Round down to 20 mg daily for depression.  I sent in the prescription.

## 2021-12-25 DIAGNOSIS — Z1283 Encounter for screening for malignant neoplasm of skin: Secondary | ICD-10-CM | POA: Diagnosis not present

## 2021-12-25 DIAGNOSIS — Z08 Encounter for follow-up examination after completed treatment for malignant neoplasm: Secondary | ICD-10-CM | POA: Diagnosis not present

## 2021-12-25 DIAGNOSIS — Z85820 Personal history of malignant melanoma of skin: Secondary | ICD-10-CM | POA: Diagnosis not present

## 2021-12-25 DIAGNOSIS — L570 Actinic keratosis: Secondary | ICD-10-CM | POA: Diagnosis not present

## 2021-12-25 DIAGNOSIS — D225 Melanocytic nevi of trunk: Secondary | ICD-10-CM | POA: Diagnosis not present

## 2021-12-25 DIAGNOSIS — X32XXXD Exposure to sunlight, subsequent encounter: Secondary | ICD-10-CM | POA: Diagnosis not present

## 2021-12-27 ENCOUNTER — Telehealth: Payer: Self-pay | Admitting: Psychiatry

## 2021-12-27 ENCOUNTER — Other Ambulatory Visit: Payer: Self-pay

## 2021-12-27 DIAGNOSIS — F411 Generalized anxiety disorder: Secondary | ICD-10-CM

## 2021-12-27 DIAGNOSIS — F3342 Major depressive disorder, recurrent, in full remission: Secondary | ICD-10-CM

## 2021-12-27 MED ORDER — LITHIUM CARBONATE 150 MG PO CAPS: 150.0000 mg | ORAL_CAPSULE | Freq: Every day | ORAL | 0 refills | Status: DC

## 2021-12-27 NOTE — Telephone Encounter (Signed)
Resent rx

## 2021-12-27 NOTE — Telephone Encounter (Signed)
Pt called reporting [facility] denied refill for Lithium 150 mg. He only has 3 left. Contact Pt @ [phone] to advise. Contact Pharmacy. Looks like he had 3 RF for 90 days.

## 2022-01-11 DIAGNOSIS — M170 Bilateral primary osteoarthritis of knee: Secondary | ICD-10-CM | POA: Diagnosis not present

## 2022-02-12 DIAGNOSIS — M1712 Unilateral primary osteoarthritis, left knee: Secondary | ICD-10-CM | POA: Diagnosis not present

## 2022-02-12 DIAGNOSIS — M1711 Unilateral primary osteoarthritis, right knee: Secondary | ICD-10-CM | POA: Diagnosis not present

## 2022-02-14 ENCOUNTER — Ambulatory Visit (INDEPENDENT_AMBULATORY_CARE_PROVIDER_SITE_OTHER): Payer: PPO | Admitting: Psychiatry

## 2022-02-14 ENCOUNTER — Encounter: Payer: Self-pay | Admitting: Psychiatry

## 2022-02-14 DIAGNOSIS — F411 Generalized anxiety disorder: Secondary | ICD-10-CM

## 2022-02-14 DIAGNOSIS — F331 Major depressive disorder, recurrent, moderate: Secondary | ICD-10-CM | POA: Diagnosis not present

## 2022-02-14 MED ORDER — VILAZODONE HCL 20 MG PO TABS: 20.0000 mg | ORAL_TABLET | Freq: Every day | ORAL | 3 refills | Status: DC

## 2022-02-14 NOTE — Progress Notes (Signed)
Jake Collins [id number] Aug 03, 1945 76 y.o.  Subjective:   Patient ID:  Jake Collins is a 76 y.o. (DOB 1945/10/17) male.  Chief Complaint:  Chief Complaint  Patient presents with   Follow-up   Depression   Anxiety     Depression        Past medical history includes anxiety.   Anxiety Patient reports no chest pain or palpitations.     Jake Collins presents to the office today for follow-up of major depression and GAD.  seen June 2020 and was doing well and no meds were changed.  The only psychiatric med for the last few years has been venlafaxine 50 mg daily.  12/07/2019 appointment with the following noted: Remained well.   Concerned about venlafaxine sexual SE.  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 Neither he nor wife notced change in mood anxiety since that time.   Has plenty of money and no longer interested in the money.   Wife is concerned he's moody.   Gym 3 times weekly. Plan: Cont med long term DT high relapse risk.  Has a long history of depression and anxiety.  Has had multiple relapses before.  Been under my care since 2006.  He's reduced venlafaxine to 25 mg daily and still complains of sexual SE but wife complains he's moody. Option lamotrigine, lithium alone, Viibryd. Lithium 150 mg daily for mood and wean off venlafaxine  By 12.5 mg daily for 3 weeks and stop it DT sexual SE.  02/10/20 appt with the following noted: No problems off venlafaxine.  No depression.  Sexual SE a little better.  Moodiness is better. Started melatonin and helped sleep.  No SE lithium. Not more anxious.    02/09/21 appt with following noted:  No depression.   Patient denies any recent difficulty with anxiety.  Patient denies difficulty with sleep initiation or maintenance. Denies appetite disturbance.  Patient reports that energy and motivation have been good.  Patient denies any difficulty with concentration.   Patient denies any suicidal ideation. Plan: Lithium 150 mg daily for mood and no problems off of the venlafaxine..   10/10/21 appt noted: Wife not happy with me.  She's had 4th and 5th back surgery.  Does all kinds of things for him.  She says he's moodier than normal.  Money is not a problem. More short tempered than he used to be.  Can't play golf bc of knees.  Will need TKR in the next few mos.  Still goes to gym 3 times weekly.  Would like to get back to golf bc no hobbies. PLAN: To avoid sexual SE will pick Trintellix or Viibryd.   Viibryd 10 mg daily. Lithium 150 mg daily for mood and no problems off of the venlafaxine..  11/29/21 appt noted: No effect Viibryd good or bad.  Takes with breakfast. No SE Would like more benefit.   Sleep good.  Lower appetite but wt ok. Plan: Increase Viibryd to  20 mg daily for depression. Lithium 150 mg daily for mood   02/14/22 appt noted: Some additional benefit mood is better and wife agrees. Never been a big talker but is talking more with her.  Married 59 years.  Doesn't feel depressed.  Less irritable with it.   Going on vacation with family.  No GI px with meds. Taking Pepcid.  Viibryd 20 and lithium 150 mg daily. Sexual SE ED.  Can  put up with it if it helps.  Past Psychiatric Medication Trials:  Lithium, Lexapro, Zoloft, Effexor XR 150 sexual SE, Klonopin   M history psych hosp and ECT.  F neglected him.  Review of Systems:  Review of Systems  Cardiovascular:  Negative for chest pain and palpitations.  Gastrointestinal:  Positive for constipation. Negative for diarrhea.  Genitourinary:        ED  Musculoskeletal:  Positive for arthralgias and gait problem.  Neurological:  Negative for tremors.  Psychiatric/Behavioral:  Negative for dysphoric mood.     Medications: I have reviewed the patient's current medications.  Current Outpatient Medications  Medication Sig Dispense Refill   cholecalciferol (VITAMIN D3) 25 MCG (1000 UT) tablet  Take 1,000 Units by mouth daily.     Cyanocobalamin (B-12) 5000 MCG CAPS Take 5,000 mcg by mouth daily.     dorzolamide (TRUSOPT) 2 % ophthalmic solution Place 1 drop into the left eye 2 (two) times daily.     EPINEPHrine 0.3 mg/0.3 mL IJ SOAJ injection Inject 0.3 mg into the muscle as needed for anaphylaxis.     famotidine (PEPCID) 20 MG tablet Take 20 mg by mouth daily.     glucosamine-chondroitin 500-400 MG tablet Take 1 tablet by mouth daily.     hydrochlorothiazide (MICROZIDE) 12.5 MG capsule Take 12.5 mg by mouth daily.     HYDROcodone-acetaminophen (NORCO/VICODIN) 5-325 MG tablet Take 1-2 tablets by mouth every 6 (six) hours as needed for moderate pain (pain score 4-6). 56 tablet 0   latanoprost (XALATAN) 0.005 % ophthalmic solution Place 1 drop into both eyes at bedtime.     levothyroxine (SYNTHROID, LEVOTHROID) 100 MCG tablet Take 100 mcg by mouth daily.       lithium carbonate 150 MG capsule Take 1 capsule (150 mg total) by mouth at bedtime. 90 capsule 0   nebivolol (BYSTOLIC) 5 MG tablet Take 5 mg by mouth daily.     Probiotic Product (PROBIOTIC PO) Take 1 tablet by mouth daily.     pyridOXINE (VITAMIN B-6) 100 MG tablet Take 100 mg by mouth daily.     REPATHA SURECLICK 431 MG/ML SOAJ Inject 1 mL into the skin every 14 (fourteen) days.     Vilazodone HCl 20 MG TABS Take 1 tablet (20 mg total) by mouth daily at 12 noon. 90 tablet 3   No current facility-administered medications for this visit.    Medication Side Effects: sexual SE  Allergies:  Allergies  Allergen Reactions   Sulfa Antibiotics     Katherina Right syndrome     Past Medical History:  Diagnosis Date   Arthritis    Cancer ([place])    skin melanoma   Diverticulitis    2 bouts in 10 years   Hyperlipemia    Hypertension    Hypothyroid    Scoliosis    Thyroid disease     Family History  Problem Relation Age of Onset   Heart disease Father     Social History   Socioeconomic History   Marital status:  Married    Spouse name: Not on file   Number of children: Not on file   Years of education: Not on file   Highest education level: Not on file  Occupational History   Not on file  Tobacco Use   Smoking status: Never   Smokeless tobacco: Never  Substance and Sexual Activity   Alcohol use: No   Drug use: No   Sexual activity: Not Currently  Other  Topics Concern   Not on file  Social History Narrative   Not on file   Social Determinants of Health   Financial Resource Strain: Not on file  Food Insecurity: Not on file  Transportation Needs: Not on file  Physical Activity: Not on file  Stress: Not on file  Social Connections: Not on file  Intimate Partner Violence: Not on file    Past Medical History, Surgical history, Social history, and Family history were reviewed and updated as appropriate.   Please see review of systems for further details on the patient's review from today.   Objective:   Physical Exam:  There were no vitals taken for this visit.  Physical Exam Constitutional:      General: He is not in acute distress.    Appearance: He is well-developed.  Musculoskeletal:        General: No deformity.  Neurological:     Mental Status: He is alert and oriented to person, place, and time.     Coordination: Coordination normal.  Psychiatric:        Attention and Perception: Attention and perception normal. He does not perceive auditory or visual hallucinations.        Mood and Affect: Mood is depressed. Mood is not anxious. Affect is not labile, blunt, tearful or inappropriate.        Speech: Speech normal.        Behavior: Behavior normal.        Thought Content: Thought content normal. Thought content is not delusional. Thought content does not include homicidal or suicidal ideation. Thought content does not include suicidal plan.        Cognition and Memory: Cognition and memory normal.        Judgment: Judgment normal.     Comments: Insight intact. No  delusions.  More irritable.      Lab Review:     Component Value Date/Time   NA 137 04/15/2019 0308   K 4.1 04/15/2019 0308   CL 104 04/15/2019 0308   CO2 22 04/15/2019 0308   GLUCOSE 158 (H) 04/15/2019 0308   BUN 17 04/15/2019 0308   CREATININE 1.15 04/15/2019 0308   CALCIUM 8.6 (L) 04/15/2019 0308   PROT 7.0 04/06/2019 1224   ALBUMIN 4.2 04/06/2019 1224   AST 26 04/06/2019 1224   ALT 24 04/06/2019 1224   ALKPHOS 62 04/06/2019 1224   BILITOT 0.5 04/06/2019 1224   GFRNONAA >60 04/15/2019 0308   GFRAA >60 04/15/2019 0308       Component Value Date/Time   WBC 13.3 (H) 04/15/2019 0308   RBC 4.73 04/15/2019 0308   HGB 14.6 04/15/2019 0308   HCT 44.2 04/15/2019 0308   PLT 202 04/15/2019 0308   MCV 93.4 04/15/2019 0308   MCH 30.9 04/15/2019 0308   MCHC 33.0 04/15/2019 0308   RDW 12.4 04/15/2019 0308   LYMPHSABS 1.5 04/06/2019 1224   MONOABS 0.7 04/06/2019 1224   EOSABS 0.4 04/06/2019 1224   BASOSABS 0.1 04/06/2019 1224    No results found for: "POCLITH", "LITHIUM"   No results found for: "PHENYTOIN", "PHENOBARB", "VALPROATE", "CBMZ"   .res Assessment: Plan:    Nickalus was seen today for follow-up, depression and anxiety.  Diagnoses and all orders for this visit:  Major depressive disorder, recurrent episode, moderate (HCC) -     Vilazodone HCl 20 MG TABS; Take 1 tablet (20 mg total) by mouth daily at 12 noon.  Generalized anxiety disorder -     Vilazodone  HCl 20 MG TABS; Take 1 tablet (20 mg total) by mouth daily at 12 noon.   Cont med long term DT high relapse risk.  Has a long history of depression and anxiety.  Has had multiple relapses before.  Been under my care since 2006.   Option lamotrigine, lithium alone, Viibryd. Is causing some sexual SE To avoid sexual SE consider Auvelity but cost might be a problem  Better with Viibryd to  20 mg daily for depression. Lithium 150 mg daily for mood  No SE  Follow-up 3-4 mos  Stephanie Acre, MD,  DFAPA  Please see After Visit Summary for patient specific instructions.  No future appointments.    No orders of the defined types were placed in this encounter.   -------------------------------

## 2022-02-18 DIAGNOSIS — M170 Bilateral primary osteoarthritis of knee: Secondary | ICD-10-CM | POA: Diagnosis not present

## 2022-03-03 ENCOUNTER — Other Ambulatory Visit: Payer: Self-pay | Admitting: Psychiatry

## 2022-03-03 DIAGNOSIS — F411 Generalized anxiety disorder: Secondary | ICD-10-CM

## 2022-03-03 DIAGNOSIS — F3342 Major depressive disorder, recurrent, in full remission: Secondary | ICD-10-CM

## 2022-03-05 ENCOUNTER — Telehealth: Payer: Self-pay | Admitting: Psychiatry

## 2022-03-05 ENCOUNTER — Other Ambulatory Visit: Payer: Self-pay

## 2022-03-05 DIAGNOSIS — F331 Major depressive disorder, recurrent, moderate: Secondary | ICD-10-CM

## 2022-03-05 DIAGNOSIS — F411 Generalized anxiety disorder: Secondary | ICD-10-CM

## 2022-03-05 MED ORDER — VILAZODONE HCL 20 MG PO TABS: 20.0000 mg | ORAL_TABLET | Freq: Every day | ORAL | 3 refills | Status: DC

## 2022-03-05 NOTE — Telephone Encounter (Signed)
PT called and said that he wants his vilazodone 20 mg cancelled at [facility]. It is to expensive there. He would like a new script sent to the [facility] located at [address] in [place].

## 2022-03-05 NOTE — Telephone Encounter (Signed)
Rx sent 

## 2022-04-01 DIAGNOSIS — H401111 Primary open-angle glaucoma, right eye, mild stage: Secondary | ICD-10-CM | POA: Diagnosis not present

## 2022-04-01 DIAGNOSIS — M1711 Unilateral primary osteoarthritis, right knee: Secondary | ICD-10-CM | POA: Diagnosis not present

## 2022-04-01 DIAGNOSIS — H401123 Primary open-angle glaucoma, left eye, severe stage: Secondary | ICD-10-CM | POA: Diagnosis not present

## 2022-05-07 DIAGNOSIS — K219 Gastro-esophageal reflux disease without esophagitis: Secondary | ICD-10-CM | POA: Diagnosis not present

## 2022-05-07 DIAGNOSIS — R5383 Other fatigue: Secondary | ICD-10-CM | POA: Diagnosis not present

## 2022-05-07 DIAGNOSIS — E039 Hypothyroidism, unspecified: Secondary | ICD-10-CM | POA: Diagnosis not present

## 2022-05-07 DIAGNOSIS — Z23 Encounter for immunization: Secondary | ICD-10-CM | POA: Diagnosis not present

## 2022-05-07 DIAGNOSIS — D489 Neoplasm of uncertain behavior, unspecified: Secondary | ICD-10-CM | POA: Diagnosis not present

## 2022-05-07 DIAGNOSIS — E7800 Pure hypercholesterolemia, unspecified: Secondary | ICD-10-CM | POA: Diagnosis not present

## 2022-05-07 DIAGNOSIS — I10 Essential (primary) hypertension: Secondary | ICD-10-CM | POA: Diagnosis not present

## 2022-05-08 DIAGNOSIS — C44629 Squamous cell carcinoma of skin of left upper limb, including shoulder: Secondary | ICD-10-CM | POA: Diagnosis not present

## 2022-05-16 DIAGNOSIS — M1711 Unilateral primary osteoarthritis, right knee: Secondary | ICD-10-CM | POA: Diagnosis not present

## 2022-06-06 ENCOUNTER — Telehealth: Payer: Self-pay | Admitting: Psychiatry

## 2022-06-06 ENCOUNTER — Other Ambulatory Visit: Payer: Self-pay

## 2022-06-06 DIAGNOSIS — F331 Major depressive disorder, recurrent, moderate: Secondary | ICD-10-CM

## 2022-06-06 DIAGNOSIS — F411 Generalized anxiety disorder: Secondary | ICD-10-CM

## 2022-06-06 MED ORDER — VILAZODONE HCL 20 MG PO TABS: 20.0000 mg | ORAL_TABLET | Freq: Every day | ORAL | 0 refills | Status: DC

## 2022-06-06 NOTE — Telephone Encounter (Signed)
Jake Collins today at 9:35 to request refill of his Vilazodone '20mg'$ .  Appt 06/20/22.  Send to [facility] pharmacy in [place].

## 2022-06-06 NOTE — Telephone Encounter (Signed)
Rx sent 

## 2022-06-07 ENCOUNTER — Other Ambulatory Visit: Payer: Self-pay | Admitting: Psychiatry

## 2022-06-07 DIAGNOSIS — F3342 Major depressive disorder, recurrent, in full remission: Secondary | ICD-10-CM

## 2022-06-07 DIAGNOSIS — F411 Generalized anxiety disorder: Secondary | ICD-10-CM

## 2022-06-14 ENCOUNTER — Telehealth: Payer: Self-pay | Admitting: Psychiatry

## 2022-06-14 NOTE — Telephone Encounter (Signed)
It is the correct med, just a different manufacturer: Pill with imprint MV 15 is Orange, Oval and has been identified as Vilazodone Hydrochloride 20 mg. It is supplied by [employer].   He should not see a difference in effect.

## 2022-06-14 NOTE — Telephone Encounter (Signed)
Please advise 

## 2022-06-14 NOTE — Telephone Encounter (Signed)
Pt informed

## 2022-06-14 NOTE — Telephone Encounter (Signed)
Jake Collins called this morning at 9:55 to report that he picked up his vilazodone HCl '20mg'$  and that the pill has 15 stamped in it and is a different color.  So his concerned he doesn't have the correct medication.  He asked the pharmacy but they told him it was correct, but is not comfortable with this discrepancy.  Please call him to let him know whether or not it is correct or whether the pharmacy needs to correct their mistake.

## 2022-06-20 ENCOUNTER — Encounter: Payer: Self-pay | Admitting: Psychiatry

## 2022-06-20 ENCOUNTER — Ambulatory Visit (INDEPENDENT_AMBULATORY_CARE_PROVIDER_SITE_OTHER): Payer: PPO | Admitting: Psychiatry

## 2022-06-20 DIAGNOSIS — F411 Generalized anxiety disorder: Secondary | ICD-10-CM | POA: Diagnosis not present

## 2022-06-20 DIAGNOSIS — F3342 Major depressive disorder, recurrent, in full remission: Secondary | ICD-10-CM | POA: Diagnosis not present

## 2022-06-20 MED ORDER — LITHIUM CARBONATE 150 MG PO CAPS: 150.0000 mg | ORAL_CAPSULE | Freq: Every evening | ORAL | 1 refills | Status: DC

## 2022-06-20 MED ORDER — VILAZODONE HCL 20 MG PO TABS: 20.0000 mg | ORAL_TABLET | Freq: Every day | ORAL | 1 refills | Status: DC

## 2022-06-20 NOTE — Progress Notes (Signed)
Jake Collins [id number] Aug 03, 1945 77 y.o.  Subjective:   Patient ID:  Jake Collins is a 77 y.o. (DOB 1945/10/17) male.  Chief Complaint:  Chief Complaint  Patient presents with   Follow-up   Depression   Anxiety     Depression        Past medical history includes anxiety.   Anxiety Patient reports no chest pain or palpitations.     Jake Collins presents to the office today for follow-up of major depression and GAD.  seen June 2020 and was doing well and no meds were changed.  The only psychiatric med for the last few years has been venlafaxine 50 mg daily.  12/07/2019 appointment with the following noted: Remained well.   Concerned about venlafaxine sexual SE.  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 Neither he nor wife notced change in mood anxiety since that time.   Has plenty of money and no longer interested in the money.   Wife is concerned he's moody.   Gym 3 times weekly. Plan: Cont med long term DT high relapse risk.  Has a long history of depression and anxiety.  Has had multiple relapses before.  Been under my care since 2006.  He's reduced venlafaxine to 25 mg daily and still complains of sexual SE but wife complains he's moody. Option lamotrigine, lithium alone, Viibryd. Lithium 150 mg daily for mood and wean off venlafaxine  By 12.5 mg daily for 3 weeks and stop it DT sexual SE.  02/10/20 appt with the following noted: No problems off venlafaxine.  No depression.  Sexual SE a little better.  Moodiness is better. Started melatonin and helped sleep.  No SE lithium. Not more anxious.    02/09/21 appt with following noted:  No depression.   Patient denies any recent difficulty with anxiety.  Patient denies difficulty with sleep initiation or maintenance. Denies appetite disturbance.  Patient reports that energy and motivation have been good.  Patient denies any difficulty with concentration.   Patient denies any suicidal ideation. Plan: Lithium 150 mg daily for mood and no problems off of the venlafaxine..   10/10/21 appt noted: Wife not happy with me.  She's had 4th and 5th back surgery.  Does all kinds of things for him.  She says he's moodier than normal.  Money is not a problem. More short tempered than he used to be.  Can't play golf bc of knees.  Will need TKR in the next few mos.  Still goes to gym 3 times weekly.  Would like to get back to golf bc no hobbies. PLAN: To avoid sexual SE will pick Trintellix or Viibryd.   Viibryd 10 mg daily. Lithium 150 mg daily for mood and no problems off of the venlafaxine..  11/29/21 appt noted: No effect Viibryd good or bad.  Takes with breakfast. No SE Would like more benefit.   Sleep good.  Lower appetite but wt ok. Plan: Increase Viibryd to  20 mg daily for depression. Lithium 150 mg daily for mood   02/14/22 appt noted: Some additional benefit mood is better and wife agrees. Never been a big talker but is talking more with her.  Married 59 years.  Doesn't feel depressed.  Less irritable with it.   Going on vacation with family.  No GI px with meds. Taking Pepcid.  Viibryd 20 and lithium 150 mg daily. Sexual SE ED.  Can  put up with it if it helps. Plan: Better with Viibryd to  20 mg daily for depression. Lithium 150 mg daily for mood   06/20/22 appt noted: Got confused about a change in generic.   Mood is good and pleased with response to Viibryd. Disc low dose lithium. Doing well doc.  Past Psychiatric Medication Trials:  Lithium, Lexapro, Zoloft, Effexor XR 150 sexual SE, Klonopin   M history psych hosp and ECT.  F neglected him.  Review of Systems:  Review of Systems  Cardiovascular:  Negative for chest pain and palpitations.  Gastrointestinal:  Positive for constipation. Negative for diarrhea.  Genitourinary:        ED  Musculoskeletal:  Positive for arthralgias and gait problem.  Neurological:  Negative for tremors.   Psychiatric/Behavioral:  Negative for dysphoric mood.     Medications: I have reviewed the patient's current medications.  Current Outpatient Medications  Medication Sig Dispense Refill   cholecalciferol (VITAMIN D3) 25 MCG (1000 UT) tablet Take 1,000 Units by mouth daily.     Cyanocobalamin (B-12) 5000 MCG CAPS Take 5,000 mcg by mouth daily.     dorzolamide (TRUSOPT) 2 % ophthalmic solution Place 1 drop into the left eye 2 (two) times daily.     EPINEPHrine 0.3 mg/0.3 mL IJ SOAJ injection Inject 0.3 mg into the muscle as needed for anaphylaxis.     famotidine (PEPCID) 20 MG tablet Take 20 mg by mouth daily.     glucosamine-chondroitin 500-400 MG tablet Take 1 tablet by mouth daily.     hydrochlorothiazide (MICROZIDE) 12.5 MG capsule Take 12.5 mg by mouth daily.     HYDROcodone-acetaminophen (NORCO/VICODIN) 5-325 MG tablet Take 1-2 tablets by mouth every 6 (six) hours as needed for moderate pain (pain score 4-6). 56 tablet 0   latanoprost (XALATAN) 0.005 % ophthalmic solution Place 1 drop into both eyes at bedtime.     levothyroxine (SYNTHROID, LEVOTHROID) 100 MCG tablet Take 100 mcg by mouth daily.       lithium carbonate 150 MG capsule TAKE 1 CAPSULE BY MOUTH EVERYDAY AT BEDTIME 90 capsule 0   nebivolol (BYSTOLIC) 5 MG tablet Take 5 mg by mouth daily.     Probiotic Product (PROBIOTIC PO) Take 1 tablet by mouth daily.     pyridOXINE (VITAMIN B-6) 100 MG tablet Take 100 mg by mouth daily.     REPATHA SURECLICK 500 MG/ML SOAJ Inject 1 mL into the skin every 14 (fourteen) days.     Vilazodone HCl 20 MG TABS Take 1 tablet (20 mg total) by mouth daily at 12 noon. 90 tablet 0   No current facility-administered medications for this visit.    Medication Side Effects: sexual SE  Allergies:  Allergies  Allergen Reactions   Sulfa Antibiotics     Katherina Right syndrome     Past Medical History:  Diagnosis Date   Arthritis    Cancer ([place])    skin melanoma   Diverticulitis    2 bouts  in 10 years   Hyperlipemia    Hypertension    Hypothyroid    Scoliosis    Thyroid disease     Family History  Problem Relation Age of Onset   Heart disease Father     Social History   Socioeconomic History   Marital status: Married    Spouse name: Not on file   Number of children: Not on file   Years of education: Not on file   Highest education level: Not on  file  Occupational History   Not on file  Tobacco Use   Smoking status: Never   Smokeless tobacco: Never  Substance and Sexual Activity   Alcohol use: No   Drug use: No   Sexual activity: Not Currently  Other Topics Concern   Not on file  Social History Narrative   Not on file   Social Determinants of Health   Financial Resource Strain: Not on file  Food Insecurity: Not on file  Transportation Needs: Not on file  Physical Activity: Not on file  Stress: Not on file  Social Connections: Not on file  Intimate Partner Violence: Not on file    Past Medical History, Surgical history, Social history, and Family history were reviewed and updated as appropriate.   Please see review of systems for further details on the patient's review from today.   Objective:   Physical Exam:  There were no vitals taken for this visit.  Physical Exam Constitutional:      General: He is not in acute distress.    Appearance: He is well-developed.  Musculoskeletal:        General: No deformity.  Neurological:     Mental Status: He is alert and oriented to person, place, and time.     Coordination: Coordination normal.  Psychiatric:        Attention and Perception: Attention and perception normal. He does not perceive auditory or visual hallucinations.        Mood and Affect: Mood is depressed. Mood is not anxious. Affect is not labile, blunt, tearful or inappropriate.        Speech: Speech normal.        Behavior: Behavior normal.        Thought Content: Thought content normal. Thought content is not delusional. Thought  content does not include homicidal or suicidal ideation. Thought content does not include suicidal plan.        Cognition and Memory: Cognition and memory normal.        Judgment: Judgment normal.     Comments: Insight intact. No delusions.  More irritable.      Lab Review:     Component Value Date/Time   NA 137 04/15/2019 0308   K 4.1 04/15/2019 0308   CL 104 04/15/2019 0308   CO2 22 04/15/2019 0308   GLUCOSE 158 (H) 04/15/2019 0308   BUN 17 04/15/2019 0308   CREATININE 1.15 04/15/2019 0308   CALCIUM 8.6 (L) 04/15/2019 0308   PROT 7.0 04/06/2019 1224   ALBUMIN 4.2 04/06/2019 1224   AST 26 04/06/2019 1224   ALT 24 04/06/2019 1224   ALKPHOS 62 04/06/2019 1224   BILITOT 0.5 04/06/2019 1224   GFRNONAA >60 04/15/2019 0308   GFRAA >60 04/15/2019 0308       Component Value Date/Time   WBC 13.3 (H) 04/15/2019 0308   RBC 4.73 04/15/2019 0308   HGB 14.6 04/15/2019 0308   HCT 44.2 04/15/2019 0308   PLT 202 04/15/2019 0308   MCV 93.4 04/15/2019 0308   MCH 30.9 04/15/2019 0308   MCHC 33.0 04/15/2019 0308   RDW 12.4 04/15/2019 0308   LYMPHSABS 1.5 04/06/2019 1224   MONOABS 0.7 04/06/2019 1224   EOSABS 0.4 04/06/2019 1224   BASOSABS 0.1 04/06/2019 1224    No results found for: "POCLITH", "LITHIUM"   No results found for: "PHENYTOIN", "PHENOBARB", "VALPROATE", "CBMZ"   .res Assessment: Plan:    There are no diagnoses linked to this encounter.  Cont med long  term DT high relapse risk.  Has a long history of depression and anxiety.  Has had multiple relapses before.  Been under my care since 2006.   Option lamotrigine, lithium alone, Viibryd. Is causing some sexual SE To avoid sexual SE consider Auvelity but cost might be a problem  Better with Viibryd to  20 mg daily for depression. Lithium 150 mg daily for mood  No SE  Follow-up 6 mos  Stephanie Acre, MD, DFAPA  Please see After Visit Summary for patient specific instructions.  Future Appointments  Date Time  Provider [place]  06/27/2022  1:00 PM [facility] PAT 5 [facility] None      No orders of the defined types were placed in this encounter.   -------------------------------

## 2022-06-25 DIAGNOSIS — X32XXXD Exposure to sunlight, subsequent encounter: Secondary | ICD-10-CM | POA: Diagnosis not present

## 2022-06-25 DIAGNOSIS — L0202 Furuncle of face: Secondary | ICD-10-CM | POA: Diagnosis not present

## 2022-06-25 DIAGNOSIS — Z08 Encounter for follow-up examination after completed treatment for malignant neoplasm: Secondary | ICD-10-CM | POA: Diagnosis not present

## 2022-06-25 DIAGNOSIS — Z85828 Personal history of other malignant neoplasm of skin: Secondary | ICD-10-CM | POA: Diagnosis not present

## 2022-06-25 DIAGNOSIS — B9689 Other specified bacterial agents as the cause of diseases classified elsewhere: Secondary | ICD-10-CM | POA: Diagnosis not present

## 2022-06-25 DIAGNOSIS — C4441 Basal cell carcinoma of skin of scalp and neck: Secondary | ICD-10-CM | POA: Diagnosis not present

## 2022-06-25 DIAGNOSIS — L570 Actinic keratosis: Secondary | ICD-10-CM | POA: Diagnosis not present

## 2022-06-27 ENCOUNTER — Encounter (HOSPITAL_COMMUNITY): Admission: RE | Admit: 2022-06-27 | Payer: PPO | Source: Ambulatory Visit

## 2022-06-28 NOTE — Progress Notes (Addendum)
COVID Vaccine Completed:  Yes  Date of COVID positive in last 90 days:  No  PCP - Donnie Coffin, MD Cardiologist - Daneen Schick, MD (last OV 2020) for fatigue, HTN  Chest x-ray - N/A EKG - 07-01-22 Epic Stress Test - 07-04-16 Epic ECHO - 07-04-16 Epic Cardiac Cath - N/A Pacemaker/ICD device last checked: Spinal Cord Stimulator:  N/A  Bowel Prep - N/A  Sleep Study - N/A CPAP -   Fasting Blood Sugar - N/A Checks Blood Sugar _____ times a day  Last dose of GLP1 agonist-  N/A GLP1 instructions:  N/A   Last dose of SGLT-2 inhibitors-  N/A SGLT-2 instructions: N/A  Blood Thinner Instructions:  N/A Aspirin Instructions: Last Dose:  Activity level:  Can go up a flight of stairs and perform activities of daily living without stopping and without symptoms of chest pain or shortness of breath. Able to exercise without symptoms, goes to gym 3 times a week   Anesthesia review:  N/A  Patient denies shortness of breath, fever, cough and chest pain at PAT appointment  Patient verbalized understanding of instructions that were given to them at the PAT appointment. Patient was also instructed that they will need to review over the PAT instructions again at home before surgery.

## 2022-06-28 NOTE — Patient Instructions (Addendum)
SURGICAL WAITING ROOM VISITATION Patients having surgery or a procedure may have no more than 2 support people in the waiting area - these visitors may rotate.    If the patient needs to stay at the hospital during part of their recovery, the visitor guidelines for inpatient rooms apply. Pre-op nurse will coordinate an appropriate time for 1 support person to accompany patient in pre-op.  This support person may not rotate.    Please refer to the [facility] website for the visitor guidelines for Inpatients (after your surgery is over and you are in a regular room).   Due to an increase in RSV and influenza rates and associated hospitalizations, children ages 50 and under may not visit patients in [place].     Your procedure is scheduled on: 07-08-22   Report to [facility] Main Entrance    Report to admitting at 7:55 AM   Call this number if you have problems the morning of surgery [phone]   Do not eat food :After Midnight.   After Midnight you may have the following liquids until 7:25 AM DAY OF SURGERY  Water Non-Citrus Juices (without pulp, NO RED) Carbonated Beverages Black Coffee (NO MILK/CREAM OR CREAMERS, sugar ok)  Clear Tea (NO MILK/CREAM OR CREAMERS, sugar ok) regular and decaf                             Plain Jell-O (NO RED)                                           Fruit ices (not with fruit pulp, NO RED)                                     Popsicles (NO RED)                                                               Sports drinks like Gatorade (NO RED)                   The day of surgery:  Drink ONE (1) Pre-Surgery Clear Ensure at 7:25  AM the morning of surgery. Drink in one sitting. Do not sip.  This drink was given to you during your hospital  pre-op appointment visit. Nothing else to drink after completing the Pre-Surgery Clear Ensure.          If you have questions, please contact your surgeon's office.   FOLLOW  ANY  ADDITIONAL PRE OP INSTRUCTIONS YOU RECEIVED FROM YOUR SURGEON'S OFFICE!!!     Oral Hygiene is also important to reduce your risk of infection.                                    Remember - BRUSH YOUR TEETH THE MORNING OF SURGERY WITH YOUR REGULAR TOOTHPASTE   Do NOT smoke after Midnight   Take these medicines the morning of surgery with A SIP OF WATER:  Famotidine (Pepcid)  Levothyroxine  Nebivolol (Bystolic)  Vilazodone  Okay to use eyedrops                              You may not have any metal on your body including jewelry, and body piercing             Do not wear lotions, powders, cologne, or deodorant              Men may shave face and neck.   Do not bring valuables to the hospital. [place].   Contacts, dentures or bridgework may not be worn into surgery.   Bring small overnight bag day of surgery.   DO NOT [place]. PHARMACY WILL DISPENSE MEDICATIONS LISTED ON YOUR MEDICATION LIST TO YOU DURING YOUR ADMISSION [place]!     Special Instructions: Bring a copy of your healthcare power of attorney and living will documents the day of surgery if you haven't scanned them before.              Please read over the following fact sheets you were given: IF Jake Collins  If you received a COVID test during your pre-op visit  it is requested that you wear a mask when out in public, stay away from anyone that may not be feeling well and notify your surgeon if you develop symptoms. If you test positive for Covid or have been in contact with anyone that has tested positive in the last 10 days please notify you surgeon.  [place] - Preparing for Surgery Before surgery, you can play an important role.  Because skin is not sterile, your skin needs to be as free of germs as possible.  You can reduce the number of germs on your skin by  washing with CHG (chlorahexidine gluconate) soap before surgery.  CHG is an antiseptic cleaner which kills germs and bonds with the skin to continue killing germs even after washing. Please DO NOT use if you have an allergy to CHG or antibacterial soaps.  If your skin becomes reddened/irritated stop using the CHG and inform your nurse when you arrive at Short Stay. Do not shave (including legs and underarms) for at least 48 hours prior to the first CHG shower.  You may shave your face/neck.  Please follow these instructions carefully:  1.  Shower with CHG Soap the night before surgery and the  morning of surgery.  2.  If you choose to wash your hair, wash your hair first as usual with your normal  shampoo.  3.  After you shampoo, rinse your hair and body thoroughly to remove the shampoo.                             4.  Use CHG as you would any other liquid soap.  You can apply chg directly to the skin and wash.  Gently with a scrungie or clean washcloth.  5.  Apply the CHG Soap to your body ONLY FROM THE NECK DOWN.   Do   not use on face/ open                           Wound or open sores.  Avoid contact with eyes, ears mouth and   genitals (private parts).                       Wash face,  Genitals (private parts) with your normal soap.             6.  Wash thoroughly, paying special attention to the area where your    surgery  will be performed.  7.  Thoroughly rinse your body with warm water from the neck down.  8.  DO NOT shower/wash with your normal soap after using and rinsing off the CHG Soap.                9.  Pat yourself dry with a clean towel.            10.  Wear clean pajamas.            11.  Place clean sheets on your bed the night of your first shower and do not  sleep with pets. Day of Surgery : Do not apply any lotions/deodorants the morning of surgery.  Please wear clean clothes to the hospital/surgery center.  FAILURE TO FOLLOW THESE INSTRUCTIONS MAY RESULT IN THE CANCELLATION  OF YOUR SURGERY  PATIENT SIGNATURE_________________________________  NURSE SIGNATURE__________________________________  ________________________________________________________________________    Jake Collins  An incentive spirometer is a tool that can help keep your lungs clear and active. This tool measures how well you are filling your lungs with each breath. Taking long deep breaths may help reverse or decrease the chance of developing breathing (pulmonary) problems (especially infection) following: A long period of time when you are unable to move or be active. BEFORE THE PROCEDURE  If the spirometer includes an indicator to show your best effort, your nurse or respiratory therapist will set it to a desired goal. If possible, sit up straight or lean slightly forward. Try not to slouch. Hold the incentive spirometer in an upright position. INSTRUCTIONS FOR USE  Sit on the edge of your bed if possible, or sit up as far as you can in bed or on a chair. Hold the incentive spirometer in an upright position. Breathe out normally. Place the mouthpiece in your mouth and seal your lips tightly around it. Breathe in slowly and as deeply as possible, raising the piston or the ball toward the top of the column. Hold your breath for 3-5 seconds or for as long as possible. Allow the piston or ball to fall to the bottom of the column. Remove the mouthpiece from your mouth and breathe out normally. Rest for a few seconds and repeat Steps 1 through 7 at least 10 times every 1-2 hours when you are awake. Take your time and take a few normal breaths between deep breaths. The spirometer may include an indicator to show your best effort. Use the indicator as a goal to work toward during each repetition. After each set of 10 deep breaths, practice coughing to be sure your lungs are clear. If you have an incision (the cut made at the time of surgery), support your incision when coughing by placing a  pillow or rolled up towels firmly against it. Once you are able to get out of bed, walk around indoors and cough well. You may stop using the incentive spirometer when instructed by your caregiver.  RISKS AND COMPLICATIONS Take your time so you do not get dizzy or light-headed. If you are in pain, you may need to  take or ask for pain medication before doing incentive spirometry. It is harder to take a deep breath if you are having pain. AFTER USE Rest and breathe slowly and easily. It can be helpful to keep track of a log of your progress. Your caregiver can provide you with a simple table to help with this. If you are using the spirometer at home, follow these instructions: [place] IF:  You are having difficultly using the spirometer. You have trouble using the spirometer as often as instructed. Your pain medication is not giving enough relief while using the spirometer. You develop fever of 100.5 F (38.1 C) or higher. SEEK IMMEDIATE MEDICAL CARE IF:  You cough up bloody sputum that had not been present before. You develop fever of 102 F (38.9 C) or greater. You develop worsening pain at or near the incision site. MAKE SURE YOU:  Understand these instructions. Will watch your condition. Will get help right away if you are not doing well or get worse. Document Released: 10/07/2006 Document Revised: 08/19/2011 Document Reviewed: 12/08/2006 [facility] Patient Information 2014 [place], [place].   ________________________________________________________________________

## 2022-07-01 ENCOUNTER — Encounter (HOSPITAL_COMMUNITY)
Admission: RE | Admit: 2022-07-01 | Discharge: 2022-07-01 | Disposition: A | Discharge status: Home / Self Care | Payer: PPO | Source: Ambulatory Visit | Attending: Orthopedic Surgery | Admitting: Orthopedic Surgery

## 2022-07-01 ENCOUNTER — Other Ambulatory Visit: Payer: Self-pay

## 2022-07-01 ENCOUNTER — Encounter (HOSPITAL_COMMUNITY): Payer: Self-pay

## 2022-07-01 VITALS — BP 147/89 | HR 58 | Temp 97.9°F | Resp 20 | Ht 70.0 in | Wt 177.0 lb

## 2022-07-01 DIAGNOSIS — Z01818 Encounter for other preprocedural examination: Secondary | ICD-10-CM

## 2022-07-01 DIAGNOSIS — I2510 Atherosclerotic heart disease of native coronary artery without angina pectoris: Secondary | ICD-10-CM

## 2022-07-01 HISTORY — DX: Depression, unspecified: F32.A

## 2022-07-01 LAB — CBC
HCT: 49.6 % (ref 39.0–52.0)
Hemoglobin: 16.3 g/dL (ref 13.0–17.0)
MCH: 31.2 pg (ref 26.0–34.0)
MCHC: 32.9 g/dL (ref 30.0–36.0)
MCV: 94.8 fL (ref 80.0–100.0)
Platelets: 218 10*3/uL (ref 150–400)
RBC: 5.23 MIL/uL (ref 4.22–5.81)
RDW: 13 % (ref 11.5–15.5)
WBC: 9.5 10*3/uL (ref 4.0–10.5)
nRBC: 0 % (ref 0.0–0.2)

## 2022-07-01 LAB — BASIC METABOLIC PANEL
Anion gap: 9 (ref 5–15)
BUN: 26 mg/dL — ABNORMAL HIGH (ref 8–23)
CO2: 28 mmol/L (ref 22–32)
Calcium: 9.3 mg/dL (ref 8.9–10.3)
Chloride: 103 mmol/L (ref 98–111)
Creatinine, Ser: 1.38 mg/dL — ABNORMAL HIGH (ref 0.61–1.24)
GFR, Estimated: 52 mL/min — ABNORMAL LOW (ref >60)
Glucose, Bld: 100 mg/dL — ABNORMAL HIGH (ref 70–99)
Potassium: 5.3 mmol/L — ABNORMAL HIGH (ref 3.5–5.1)
Sodium: 140 mmol/L (ref 135–145)

## 2022-07-02 LAB — SURGICAL PCR SCREEN
MRSA, PCR: NEGATIVE
Staphylococcus aureus: NEGATIVE

## 2022-07-02 NOTE — H&P (Signed)
TOTAL KNEE ADMISSION H&P  Patient is being admitted for right total knee arthroplasty.  Subjective:  Chief Complaint: Right knee pain.  HPI: Jake Collins, 77 y.o. male has a history of pain and functional disability in the right knee due to arthritis and has failed non-surgical conservative treatments for greater than 12 weeks to include NSAID's and/or analgesics, corticosteriod injections, viscosupplementation injections, and activity modification. Onset of symptoms was gradual, starting several years ago with gradually worsening course since that time. The patient noted no past surgery on the right knee.  Patient currently rates pain in the right knee at 8 out of 10 with activity. Patient has night pain, worsening of pain with activity and weight bearing, and pain that interferes with activities of daily living. Patient has evidence of  bone-on-bone arthritis in the lateral compartment  by imaging studies. There is no active infection.  Patient Active Problem List   Diagnosis Date Noted   OA (osteoarthritis) of hip 04/14/2019   GAD (generalized anxiety disorder) 07/23/2018   Bifascicular block 07/01/2016   Hypertensive heart disease without heart failure 07/01/2016   Aortic dilatation ([place]) 06/30/2016   Glaucoma 05/29/2011   Hypothyroid 05/29/2011   Dyslipidemia 05/29/2011   Depression 05/29/2011   Appendicitis, acute 05/28/2011    Past Medical History:  Diagnosis Date   Arthritis    Cancer ([place])    skin melanoma   Depression    Diverticulitis    2 bouts in 10 years   Hyperlipemia    Hypertension    Hypothyroid    Scoliosis    Thyroid disease     Past Surgical History:  Procedure Laterality Date   APPENDECTOMY     CATARACT EXTRACTION W/ INTRAOCULAR LENS IMPLANT Bilateral    COLONOSCOPY     EYE SURGERY     HERNIA REPAIR     KNEE ARTHROSCOPY Right     x2   KNEE SURGERY Left    LAPAROSCOPIC APPENDECTOMY  05/28/2011   Procedure: APPENDECTOMY LAPAROSCOPIC;  Surgeon:  Edward Jolly, MD;  Location: [facility];  Service: General;  Laterality: N/A;   PAROTID GLAND TUMOR EXCISION     ROOT CANAL     ROTATOR CUFF REPAIR     bil    SALIVARY GLAND SURGERY     L side   SKIN CANCER EXCISION     on his back and was benign   TOTAL HIP ARTHROPLASTY Left 04/14/2019   Procedure: TOTAL HIP ARTHROPLASTY ANTERIOR APPROACH;  Surgeon: Gaynelle Arabian, MD;  Location: [facility];  Service: Orthopedics;  Laterality: Left;  166mn    Prior to Admission medications   Medication Sig Start Date End Date Taking? Authorizing Provider  cholecalciferol (VITAMIN D3) 25 MCG (1000 UT) tablet Take 1,000 Units by mouth daily.   Yes [provider]  Cyanocobalamin (B-12) 5000 MCG CAPS Take 5,000 mcg by mouth daily.   Yes [provider]  EPINEPHrine 0.3 mg/0.3 mL IJ SOAJ injection Inject 0.3 mg into the muscle as needed for anaphylaxis. 01/13/19  Yes [provider]  famotidine (PEPCID) 20 MG tablet Take 20 mg by mouth daily.   Yes [provider]  glucosamine-chondroitin 500-400 MG tablet Take 1 tablet by mouth daily.   Yes [provider]  hydrochlorothiazide (MICROZIDE) 12.5 MG capsule Take 12.5 mg by mouth daily.   Yes [provider]  latanoprost (XALATAN) 0.005 % ophthalmic solution Place 1 drop into both eyes at bedtime.   Yes [provider]  levothyroxine (  SYNTHROID, LEVOTHROID) 100 MCG tablet Take 100 mcg by mouth daily.     Yes [provider]  lithium carbonate 150 MG capsule Take 1 capsule (150 mg total) by mouth at bedtime. 06/20/22  Yes Cottle, Billey Co., MD  loratadine (CLARITIN) 10 MG tablet Take 10 mg by mouth daily as needed for allergies.   Yes [provider]  Magnesium 250 MG TABS Take 250 mg by mouth daily.   Yes [provider]  Melatonin 5 MG CHEW Chew 10 mg by mouth at bedtime. Gummy tabs   Yes [provider]  mupirocin ointment (BACTROBAN) 2 % Apply 1 Application  topically daily. 06/25/22  Yes [provider]  nebivolol (BYSTOLIC) 5 MG tablet Take 5 mg by mouth daily.   Yes [provider]  Omega 3 1200 MG CAPS Take 1,200 mg by mouth daily.   Yes [provider]  OVER THE COUNTER MEDICATION Take 2 tablets by mouth daily. Neuriva Plus   Yes [provider]  pyridOXINE (VITAMIN B-6) 100 MG tablet Take 100 mg by mouth daily.   Yes [provider]  REPATHA SURECLICK 161 MG/ML SOAJ Inject 140 mg into the skin every 14 (fourteen) days. 01/24/20  Yes [provider]  Vilazodone HCl 20 MG TABS Take 1 tablet (20 mg total) by mouth daily at 12 noon. 06/20/22  Yes Cottle, Billey Co., MD    Allergies  Allergen Reactions   Sulfa Antibiotics     Katherina Right syndrome     Social History   Socioeconomic History   Marital status: Married    Spouse name: Not on file   Number of children: Not on file   Years of education: Not on file   Highest education level: Not on file  Occupational History   Not on file  Tobacco Use   Smoking status: Never   Smokeless tobacco: Never  Vaping Use   Vaping Use: Never used  Substance and Sexual Activity   Alcohol use: No   Drug use: No   Sexual activity: Not Currently  Other Topics Concern   Not on file  Social History Narrative   Not on file   Social Determinants of Health   Financial Resource Strain: Not on file  Food Insecurity: Not on file  Transportation Needs: Not on file  Physical Activity: Not on file  Stress: Not on file  Social Connections: Not on file  Intimate Partner Violence: Not on file    Tobacco Use: Low Risk  (07/01/2022)   Patient History    Smoking Tobacco Use: Never    Smokeless Tobacco Use: Never    Passive Exposure: Not on file   Social History   Substance and Sexual Activity  Alcohol Use No    Family History  Problem Relation Age of Onset   Heart disease Father     Review of Systems  Constitutional:  Negative for  chills and fever.  HENT: Negative.    Eyes: Negative.   Respiratory:  Negative for cough and shortness of breath.   Cardiovascular:  Negative for chest pain and palpitations.  Gastrointestinal:  Negative for abdominal pain, constipation, diarrhea, nausea and vomiting.  Genitourinary:  Negative for dysuria, frequency and urgency.  Musculoskeletal:  Positive for joint pain.  Skin:  Negative for rash.   Objective:  Physical Exam: Well nourished and well developed.  General: Alert and oriented x3, cooperative and pleasant, no acute distress.  Head: normocephalic, atraumatic, neck supple.  Eyes: EOMI. Abdomen: non-tender to palpation and soft, normoactive bowel sounds. Musculoskeletal:  Right Knee Exam: Valgus deformity. No effusion present. No swelling present. The range of motion is: 5 to 125 degrees. Positive crepitus on range of motion of the knee. Positive medial joint line tenderness. Positive lateral joint line tenderness. The knee is stable  Calves soft and nontender. Motor function intact in LE. Strength 5/5 LE bilaterally. Neuro: Distal pulses 2+. Sensation to light touch intact in LE.  Vital signs in last 24 hours: Temp:  [97.9 F (36.6 C)] 97.9 F (36.6 C) (01/22 1414) Pulse Rate:  [58] 58 (01/22 1414) Resp:  [20] 20 (01/22 1414) BP: (147)/(89) 147/89 (01/22 1414) SpO2:  [96 %] 96 % (01/22 1414) Weight:  [80.3 kg] 80.3 kg (01/22 1414)  Imaging Review Plain radiographs demonstrate moderate degenerative joint disease of the right knee. The overall alignment is mild valgus. The bone quality appears to be adequate for age and reported activity level.  Assessment/Plan:  End stage arthritis, right knee   The patient history, physical examination, clinical judgment of the provider and imaging studies are consistent with end stage degenerative joint disease of the right knee and total knee arthroplasty is deemed medically necessary. The treatment options including  medical management, injection therapy arthroscopy and arthroplasty were discussed at length. The risks and benefits of total knee arthroplasty were presented and reviewed. The risks due to aseptic loosening, infection, stiffness, patella tracking problems, thromboembolic complications and other imponderables were discussed. The patient acknowledged the explanation, agreed to proceed with the plan and consent was signed. Patient is being admitted for inpatient treatment for surgery, pain control, PT, OT, prophylactic antibiotics, VTE prophylaxis, progressive ambulation and ADLs and discharge planning. The patient is planning to be discharged  home .   Patient's anticipated LOS is less than 2 midnights, meeting these requirements: - Lives within 1 hour of care - Has a competent adult at home to recover with post-op - NO history of  - Chronic pain requiring opiods  - Diabetes  - Coronary Artery Disease  - Heart failure  - Heart attack  - Stroke  - DVT/VTE  - Cardiac arrhythmia  - Respiratory Failure/COPD  - Renal failure  - Anemia  - Advanced Liver disease  Therapy Plans: EO Disposition: Home with Wife Planned DVT Prophylaxis: Aspirin 325 mg BID DME Needed: RW PCP: Donnie Coffin, MD (clearance received) TXA: IV Allergies: sulfa drugs (SJS) Anesthesia Concerns: None BMI: 25.4 Last HgbA1c: not diabetic  [facility] [place]  - Patient was instructed on what medications to stop prior to surgery. - Follow-up visit in 2 weeks with Dr. Wynelle Link - Begin physical therapy following surgery - Pre-operative lab work as pre-surgical testing - Prescriptions will be provided in hospital at time of discharge  R. Jaynie Bream, PA-C Orthopedic Surgery [facility] [place]

## 2022-07-07 NOTE — Anesthesia Preprocedure Evaluation (Signed)
Anesthesia Evaluation  Patient identified by MRN, date of birth, ID band Patient awake    Reviewed: Allergy & Precautions, NPO status , Patient's Chart, lab work & pertinent test results  Airway Mallampati: II  TM Distance: >3 FB Neck ROM: Full    Dental no notable dental hx. (+) Teeth Intact, Dental Advisory Given   Pulmonary    Pulmonary exam normal breath sounds clear to auscultation       Cardiovascular hypertension, Normal cardiovascular exam+ dysrhythmias  Rhythm:Regular Rate:Normal     Neuro/Psych  PSYCHIATRIC DISORDERS Anxiety        GI/Hepatic   Endo/Other  Hypothyroidism    Renal/GU Lab Results      Component                Value               Date                      CREATININE               1.38 (H)            07/01/2022                BUN                      26 (H)              07/01/2022                    K                        5.3 (H)             07/01/2022                     Musculoskeletal  (+) Arthritis , Osteoarthritis,    Abdominal   Peds  Hematology Lab Results      Component                Value               Date                            HGB                      16.3                07/01/2022                      PLT                      218                 07/01/2022              Anesthesia Other Findings JME:QASTM  Reproductive/Obstetrics                             Anesthesia Physical Anesthesia Plan  ASA: 3  Anesthesia Plan: Spinal and Regional   Post-op Pain Management: Regional block* and Minimal or no pain anticipated   Induction:   PONV Risk Score and Plan: Treatment may vary  due to age or medical condition and Propofol infusion  Airway Management Planned: Nasal Cannula and Natural Airway  Additional Equipment: None  Intra-op Plan:   Post-operative Plan:   Informed Consent: I have reviewed the patients History and Physical, chart,  labs and discussed the procedure including the risks, benefits and alternatives for the proposed anesthesia with the patient or authorized representative who has indicated his/her understanding and acceptance.     Dental advisory given  Plan Discussed with: CRNA, Anesthesiologist and Surgeon  Anesthesia Plan Comments: (Spinal w R adductor canal)        Anesthesia Quick Evaluation

## 2022-07-08 ENCOUNTER — Observation Stay (HOSPITAL_COMMUNITY)
Admission: RE | Admit: 2022-07-08 | Discharge: 2022-07-09 | Disposition: A | Discharge status: Home / Self Care | Payer: PPO | Source: Ambulatory Visit | Attending: Orthopedic Surgery | Admitting: Orthopedic Surgery

## 2022-07-08 ENCOUNTER — Encounter (HOSPITAL_COMMUNITY)
Admission: RE | Disposition: A | Discharge status: Home / Self Care | Payer: Self-pay | Source: Ambulatory Visit | Attending: Orthopedic Surgery

## 2022-07-08 ENCOUNTER — Ambulatory Visit (HOSPITAL_BASED_OUTPATIENT_CLINIC_OR_DEPARTMENT_OTHER): Payer: PPO | Admitting: Anesthesiology

## 2022-07-08 ENCOUNTER — Other Ambulatory Visit: Payer: Self-pay

## 2022-07-08 ENCOUNTER — Ambulatory Visit (HOSPITAL_COMMUNITY): Payer: PPO | Admitting: Physician Assistant

## 2022-07-08 ENCOUNTER — Encounter (HOSPITAL_COMMUNITY): Payer: Self-pay | Admitting: Orthopedic Surgery

## 2022-07-08 DIAGNOSIS — E039 Hypothyroidism, unspecified: Secondary | ICD-10-CM | POA: Diagnosis present

## 2022-07-08 DIAGNOSIS — G9341 Metabolic encephalopathy: Secondary | ICD-10-CM | POA: Diagnosis present

## 2022-07-08 DIAGNOSIS — I959 Hypotension, unspecified: Secondary | ICD-10-CM | POA: Diagnosis not present

## 2022-07-08 DIAGNOSIS — K920 Hematemesis: Secondary | ICD-10-CM | POA: Diagnosis not present

## 2022-07-08 DIAGNOSIS — E876 Hypokalemia: Secondary | ICD-10-CM | POA: Diagnosis present

## 2022-07-08 DIAGNOSIS — E785 Hyperlipidemia, unspecified: Secondary | ICD-10-CM | POA: Diagnosis present

## 2022-07-08 DIAGNOSIS — Z79899 Other long term (current) drug therapy: Secondary | ICD-10-CM | POA: Insufficient documentation

## 2022-07-08 DIAGNOSIS — M1711 Unilateral primary osteoarthritis, right knee: Secondary | ICD-10-CM | POA: Diagnosis present

## 2022-07-08 DIAGNOSIS — Z96642 Presence of left artificial hip joint: Secondary | ICD-10-CM | POA: Insufficient documentation

## 2022-07-08 DIAGNOSIS — I10 Essential (primary) hypertension: Secondary | ICD-10-CM | POA: Diagnosis present

## 2022-07-08 DIAGNOSIS — E871 Hypo-osmolality and hyponatremia: Secondary | ICD-10-CM | POA: Diagnosis present

## 2022-07-08 DIAGNOSIS — Z8249 Family history of ischemic heart disease and other diseases of the circulatory system: Secondary | ICD-10-CM | POA: Diagnosis not present

## 2022-07-08 DIAGNOSIS — N1330 Unspecified hydronephrosis: Secondary | ICD-10-CM | POA: Diagnosis present

## 2022-07-08 DIAGNOSIS — R443 Hallucinations, unspecified: Secondary | ICD-10-CM | POA: Diagnosis present

## 2022-07-08 DIAGNOSIS — K5900 Constipation, unspecified: Secondary | ICD-10-CM | POA: Diagnosis not present

## 2022-07-08 DIAGNOSIS — D638 Anemia in other chronic diseases classified elsewhere: Secondary | ICD-10-CM | POA: Diagnosis not present

## 2022-07-08 DIAGNOSIS — M179 Osteoarthritis of knee, unspecified: Secondary | ICD-10-CM | POA: Diagnosis present

## 2022-07-08 DIAGNOSIS — I499 Cardiac arrhythmia, unspecified: Secondary | ICD-10-CM | POA: Diagnosis not present

## 2022-07-08 DIAGNOSIS — Z961 Presence of intraocular lens: Secondary | ICD-10-CM | POA: Diagnosis present

## 2022-07-08 DIAGNOSIS — Z85820 Personal history of malignant melanoma of skin: Secondary | ICD-10-CM | POA: Insufficient documentation

## 2022-07-08 DIAGNOSIS — F419 Anxiety disorder, unspecified: Secondary | ICD-10-CM | POA: Diagnosis not present

## 2022-07-08 DIAGNOSIS — N179 Acute kidney failure, unspecified: Secondary | ICD-10-CM | POA: Diagnosis present

## 2022-07-08 DIAGNOSIS — K209 Esophagitis, unspecified without bleeding: Secondary | ICD-10-CM | POA: Diagnosis not present

## 2022-07-08 DIAGNOSIS — K2101 Gastro-esophageal reflux disease with esophagitis, with bleeding: Secondary | ICD-10-CM | POA: Diagnosis present

## 2022-07-08 DIAGNOSIS — K2100 Gastro-esophageal reflux disease with esophagitis, without bleeding: Secondary | ICD-10-CM | POA: Diagnosis not present

## 2022-07-08 DIAGNOSIS — R410 Disorientation, unspecified: Secondary | ICD-10-CM | POA: Diagnosis not present

## 2022-07-08 DIAGNOSIS — K449 Diaphragmatic hernia without obstruction or gangrene: Secondary | ICD-10-CM | POA: Diagnosis present

## 2022-07-08 DIAGNOSIS — F32A Depression, unspecified: Secondary | ICD-10-CM | POA: Diagnosis present

## 2022-07-08 DIAGNOSIS — Z046 Encounter for general psychiatric examination, requested by authority: Secondary | ICD-10-CM | POA: Diagnosis not present

## 2022-07-08 DIAGNOSIS — D62 Acute posthemorrhagic anemia: Secondary | ICD-10-CM | POA: Diagnosis present

## 2022-07-08 DIAGNOSIS — Z9841 Cataract extraction status, right eye: Secondary | ICD-10-CM | POA: Diagnosis not present

## 2022-07-08 DIAGNOSIS — Z882 Allergy status to sulfonamides status: Secondary | ICD-10-CM | POA: Diagnosis not present

## 2022-07-08 DIAGNOSIS — D500 Iron deficiency anemia secondary to blood loss (chronic): Secondary | ICD-10-CM | POA: Diagnosis not present

## 2022-07-08 DIAGNOSIS — X58XXXA Exposure to other specified factors, initial encounter: Secondary | ICD-10-CM | POA: Diagnosis present

## 2022-07-08 DIAGNOSIS — Z79890 Hormone replacement therapy (postmenopausal): Secondary | ICD-10-CM | POA: Diagnosis not present

## 2022-07-08 DIAGNOSIS — G8918 Other acute postprocedural pain: Secondary | ICD-10-CM | POA: Diagnosis not present

## 2022-07-08 DIAGNOSIS — R58 Hemorrhage, not elsewhere classified: Secondary | ICD-10-CM | POA: Diagnosis not present

## 2022-07-08 DIAGNOSIS — M25761 Osteophyte, right knee: Secondary | ICD-10-CM | POA: Diagnosis present

## 2022-07-08 DIAGNOSIS — K922 Gastrointestinal hemorrhage, unspecified: Secondary | ICD-10-CM | POA: Diagnosis present

## 2022-07-08 DIAGNOSIS — I452 Bifascicular block: Secondary | ICD-10-CM | POA: Diagnosis present

## 2022-07-08 HISTORY — PX: TOTAL KNEE ARTHROPLASTY: SHX125

## 2022-07-08 SURGERY — ARTHROPLASTY, KNEE, TOTAL
Anesthesia: Regional | Site: Knee | Laterality: Right

## 2022-07-08 MED ORDER — CEFAZOLIN SODIUM-DEXTROSE 2-4 GM/100ML-% IV SOLN
2.0000 g | INTRAVENOUS | Status: AC
  Administered 2022-07-08: 2 g via INTRAVENOUS
  Filled 2022-07-08: qty 100

## 2022-07-08 MED ORDER — LACTATED RINGERS IV SOLN: INTRAVENOUS | Status: DC

## 2022-07-08 MED ORDER — ACETAMINOPHEN 10 MG/ML IV SOLN
1000.0000 mg | Freq: Four times a day (QID) | INTRAVENOUS | Status: DC
  Administered 2022-07-08: 1000 mg via INTRAVENOUS
  Filled 2022-07-08: qty 100

## 2022-07-08 MED ORDER — CHLORHEXIDINE GLUCONATE 0.12 % MT SOLN
15.0000 mL | Freq: Once | OROMUCOSAL | Status: AC
  Administered 2022-07-08: 15 mL via OROMUCOSAL

## 2022-07-08 MED ORDER — SODIUM CHLORIDE 0.9 % IV SOLN: INTRAVENOUS | Status: DC

## 2022-07-08 MED ORDER — ASPIRIN 325 MG PO TBEC
325.0000 mg | DELAYED_RELEASE_TABLET | Freq: Two times a day (BID) | ORAL | Status: DC
  Administered 2022-07-08 – 2022-07-09 (×2): 325 mg via ORAL
  Filled 2022-07-08 (×2): qty 1

## 2022-07-08 MED ORDER — PHENYLEPHRINE HCL (PRESSORS) 10 MG/ML IV SOLN
INTRAVENOUS | Status: AC
  Filled 2022-07-08: qty 1

## 2022-07-08 MED ORDER — POLYETHYLENE GLYCOL 3350 17 G PO PACK: 17.0000 g | PACK | Freq: Every day | ORAL | Status: DC | PRN

## 2022-07-08 MED ORDER — EPHEDRINE 5 MG/ML INJ
INTRAVENOUS | Status: AC
  Filled 2022-07-08: qty 5

## 2022-07-08 MED ORDER — DEXAMETHASONE SODIUM PHOSPHATE 10 MG/ML IJ SOLN: 8.0000 mg | Freq: Once | INTRAMUSCULAR | Status: DC

## 2022-07-08 MED ORDER — ONDANSETRON HCL 4 MG PO TABS: 4.0000 mg | ORAL_TABLET | Freq: Four times a day (QID) | ORAL | Status: DC | PRN

## 2022-07-08 MED ORDER — ACETAMINOPHEN 10 MG/ML IV SOLN: 1000.0000 mg | Freq: Once | INTRAVENOUS | Status: DC | PRN

## 2022-07-08 MED ORDER — FAMOTIDINE 20 MG PO TABS
20.0000 mg | ORAL_TABLET | Freq: Every day | ORAL | Status: DC
  Administered 2022-07-09: 20 mg via ORAL
  Filled 2022-07-08: qty 1

## 2022-07-08 MED ORDER — PROPOFOL 500 MG/50ML IV EMUL
INTRAVENOUS | Status: DC | PRN
  Administered 2022-07-08: 50 ug/kg/min via INTRAVENOUS

## 2022-07-08 MED ORDER — SODIUM CHLORIDE 0.9 % IR SOLN
Status: DC | PRN
  Administered 2022-07-08 (×2): 1000 mL

## 2022-07-08 MED ORDER — BUPIVACAINE IN DEXTROSE 0.75-8.25 % IT SOLN
INTRATHECAL | Status: DC | PRN
  Administered 2022-07-08: 1.7 mL via INTRATHECAL

## 2022-07-08 MED ORDER — FLEET ENEMA 7-19 GM/118ML RE ENEM: 1.0000 | ENEMA | Freq: Once | RECTAL | Status: DC | PRN

## 2022-07-08 MED ORDER — CEFAZOLIN SODIUM-DEXTROSE 2-4 GM/100ML-% IV SOLN
2.0000 g | Freq: Four times a day (QID) | INTRAVENOUS | Status: AC
  Administered 2022-07-08 (×2): 2 g via INTRAVENOUS
  Filled 2022-07-08 (×2): qty 100

## 2022-07-08 MED ORDER — METHOCARBAMOL 500 MG IVPB - SIMPLE MED: 500.0000 mg | Freq: Four times a day (QID) | INTRAVENOUS | Status: DC | PRN

## 2022-07-08 MED ORDER — BUPIVACAINE LIPOSOME 1.3 % IJ SUSP
INTRAMUSCULAR | Status: DC | PRN
  Administered 2022-07-08: 20 mL

## 2022-07-08 MED ORDER — SODIUM CHLORIDE (PF) 0.9 % IJ SOLN
INTRAMUSCULAR | Status: DC | PRN
  Administered 2022-07-08: 60 mL

## 2022-07-08 MED ORDER — DIPHENHYDRAMINE HCL 12.5 MG/5ML PO ELIX: 12.5000 mg | ORAL_SOLUTION | ORAL | Status: DC | PRN

## 2022-07-08 MED ORDER — LORATADINE 10 MG PO TABS: 10.0000 mg | ORAL_TABLET | Freq: Every day | ORAL | Status: DC | PRN

## 2022-07-08 MED ORDER — VILAZODONE HCL 20 MG PO TABS
20.0000 mg | ORAL_TABLET | Freq: Every day | ORAL | Status: DC
  Administered 2022-07-09: 20 mg via ORAL
  Filled 2022-07-08: qty 1

## 2022-07-08 MED ORDER — OXYCODONE HCL 5 MG PO TABS
10.0000 mg | ORAL_TABLET | ORAL | Status: DC | PRN
  Administered 2022-07-08: 15 mg via ORAL
  Filled 2022-07-08: qty 3

## 2022-07-08 MED ORDER — MENTHOL 3 MG MT LOZG: 1.0000 | LOZENGE | OROMUCOSAL | Status: DC | PRN

## 2022-07-08 MED ORDER — PHENYLEPHRINE HCL-NACL 20-0.9 MG/250ML-% IV SOLN
INTRAVENOUS | Status: DC | PRN
  Administered 2022-07-08: 50 ug/min via INTRAVENOUS

## 2022-07-08 MED ORDER — LITHIUM CARBONATE 150 MG PO CAPS
150.0000 mg | ORAL_CAPSULE | Freq: Every day | ORAL | Status: DC
  Administered 2022-07-08: 150 mg via ORAL
  Filled 2022-07-08: qty 1

## 2022-07-08 MED ORDER — BUPIVACAINE LIPOSOME 1.3 % IJ SUSP: 20.0000 mL | Freq: Once | INTRAMUSCULAR | Status: DC

## 2022-07-08 MED ORDER — LATANOPROST 0.005 % OP SOLN
1.0000 [drp] | Freq: Every day | OPHTHALMIC | Status: DC
  Administered 2022-07-08: 1 [drp] via OPHTHALMIC
  Filled 2022-07-08: qty 2.5

## 2022-07-08 MED ORDER — MORPHINE SULFATE (PF) 2 MG/ML IV SOLN: 1.0000 mg | INTRAVENOUS | Status: DC | PRN

## 2022-07-08 MED ORDER — BISACODYL 10 MG RE SUPP: 10.0000 mg | Freq: Every day | RECTAL | Status: DC | PRN

## 2022-07-08 MED ORDER — ONDANSETRON HCL 4 MG/2ML IJ SOLN: 4.0000 mg | Freq: Once | INTRAMUSCULAR | Status: DC | PRN

## 2022-07-08 MED ORDER — ACETAMINOPHEN 500 MG PO TABS
1000.0000 mg | ORAL_TABLET | Freq: Four times a day (QID) | ORAL | Status: AC
  Administered 2022-07-08 – 2022-07-09 (×4): 1000 mg via ORAL
  Filled 2022-07-08 (×4): qty 2

## 2022-07-08 MED ORDER — ONDANSETRON HCL 4 MG/2ML IJ SOLN
INTRAMUSCULAR | Status: AC
  Filled 2022-07-08: qty 2

## 2022-07-08 MED ORDER — HYDROCHLOROTHIAZIDE 12.5 MG PO TABS
12.5000 mg | ORAL_TABLET | Freq: Every day | ORAL | Status: DC
  Administered 2022-07-09: 12.5 mg via ORAL
  Filled 2022-07-08: qty 1

## 2022-07-08 MED ORDER — LEVOTHYROXINE SODIUM 100 MCG PO TABS
100.0000 ug | ORAL_TABLET | Freq: Every day | ORAL | Status: DC
  Administered 2022-07-09: 100 ug via ORAL
  Filled 2022-07-08: qty 1

## 2022-07-08 MED ORDER — TRANEXAMIC ACID-NACL 1000-0.7 MG/100ML-% IV SOLN
1000.0000 mg | INTRAVENOUS | Status: AC
  Administered 2022-07-08: 1000 mg via INTRAVENOUS
  Filled 2022-07-08: qty 100

## 2022-07-08 MED ORDER — METHOCARBAMOL 500 MG PO TABS
500.0000 mg | ORAL_TABLET | Freq: Four times a day (QID) | ORAL | Status: DC | PRN
  Administered 2022-07-08 – 2022-07-09 (×3): 500 mg via ORAL
  Filled 2022-07-08 (×3): qty 1

## 2022-07-08 MED ORDER — PHENOL 1.4 % MT LIQD: 1.0000 | OROMUCOSAL | Status: DC | PRN

## 2022-07-08 MED ORDER — NEBIVOLOL HCL 5 MG PO TABS
5.0000 mg | ORAL_TABLET | Freq: Every day | ORAL | Status: DC
  Administered 2022-07-09: 5 mg via ORAL
  Filled 2022-07-08: qty 1

## 2022-07-08 MED ORDER — DEXAMETHASONE SODIUM PHOSPHATE 10 MG/ML IJ SOLN
INTRAMUSCULAR | Status: AC
  Filled 2022-07-08: qty 1

## 2022-07-08 MED ORDER — PROPOFOL 1000 MG/100ML IV EMUL
INTRAVENOUS | Status: AC
  Filled 2022-07-08: qty 200

## 2022-07-08 MED ORDER — ONDANSETRON HCL 4 MG/2ML IJ SOLN
INTRAMUSCULAR | Status: DC | PRN
  Administered 2022-07-08: 4 mg via INTRAVENOUS

## 2022-07-08 MED ORDER — METOCLOPRAMIDE HCL 5 MG PO TABS: 5.0000 mg | ORAL_TABLET | Freq: Three times a day (TID) | ORAL | Status: DC | PRN

## 2022-07-08 MED ORDER — EPHEDRINE SULFATE-NACL 50-0.9 MG/10ML-% IV SOSY
PREFILLED_SYRINGE | INTRAVENOUS | Status: DC | PRN
  Administered 2022-07-08 (×2): 10 mg via INTRAVENOUS
  Administered 2022-07-08: 5 mg via INTRAVENOUS

## 2022-07-08 MED ORDER — DEXAMETHASONE SODIUM PHOSPHATE 10 MG/ML IJ SOLN
10.0000 mg | Freq: Once | INTRAMUSCULAR | Status: AC
  Administered 2022-07-09: 10 mg via INTRAVENOUS
  Filled 2022-07-08: qty 1

## 2022-07-08 MED ORDER — ORAL CARE MOUTH RINSE: 15.0000 mL | Freq: Once | OROMUCOSAL | Status: AC

## 2022-07-08 MED ORDER — DOCUSATE SODIUM 100 MG PO CAPS
100.0000 mg | ORAL_CAPSULE | Freq: Two times a day (BID) | ORAL | Status: DC
  Administered 2022-07-08 – 2022-07-09 (×2): 100 mg via ORAL
  Filled 2022-07-08 (×2): qty 1

## 2022-07-08 MED ORDER — FENTANYL CITRATE PF 50 MCG/ML IJ SOSY: 25.0000 ug | PREFILLED_SYRINGE | INTRAMUSCULAR | Status: DC | PRN

## 2022-07-08 MED ORDER — OXYCODONE HCL 5 MG PO TABS
5.0000 mg | ORAL_TABLET | ORAL | Status: DC | PRN
  Administered 2022-07-08 – 2022-07-09 (×2): 10 mg via ORAL
  Filled 2022-07-08 (×2): qty 2

## 2022-07-08 MED ORDER — METOCLOPRAMIDE HCL 5 MG/ML IJ SOLN: 5.0000 mg | Freq: Three times a day (TID) | INTRAMUSCULAR | Status: DC | PRN

## 2022-07-08 MED ORDER — FENTANYL CITRATE PF 50 MCG/ML IJ SOSY
50.0000 ug | PREFILLED_SYRINGE | INTRAMUSCULAR | Status: DC
  Administered 2022-07-08: 50 ug via INTRAVENOUS
  Filled 2022-07-08: qty 2

## 2022-07-08 MED ORDER — CLONIDINE HCL (ANALGESIA) 100 MCG/ML EP SOLN
EPIDURAL | Status: DC | PRN
  Administered 2022-07-08: 100 ug

## 2022-07-08 MED ORDER — ROPIVACAINE HCL 5 MG/ML IJ SOLN
INTRAMUSCULAR | Status: DC | PRN
  Administered 2022-07-08: 30 mL via PERINEURAL

## 2022-07-08 MED ORDER — PROPOFOL 10 MG/ML IV BOLUS
INTRAVENOUS | Status: DC | PRN
  Administered 2022-07-08: 30 mg via INTRAVENOUS
  Administered 2022-07-08: 40 mg via INTRAVENOUS

## 2022-07-08 MED ORDER — DEXAMETHASONE SODIUM PHOSPHATE 10 MG/ML IJ SOLN
INTRAMUSCULAR | Status: DC | PRN
  Administered 2022-07-08: 8 mg via INTRAVENOUS

## 2022-07-08 MED ORDER — POVIDONE-IODINE 10 % EX SWAB: 2.0000 | Freq: Once | CUTANEOUS | Status: DC

## 2022-07-08 MED ORDER — ONDANSETRON HCL 4 MG/2ML IJ SOLN: 4.0000 mg | Freq: Four times a day (QID) | INTRAMUSCULAR | Status: DC | PRN

## 2022-07-08 SURGICAL SUPPLY — 58 items
ATTUNE MED DOME PAT 41 KNEE (Knees) IMPLANT
ATTUNE PS FEM RT SZ 6 CEM KNEE (Femur) IMPLANT
ATTUNE PSRP INSR SZ6 8 KNEE (Insert) IMPLANT
BAG COUNTER SPONGE SURGICOUNT (BAG) IMPLANT
BAG SPEC THK2 15X12 ZIP CLS (MISCELLANEOUS) ×1
BAG SPNG CNTER NS LX DISP (BAG) ×1
BAG ZIPLOCK 12X15 (MISCELLANEOUS) ×1 IMPLANT
BASE TIBIAL ROT PLAT SZ 8 KNEE (Knees) IMPLANT
BLADE SAG 18X100X1.27 (BLADE) ×1 IMPLANT
BLADE SAW SGTL 11.0X1.19X90.0M (BLADE) ×1 IMPLANT
BNDG ELASTIC 6X5.8 VLCR STR LF (GAUZE/BANDAGES/DRESSINGS) ×1 IMPLANT
BOWL SMART MIX CTS (DISPOSABLE) ×1 IMPLANT
BSPLAT TIB 8 CMNT ROT PLAT STR (Knees) ×1 IMPLANT
CATH FOLEY 2WAY SLVR  5CC 12FR (CATHETERS) ×1
CATH FOLEY 2WAY SLVR 5CC 12FR (CATHETERS) IMPLANT
CEMENT HV SMART SET (Cement) ×2 IMPLANT
COVER SURGICAL LIGHT HANDLE (MISCELLANEOUS) ×1 IMPLANT
CUFF TOURN SGL QUICK 34 (TOURNIQUET CUFF) ×1
CUFF TRNQT CYL 34X4.125X (TOURNIQUET CUFF) ×1 IMPLANT
DRAPE INCISE IOBAN 66X45 STRL (DRAPES) ×1 IMPLANT
DRAPE U-SHAPE 47X51 STRL (DRAPES) ×1 IMPLANT
DRSG AQUACEL AG ADV 3.5X10 (GAUZE/BANDAGES/DRESSINGS) ×1 IMPLANT
DURAPREP 26ML APPLICATOR (WOUND CARE) ×1 IMPLANT
ELECT REM PT RETURN 15FT ADLT (MISCELLANEOUS) ×1 IMPLANT
GLOVE BIO SURGEON STRL SZ 6.5 (GLOVE) IMPLANT
GLOVE BIO SURGEON STRL SZ7.5 (GLOVE) IMPLANT
GLOVE BIO SURGEON STRL SZ8 (GLOVE) ×1 IMPLANT
GLOVE BIOGEL PI IND STRL 6.5 (GLOVE) IMPLANT
GLOVE BIOGEL PI IND STRL 7.0 (GLOVE) IMPLANT
GLOVE BIOGEL PI IND STRL 8 (GLOVE) ×1 IMPLANT
GOWN STRL REUS W/ TWL LRG LVL3 (GOWN DISPOSABLE) ×1 IMPLANT
GOWN STRL REUS W/ TWL XL LVL3 (GOWN DISPOSABLE) IMPLANT
GOWN STRL REUS W/TWL LRG LVL3 (GOWN DISPOSABLE) ×1
GOWN STRL REUS W/TWL XL LVL3 (GOWN DISPOSABLE)
HANDPIECE INTERPULSE COAX TIP (DISPOSABLE) ×1
HOLDER FOLEY CATH W/STRAP (MISCELLANEOUS) IMPLANT
IMMOBILIZER KNEE 20 (SOFTGOODS) ×1
IMMOBILIZER KNEE 20 THIGH 36 (SOFTGOODS) ×1 IMPLANT
KIT TURNOVER KIT A (KITS) IMPLANT
MANIFOLD NEPTUNE II (INSTRUMENTS) ×1 IMPLANT
NS IRRIG 1000ML POUR BTL (IV SOLUTION) ×1 IMPLANT
PACK TOTAL KNEE CUSTOM (KITS) ×1 IMPLANT
PADDING CAST COTTON 6X4 STRL (CAST SUPPLIES) ×2 IMPLANT
PIN STEINMAN FIXATION KNEE (PIN) IMPLANT
PROTECTOR NERVE ULNAR (MISCELLANEOUS) ×1 IMPLANT
SET HNDPC FAN SPRY TIP SCT (DISPOSABLE) ×1 IMPLANT
SPIKE FLUID TRANSFER (MISCELLANEOUS) ×1 IMPLANT
STRIP CLOSURE SKIN 1/2X4 (GAUZE/BANDAGES/DRESSINGS) ×2 IMPLANT
SUT MNCRL AB 4-0 PS2 18 (SUTURE) ×1 IMPLANT
SUT STRATAFIX 0 PDS 27 VIOLET (SUTURE) ×1
SUT VIC AB 2-0 CT1 27 (SUTURE) ×3
SUT VIC AB 2-0 CT1 TAPERPNT 27 (SUTURE) ×3 IMPLANT
SUTURE STRATFX 0 PDS 27 VIOLET (SUTURE) ×1 IMPLANT
TIBIAL BASE ROT PLAT SZ 8 KNEE (Knees) ×1 IMPLANT
TRAY FOLEY MTR SLVR 16FR STAT (SET/KITS/TRAYS/PACK) ×1 IMPLANT
TUBE SUCTION HIGH CAP CLEAR NV (SUCTIONS) ×1 IMPLANT
WATER STERILE IRR 1000ML POUR (IV SOLUTION) ×2 IMPLANT
WRAP KNEE MAXI GEL POST OP (GAUZE/BANDAGES/DRESSINGS) ×1 IMPLANT

## 2022-07-08 NOTE — Discharge Instructions (Signed)
 Frank Aluisio, MD Total Joint Specialist [facility] [address] [place], [place] [address] [phone]  TOTAL KNEE REPLACEMENT POSTOPERATIVE DIRECTIONS    Knee Rehabilitation, Guidelines Following Surgery  Results after knee surgery are often greatly improved when you follow the exercise, range of motion and muscle strengthening exercises prescribed by your doctor. Safety measures are also important to protect the knee from further injury. If any of these exercises cause you to have increased pain or swelling in your knee joint, decrease the amount until you are comfortable again and slowly increase them. If you have problems or questions, call your caregiver or physical therapist for advice.   BLOOD CLOT PREVENTION Take a 325 mg Aspirin two times a day for three weeks following surgery. Then take an 81 mg Aspirin once a day for three weeks. Then discontinue Aspirin. You may resume your vitamins/supplements upon discharge from the hospital. Do not take any NSAIDs (Advil, Aleve, Ibuprofen, Meloxicam, etc.) until you have discontinued the 325 mg Aspirin.  HOME CARE INSTRUCTIONS  Remove items at home which could result in a fall. This includes throw rugs or furniture in walking pathways.  ICE to the affected knee as much as tolerated. Icing helps control swelling. If the swelling is well controlled you will be more comfortable and rehab easier. Continue to use ice on the knee for pain and swelling from surgery. You may notice swelling that will progress down to the foot and ankle. This is normal after surgery. Elevate the leg when you are not up walking on it.    Continue to use the breathing machine which will help keep your temperature down. It is common for your temperature to cycle up and down following surgery, especially at night when you are not up moving around and exerting yourself. The breathing machine keeps your lungs expanded and your temperature  down. Do not place pillow under the operative knee, focus on keeping the knee straight while resting  DIET You may resume your previous home diet once you are discharged from the hospital.  DRESSING / WOUND CARE / SHOWERING Keep your bulky bandage on for 2 days. On the third post-operative day you may remove the Ace bandage and gauze. There is a waterproof adhesive bandage on your skin which will stay in place until your first follow-up appointment. Once you remove this you will not need to place another bandage You may begin showering 3 days following surgery, but do not submerge the incision under water.  ACTIVITY For the first 5 days, the key is rest and control of pain and swelling Do your home exercises twice a day starting on post-operative day 3. On the days you go to physical therapy, just do the home exercises once that day. You should rest, ice and elevate the leg for 50 minutes out of every hour. Get up and walk/stretch for 10 minutes per hour. After 5 days you can increase your activity slowly as tolerated. Walk with your walker as instructed. Use the walker until you are comfortable transitioning to a cane. Walk with the cane in the opposite hand of the operative leg. You may discontinue the cane once you are comfortable and walking steadily. Avoid periods of inactivity such as sitting longer than an hour when not asleep. This helps prevent blood clots.  You may discontinue the knee immobilizer once you are able to perform a straight leg raise while lying down. You may resume a sexual relationship in one month   or when given the OK by your doctor.  You may return to work once you are cleared by your doctor.  Do not drive a car for 6 weeks or until released by your surgeon.  Do not drive while taking narcotics.  TED HOSE STOCKINGS Wear the elastic stockings on both legs for three weeks following surgery during the day. You may remove them at night for sleeping.  WEIGHT  BEARING Weight bearing as tolerated with assist device (walker, cane, etc) as directed, use it as long as suggested by your surgeon or therapist, typically at least 4-6 weeks.  POSTOPERATIVE CONSTIPATION PROTOCOL Constipation - defined medically as fewer than three stools per week and severe constipation as less than one stool per week.  One of the most common issues patients have following surgery is constipation.  Even if you have a regular bowel pattern at home, your normal regimen is likely to be disrupted due to multiple reasons following surgery.  Combination of anesthesia, postoperative narcotics, change in appetite and fluid intake all can affect your bowels.  In order to avoid complications following surgery, here are some recommendations in order to help you during your recovery period.  Colace (docusate) - Pick up an over-the-counter form of Colace or another stool softener and take twice a day as long as you are requiring postoperative pain medications.  Take with a full glass of water daily.  If you experience loose stools or diarrhea, hold the colace until you stool forms back up. If your symptoms do not get better within 1 week or if they get worse, check with your doctor. Dulcolax (bisacodyl) - Pick up over-the-counter and take as directed by the product packaging as needed to assist with the movement of your bowels.  Take with a full glass of water.  Use this product as needed if not relieved by Colace only.  MiraLax (polyethylene glycol) - Pick up over-the-counter to have on hand. MiraLax is a solution that will increase the amount of water in your bowels to assist with bowel movements.  Take as directed and can mix with a glass of water, juice, soda, coffee, or tea. Take if you go more than two days without a movement. Do not use MiraLax more than once per day. Call your doctor if you are still constipated or irregular after using this medication for 7 days in a row.  If you continue  to have problems with postoperative constipation, please contact the office for further assistance and recommendations.  If you experience "the worst abdominal pain ever" or develop nausea or vomiting, please contact the office immediatly for further recommendations for treatment.  ITCHING If you experience itching with your medications, try taking only a single pain pill, or even half a pain pill at a time.  You can also use Benadryl over the counter for itching or also to help with sleep.   MEDICATIONS See your medication summary on the "After Visit Summary" that the nursing staff will review with you prior to discharge.  You may have some home medications which will be placed on hold until you complete the course of blood thinner medication.  It is important for you to complete the blood thinner medication as prescribed by your surgeon.  Continue your approved medications as instructed at time of discharge.  PRECAUTIONS If you experience chest pain or shortness of breath - call 911 immediately for transfer to the hospital emergency department.  If you develop a fever greater that 101 F,   purulent drainage from wound, increased redness or drainage from wound, foul odor from the wound/dressing, or calf pain - CONTACT YOUR SURGEON.                                                   FOLLOW-UP APPOINTMENTS Make sure you keep all of your appointments after your operation with your surgeon and caregivers. You should call the office at the above phone number and make an appointment for approximately two weeks after the date of your surgery or on the date instructed by your surgeon outlined in the "After Visit Summary".  RANGE OF MOTION AND STRENGTHENING EXERCISES  Rehabilitation of the knee is important following a knee injury or an operation. After just a few days of immobilization, the muscles of the thigh which control the knee become weakened and shrink (atrophy). Knee exercises are designed to build up  the tone and strength of the thigh muscles and to improve knee motion. Often times heat used for twenty to thirty minutes before working out will loosen up your tissues and help with improving the range of motion but do not use heat for the first two weeks following surgery. These exercises can be done on a training (exercise) mat, on the floor, on a table or on a bed. Use what ever works the best and is most comfortable for you Knee exercises include:  Leg Lifts - While your knee is still immobilized in a splint or cast, you can do straight leg raises. Lift the leg to 60 degrees, hold for 3 sec, and slowly lower the leg. Repeat 10-20 times 2-3 times daily. Perform this exercise against resistance later as your knee gets better.  Quad and Hamstring Sets - Tighten up the muscle on the front of the thigh (Quad) and hold for 5-10 sec. Repeat this 10-20 times hourly. Hamstring sets are done by pushing the foot backward against an object and holding for 5-10 sec. Repeat as with quad sets.  Leg Slides: Lying on your back, slowly slide your foot toward your buttocks, bending your knee up off the floor (only go as far as is comfortable). Then slowly slide your foot back down until your leg is flat on the floor again. Angel Wings: Lying on your back spread your legs to the side as far apart as you can without causing discomfort.  A rehabilitation program following serious knee injuries can speed recovery and prevent re-injury in the future due to weakened muscles. Contact your doctor or a physical therapist for more information on knee rehabilitation.   POST-OPERATIVE OPIOID TAPER INSTRUCTIONS: It is important to wean off of your opioid medication as soon as possible. If you do not need pain medication after your surgery it is ok to stop day one. Opioids include: Codeine, Hydrocodone(Norco, Vicodin), Oxycodone(Percocet, oxycontin) and hydromorphone amongst others.  Long term and even short term use of opiods can  cause: Increased pain response Dependence Constipation Depression Respiratory depression And more.  Withdrawal symptoms can include Flu like symptoms Nausea, vomiting And more Techniques to manage these symptoms Hydrate well Eat regular healthy meals Stay active Use relaxation techniques(deep breathing, meditating, yoga) Do Not substitute Alcohol to help with tapering If you have been on opioids for less than two weeks and do not have pain than it is ok to stop all together.  Plan   to wean off of opioids This plan should start within one week post op of your joint replacement. Maintain the same interval or time between taking each dose and first decrease the dose.  Cut the total daily intake of opioids by one tablet each day Next start to increase the time between doses. The last dose that should be eliminated is the evening dose.   IF YOU ARE TRANSFERRED TO A SKILLED REHAB FACILITY If the patient is transferred to a skilled rehab facility following release from the hospital, a list of the current medications will be sent to the facility for the patient to continue.  When discharged from the skilled rehab facility, please have the facility set up the patient's Home Health Physical Therapy prior to being released. Also, the skilled facility will be responsible for providing the patient with their medications at time of release from the facility to include their pain medication, the muscle relaxants, and their blood thinner medication. If the patient is still at the rehab facility at time of the two week follow up appointment, the skilled rehab facility will also need to assist the patient in arranging follow up appointment in our office and any transportation needs.  MAKE SURE YOU:  Understand these instructions.  Get help right away if you are not doing well or get worse.   DENTAL ANTIBIOTICS:  In most cases prophylactic antibiotics for Dental procdeures after total joint surgery are  not necessary.  Exceptions are as follows:  1. History of prior total joint infection  2. Severely immunocompromised (Organ Transplant, cancer chemotherapy, Rheumatoid biologic meds such as Humera)  3. Poorly controlled diabetes (A1C &gt; 8.0, blood glucose over 200)  If you have one of these conditions, contact your surgeon for an antibiotic prescription, prior to your dental procedure.    Pick up stool softner and laxative for home use following surgery while on pain medications. Do not submerge incision under water. Please use good hand washing techniques while changing dressing each day. May shower starting three days after surgery. Please use a clean towel to pat the incision dry following showers. Continue to use ice for pain and swelling after surgery. Do not use any lotions or creams on the incision until instructed by your surgeon.  

## 2022-07-08 NOTE — Progress Notes (Signed)
Orthopedic Tech Progress Note Patient Details:  Jake Collins 09/14/1945 [id number]  CPM Right Knee CPM Right Knee: Off Right Knee Flexion (Degrees): 40 Right Knee Extension (Degrees): 10  Post Interventions Patient Tolerated: Well  [name] 07/08/2022, 4:38 PM

## 2022-07-08 NOTE — Plan of Care (Signed)
Problem: Education: Goal: Knowledge of the prescribed therapeutic regimen will improve Outcome: Progressing   Problem: Activity: Goal: Ability to avoid complications of mobility impairment will improve Outcome: Progressing   Problem: Clinical Measurements: Goal: Postoperative complications will be avoided or minimized Outcome: Progressing   Problem: Pain Management: Goal: Pain level will decrease with appropriate interventions Outcome: [place], RN 07/08/22 8:50 PM

## 2022-07-08 NOTE — Progress Notes (Signed)
Orthopedic Tech Progress Note Patient Details:  Jake Collins March 31, 1946 [id number]  CPM Right Knee CPM Right Knee: On Right Knee Flexion (Degrees): 40 Right Knee Extension (Degrees): 10  Post Interventions Patient Tolerated: Well  Linus Salmons Kileigh Ortmann 07/08/2022, 12:32 PM

## 2022-07-08 NOTE — Op Note (Signed)
OPERATIVE REPORT-TOTAL KNEE ARTHROPLASTY   Pre-operative diagnosis- Osteoarthritis  Right knee(s)  Post-operative diagnosis- Osteoarthritis Right knee(s)  Procedure-  Right  Total Knee Arthroplasty  Surgeon- Dione Plover. Tyshana Nishida, MD  Assistant- Shearon Balo, PA-C   Anesthesia-   Adductor canal block and spinal  EBL- 25 ml   Drains None  Tourniquet time-  Total Tourniquet Time Documented: Thigh (Right) - 38 minutes Total: Thigh (Right) - 38 minutes     Complications- None  Condition-PACU - hemodynamically stable.   Brief Clinical Note  Jake Collins is a 77 y.o. year old male with end stage OA of his right knee with progressively worsening pain and dysfunction. He has constant pain, with activity and at rest and significant functional deficits with difficulties even with ADLs. He has had extensive non-op management including analgesics, injections of cortisone and viscosupplements, and home exercise program, but remains in significant pain with significant dysfunction. Radiographs show bone on bone arthritis lateral and patellofemoral. He presents now for right Total Knee Arthroplasty.     Procedure in detail---   The patient is brought into the operating room and positioned supine on the operating table. After successful administration of  adductor canal block and spinal,   a tourniquet is placed high on the  Right thigh(s) and the lower extremity is prepped and draped in the usual sterile fashion. Time out is performed by the operating team and then the  Right lower extremity is wrapped in Esmarch, knee flexed and the tourniquet inflated to 300 mmHg.       A midline incision is made with a ten blade through the subcutaneous tissue to the level of the extensor mechanism. A fresh blade is used to make a medial parapatellar arthrotomy. Soft tissue over the proximal medial tibia is subperiosteally elevated to the joint line with a knife and into the semimembranosus bursa with a  Cobb elevator. Soft tissue over the proximal lateral tibia is elevated with attention being paid to avoiding the patellar tendon on the tibial tubercle. The patella is everted, knee flexed 90 degrees and the ACL and PCL are removed. Findings are bone on bone lateral and exposed bone trochlea with large lateral osteophytes        The drill is used to create a starting hole in the distal femur and the canal is thoroughly irrigated with sterile saline to remove the fatty contents. The 5 degree Right  valgus alignment guide is placed into the femoral canal and the distal femoral cutting block is pinned to remove 9 mm off the distal femur. Resection is made with an oscillating saw.      The tibia is subluxed forward and the menisci are removed. The extramedullary alignment guide is placed referencing proximally at the medial aspect of the tibial tubercle and distally along the second metatarsal axis and tibial crest. The block is pinned to remove 64m off the more deficient lateral  side. Resection is made with an oscillating saw. Size 8is the most appropriate size for the tibia and the proximal tibia is prepared with the modular drill and keel punch for that size.      The femoral sizing guide is placed and size 6 is most appropriate. Rotation is marked off the epicondylar axis and confirmed by creating a rectangular flexion gap at 90 degrees. The size 6 cutting block is pinned in this rotation and the anterior, posterior and chamfer cuts are made with the oscillating saw. The intercondylar block is then placed and  that cut is made.      Trial size 8 tibial component, trial size 6 posterior stabilized femur and a 8  mm posterior stabilized rotating platform insert trial is placed. Full extension is achieved with excellent varus/valgus and anterior/posterior balance throughout full range of motion. The patella is everted and thickness measured to be 27  mm. Free hand resection is taken to 15 mm, a 41 template is  placed, lug holes are drilled, trial patella is placed, and it tracks normally. Osteophytes are removed off the posterior femur with the trial in place. All trials are removed and the cut bone surfaces prepared with pulsatile lavage. Cement is mixed and once ready for implantation, the size 8 tibial implant, size  6 posterior stabilized femoral component, and the size 41 patella are cemented in place and the patella is held with the clamp. The trial insert is placed and the knee held in full extension. The Exparel (20 ml mixed with 60 ml saline) is injected into the extensor mechanism, posterior capsule, medial and lateral gutters and subcutaneous tissues.  All extruded cement is removed and once the cement is hard the permanent 8 mm posterior stabilized rotating platform insert is placed into the tibial tray.      The wound is copiously irrigated with saline solution and the extensor mechanism closed with # 0 Stratofix suture. The tourniquet is released for a total tourniquet time of 37  minutes. Flexion against gravity is 140 degrees and the patella tracks normally. Subcutaneous tissue is closed with 2.0 vicryl and subcuticular with running 4.0 Monocryl. The incision is cleaned and dried and steri-strips and a bulky sterile dressing are applied. The limb is placed into a knee immobilizer and the patient is awakened and transported to recovery in stable condition.      Please note that a surgical assistant was a medical necessity for this procedure in order to perform it in a safe and expeditious manner. Surgical assistant was necessary to retract the ligaments and vital neurovascular structures to prevent injury to them and also necessary for proper positioning of the limb to allow for anatomic placement of the prosthesis.   Dione Plover Marice Angelino, MD    07/08/2022, 11:46 AM

## 2022-07-08 NOTE — Anesthesia Postprocedure Evaluation (Signed)
Anesthesia Post Note  Patient: Jake Collins  Procedure(s) Performed: TOTAL KNEE ARTHROPLASTY (Right: Knee)     Patient location during evaluation: Nursing Unit Anesthesia Type: Regional Level of consciousness: oriented and awake and alert Pain management: pain level controlled Vital Signs Assessment: post-procedure vital signs reviewed and stable Respiratory status: spontaneous breathing and respiratory function stable Cardiovascular status: blood pressure returned to baseline and stable Postop Assessment: no headache, no backache, no apparent nausea or vomiting and patient able to bend at knees Anesthetic complications: no  No notable events documented.  Last Vitals:  Vitals:   07/08/22 1300 07/08/22 1315  BP:    Pulse: 69 68  Resp: 14 12  Temp:  36.5 C  SpO2: 93% 95%    Last Pain:  Vitals:   07/08/22 1300  TempSrc:   PainSc: 0-No pain                 Barnet Glasgow

## 2022-07-08 NOTE — Transfer of Care (Signed)
Immediate Anesthesia Transfer of Care Note  Patient: Jake Collins  Procedure(s) Performed: TOTAL KNEE ARTHROPLASTY (Right: Knee)  Patient Location: PACU  Anesthesia Type:Spinal and MAC combined with regional for post-op pain  Level of Consciousness: awake, alert , and oriented  Airway & Oxygen Therapy: Patient Spontanous Breathing and Patient connected to face mask oxygen  Post-op Assessment: Report given to RN and Post -op Vital signs reviewed and stable  Post vital signs: Reviewed and stable  Last Vitals:  Vitals Value Taken Time  BP 109/63 07/08/22 1219  Temp    Pulse 70 07/08/22 1220  Resp 19 07/08/22 1220  SpO2 95 % 07/08/22 1220  Vitals shown include unvalidated device data.  Last Pain:  Vitals:   07/08/22 0828  TempSrc:   PainSc: 0-No pain      Patients Stated Pain Goal: 4 (60/63/01 6010)  Complications: No notable events documented.

## 2022-07-08 NOTE — Evaluation (Signed)
Physical Therapy Evaluation Patient Details Name: Jake Collins MRN: [id number] DOB: 03/12/1946 Today's Date: 07/08/2022  History of Present Illness  Pt is a 77yo male presenting s/p R-TKA on 07/08/22. PMH: HLD, HTN, scoliosis, L-THA 2020, hypothyroidism,  Clinical Impression  Jake Collins is a 77 y.o. male POD 0 s/p R-TKa. Patient reports independence with mobility at baseline but does endorse recent falls with pain in bilateral shoulders, pt denies seeking medical attention acutely. Patient is now limited by functional impairments (see PT problem list below) and requires min assist for bed mobility and for transfers. Patient was able to ambulate 20 feet with RW and min assist. Patient instructed in exercise to facilitate ROM and circulation to manage edema. Provided incentive spirometer and with Vcs pt able to achieve 1051m. Patient will benefit from continued skilled PT interventions to address impairments and progress towards PLOF. Acute PT will follow to progress mobility and stair training in preparation for safe discharge home.       Recommendations for follow up therapy are one component of a multi-disciplinary discharge planning process, led by the attending physician.  Recommendations may be updated based on patient status, additional functional criteria and insurance authorization.  Follow Up Recommendations Follow physician's recommendations for discharge plan and follow up therapies      Assistance Recommended at Discharge Frequent or constant Supervision/Assistance  Patient can return home with the following  A little help with walking and/or transfers;A little help with bathing/dressing/bathroom;Assistance with cooking/housework;Assist for transportation;Help with stairs or ramp for entrance    Equipment Recommendations Rolling walker (2 wheels)  Recommendations for Other Services       Functional Status Assessment Patient has had a recent decline in their functional  status and demonstrates the ability to make significant improvements in function in a reasonable and predictable amount of time.     Precautions / Restrictions Precautions Precautions: Fall;Knee Precaution Booklet Issued: No Precaution Comments: Hx of fall (recent), no pillow under the knee Required Braces or Orthoses: Knee Immobilizer - Right Knee Immobilizer - Right: On when out of bed or walking;Discontinue post op day 2 Restrictions Weight Bearing Restrictions: No Other Position/Activity Restrictions: wbat      Mobility  Bed Mobility Overal bed mobility: Needs Assistance Bed Mobility: Supine to Sit     Supine to sit: HOB elevated, Min assist     General bed mobility comments: Min assist for bringing RLE off bed, otherwise min guard    Transfers Overall transfer level: Needs assistance Equipment used: Rolling walker (2 wheels) Transfers: Sit to/from Stand Sit to Stand: Min assist, From elevated surface           General transfer comment: Min assist for bringing weight forward, VCs for sequencing and "nose over toes," use of BUE to power up.    Ambulation/Gait Ambulation/Gait assistance: Min assist Gait Distance (Feet): 20 Feet Assistive device: Rolling walker (2 wheels) Gait Pattern/deviations: Step-to pattern, Drifts right/left Gait velocity: decreased     General Gait Details: Pt ambulated with RW and min assist for AD management (pt drifting R/L, pushing RW far in advance of body); pt required moderate VCs for proximity to device. No overt LOB noted.  Stairs            Wheelchair Mobility    Modified Rankin (Stroke Patients Only)       Balance Overall balance assessment: Needs assistance, History of Falls Sitting-balance support: Feet supported, No upper extremity supported Sitting balance-Leahy Scale: Good  Standing balance support: Reliant on assistive device for balance, During functional activity, Bilateral upper extremity  supported Standing balance-Leahy Scale: Poor                               Pertinent Vitals/Pain Pain Assessment Pain Assessment: 0-10 Pain Score: 3  Pain Location: right knee Pain Descriptors / Indicators: Operative site guarding Pain Intervention(s): Limited activity within patient's tolerance, Monitored during session, Repositioned, Ice applied    Home Living Family/patient expects to be discharged to:: Private residence Living Arrangements: Spouse/significant other Available Help at Discharge: Family;Available 24 hours/day Type of Home: House Home Access: Stairs to enter Entrance Stairs-Rails: None Entrance Stairs-Number of Steps: 1   Home Layout: One level Home Equipment: Shower seat - built in      Prior Function Prior Level of Function : Independent/Modified Independent             Mobility Comments: IND; falls: fell and hurt R-shoulder. ADLs Comments: IND     Hand Dominance        Extremity/Trunk Assessment   Upper Extremity Assessment Upper Extremity Assessment: Overall WFL for tasks assessed (Fall: recent fall, reporting pain in bilateral shoulders)    Lower Extremity Assessment Lower Extremity Assessment: LLE deficits/detail;RLE deficits/detail RLE Deficits / Details: MMT ank DF/PF 5/5, NO extensor lag noted RLE Sensation: WNL LLE Deficits / Details: MMT ank DF/PF 5/5 LLE Sensation: WNL    Cervical / Trunk Assessment Cervical / Trunk Assessment: Normal  Communication   Communication: No difficulties  Cognition Arousal/Alertness: Awake/alert Behavior During Therapy: WFL for tasks assessed/performed Overall Cognitive Status: Within Functional Limits for tasks assessed                                          General Comments General comments (skin integrity, edema, etc.): Wife [name] present    Exercises Total Joint Exercises Ankle Circles/Pumps: AROM, Both, 10 reps   Assessment/Plan    PT Assessment  Patient needs continued PT services  PT Problem List Decreased strength;Decreased range of motion;Decreased activity tolerance;Decreased balance;Decreased mobility;Pain       PT Treatment Interventions DME instruction;Gait training;Stair training;Functional mobility training;Therapeutic activities;Therapeutic exercise;Balance training;Neuromuscular re-education;Patient/family education    PT Goals (Current goals can be found in the Care Plan section)  Acute Rehab PT Goals Patient Stated Goal: Playing golf PT Goal Formulation: With patient Time For Goal Achievement: 07/15/22 Potential to Achieve Goals: Good    Frequency 7X/week     Co-evaluation               AM-PAC PT "6 Clicks" Mobility  Outcome Measure Help needed turning from your back to your side while in a flat bed without using bedrails?: A Little Help needed moving from lying on your back to sitting on the side of a flat bed without using bedrails?: A Little Help needed moving to and from a bed to a chair (including a wheelchair)?: A Little Help needed standing up from a chair using your arms (e.g., wheelchair or bedside chair)?: A Little Help needed to walk in hospital room?: A Little Help needed climbing 3-5 steps with a railing? : A Lot 6 Click Score: 17    End of Session Equipment Utilized During Treatment: Gait belt;Right knee immobilizer Activity Tolerance: Patient tolerated treatment well;No increased pain Patient left: in chair;with call bell/phone  within reach;with chair alarm set;with family/visitor present Nurse Communication: Mobility status PT Visit Diagnosis: Pain;Difficulty in walking, not elsewhere classified (R26.2) Pain - Right/Left: Right Pain - part of body: Knee    Time: 2346-8873 PT Time Calculation (min) (ACUTE ONLY): 19 min   Charges:   PT Evaluation $PT Eval Low Complexity: [place], PT, DPT [facility] Office: [phone] Weekend  pager: [phone]  Coolidge Breeze 07/08/2022, 5:31 PM

## 2022-07-08 NOTE — Anesthesia Procedure Notes (Signed)
Spinal  Patient location during procedure: OR Start time: 07/08/2022 10:35 AM End time: 07/08/2022 10:40 AM Reason for block: surgical anesthesia Staffing Performed: resident/CRNA  Resident/CRNA: Genelle Bal, CRNA Performed by: Genelle Bal, CRNA Authorized by: Barnet Glasgow, MD   Preanesthetic Checklist Completed: patient identified, IV checked, site marked, risks and benefits discussed, surgical consent, monitors and equipment checked, pre-op evaluation and timeout performed Spinal Block Patient position: sitting Prep: DuraPrep Patient monitoring: heart rate, cardiac monitor, continuous pulse ox and blood pressure Approach: midline Location: L3-4 Injection technique: single-shot Needle Needle type: Sprotte  Needle gauge: 24 G Needle length: 9 cm Assessment Sensory level: T4 Events: CSF return

## 2022-07-08 NOTE — Care Plan (Signed)
Ortho Bundle Case Management Note  Patient Details  Name: Jake Collins MRN: [id number] Date of Birth: 10-Jul-1945  R TKA on 07-08-22 DCP:  Home with wife DME:  RW ordered through [facility] PT:  [facility] on 07-12-22                   DME Arranged:  Gilford Rile rolling DME Agency:  [employer]  HH Arranged:  NA [place] Agency:  NA  Additional Comments: Please contact me with any questions of if this plan should need to change.  Dario Ave, Case Manager, Rosanne Gutting [phone] 07/08/2022, 3:59 PM

## 2022-07-08 NOTE — Anesthesia Procedure Notes (Signed)
Anesthesia Regional Block: Adductor canal block   Pre-Anesthetic Checklist: , timeout performed,  Correct Patient, Correct Site, Correct Laterality,  Correct Procedure, Correct Position, site marked,  Risks and benefits discussed,  Surgical consent,  Pre-op evaluation,  At surgeon's request and post-op pain management  Laterality: Lower and Right  Prep: chloraprep       Needles:  Injection technique: Single-shot  Needle Type: Echogenic Needle     Needle Length: 9cm  Needle Gauge: 22     Additional Needles:   Procedures:,,,, ultrasound used (permanent image in chart),,    Narrative:  Start time: 07/08/2022 9:40 AM End time: 07/08/2022 9:45 AM Injection made incrementally with aspirations every 5 mL.  Performed by: Personally  Anesthesiologist: Barnet Glasgow, MD  Additional Notes: Block assessed prior to surgery. Pt tolerated procedure well.

## 2022-07-08 NOTE — Interval H&P Note (Signed)
History and Physical Interval Note:  07/08/2022 8:29 AM  Jake Collins  has presented today for surgery, with the diagnosis of right knee osteoarthritis.  The various methods of treatment have been discussed with the patient and family. After consideration of risks, benefits and other options for treatment, the patient has consented to  Procedure(s): TOTAL KNEE ARTHROPLASTY (Right) as a surgical intervention.  The patient's history has been reviewed, patient examined, no change in status, stable for surgery.  I have reviewed the patient's chart and labs.  Questions were answered to the patient's satisfaction.     Pilar Plate Remonia Otte

## 2022-07-09 ENCOUNTER — Encounter (HOSPITAL_COMMUNITY): Payer: Self-pay | Admitting: Orthopedic Surgery

## 2022-07-09 LAB — BASIC METABOLIC PANEL
Anion gap: 11 (ref 5–15)
BUN: 19 mg/dL (ref 8–23)
CO2: 23 mmol/L (ref 22–32)
Calcium: 8.4 mg/dL — ABNORMAL LOW (ref 8.9–10.3)
Chloride: 104 mmol/L (ref 98–111)
Creatinine, Ser: 1.23 mg/dL (ref 0.61–1.24)
GFR, Estimated: >60 mL/min (ref >60)
Glucose, Bld: 125 mg/dL — ABNORMAL HIGH (ref 70–99)
Potassium: 4.3 mmol/L (ref 3.5–5.1)
Sodium: 138 mmol/L (ref 135–145)

## 2022-07-09 LAB — CBC
HCT: 41.2 % (ref 39.0–52.0)
Hemoglobin: 13.8 g/dL (ref 13.0–17.0)
MCH: 31.7 pg (ref 26.0–34.0)
MCHC: 33.5 g/dL (ref 30.0–36.0)
MCV: 94.5 fL (ref 80.0–100.0)
Platelets: 190 10*3/uL (ref 150–400)
RBC: 4.36 MIL/uL (ref 4.22–5.81)
RDW: 12.8 % (ref 11.5–15.5)
WBC: 18 10*3/uL — ABNORMAL HIGH (ref 4.0–10.5)
nRBC: 0 % (ref 0.0–0.2)

## 2022-07-09 MED ORDER — OXYCODONE HCL 5 MG PO TABS: 5.0000 mg | ORAL_TABLET | Freq: Four times a day (QID) | ORAL | 0 refills | Status: DC | PRN

## 2022-07-09 MED ORDER — ACETAMINOPHEN 500 MG PO TABS: 1000.0000 mg | ORAL_TABLET | Freq: Three times a day (TID) | ORAL | 0 refills | Status: AC | PRN

## 2022-07-09 MED ORDER — ASPIRIN 325 MG PO TBEC: 325.0000 mg | DELAYED_RELEASE_TABLET | Freq: Two times a day (BID) | ORAL | 0 refills | Status: AC

## 2022-07-09 MED ORDER — METHOCARBAMOL 500 MG PO TABS: 500.0000 mg | ORAL_TABLET | Freq: Four times a day (QID) | ORAL | 0 refills | Status: DC | PRN

## 2022-07-09 NOTE — Progress Notes (Signed)
   Subjective: 1 Day Post-Op Procedure(s) (LRB): TOTAL KNEE ARTHROPLASTY (Right) Patient reports pain as mild.   Patient seen in rounds by Dr. Wynelle Link. Patient is well, and has had no acute complaints or problems  We will continue therapy today.  Objective: Vital signs in last 24 hours: Temp:  [96.7 F (35.9 C)-98.6 F (37 C)] 97.6 F (36.4 C) (01/30 0620) Pulse Rate:  [51-79] 77 (01/30 0620) Resp:  [12-20] 18 (01/30 0620) BP: (103-163)/(62-87) 138/72 (01/30 0620) SpO2:  [93 %-100 %] 95 % (01/30 0620) Weight:  [80.3 kg] 80.3 kg (01/29 0828)  Intake/Output from previous day:  Intake/Output Summary (Last 24 hours) at 07/09/2022 0825 Last data filed at 07/09/2022 0600 Gross per 24 hour  Intake 2356 ml  Output 2100 ml  Net 256 ml     Intake/Output this shift: No intake/output data recorded.  Labs: Recent Labs    07/09/22 0337  HGB 13.8   Recent Labs    07/09/22 0337  WBC 18.0*  RBC 4.36  HCT 41.2  PLT 190   Recent Labs    07/09/22 0337  NA 138  K 4.3  CL 104  CO2 23  BUN 19  CREATININE 1.23  GLUCOSE 125*  CALCIUM 8.4*   No results for input(s): "LABPT", "INR" in the last 72 hours.  Exam: General - Patient is Alert and Oriented Extremity - Neurovascular intact Dorsiflexion/Plantar flexion intact Dressing - dressing C/D/I Motor Function - intact, moving foot and toes well on exam.   Past Medical History:  Diagnosis Date   Arthritis    Cancer ([place])    skin melanoma   Depression    Diverticulitis    2 bouts in 10 years   Hyperlipemia    Hypertension    Hypothyroid    Scoliosis    Thyroid disease     Assessment/Plan: 1 Day Post-Op Procedure(s) (LRB): TOTAL KNEE ARTHROPLASTY (Right) Principal Problem:   OA (osteoarthritis) of knee Active Problems:   Primary osteoarthritis of right knee  Estimated body mass index is 25.4 kg/m as calculated from the following:   Height as of this encounter: '5\' 10"'$  (1.778 m).   Weight as of this  encounter: 80.3 kg.   Patient's anticipated LOS is less than 2 midnights, meeting these requirements: - Younger than 69 - Lives within 1 hour of care - Has a competent adult at home to recover with post-op recover - NO history of  - Chronic pain requiring opiods  - Diabetes  - Coronary Artery Disease  - Heart failure  - Heart attack  - Stroke  - DVT/VTE  - Cardiac arrhythmia  - Respiratory Failure/COPD  - Renal failure  - Anemia  - Advanced Liver disease     Up with therapy for morning and afternoon sessions today. DVT Prophylaxis - Aspirin Weight bearing as tolerated.  Plan is to go Home with outpatient PT scheduled after hospital stay. D/c home this afternoon if he continues to progress and meeting goals.  Shearon Balo, PA-C Orthopedic Surgery 07/09/2022, 8:25 AM

## 2022-07-09 NOTE — Plan of Care (Signed)
  Problem: Activity: Goal: Ability to avoid complications of mobility impairment will improve Outcome: Not Progressing   Problem: Activity: Goal: Range of joint motion will improve Outcome: Not Progressing   Problem: Pain Management: Goal: Pain level will decrease with appropriate interventions Outcome: Not Progressing

## 2022-07-09 NOTE — Progress Notes (Signed)
Physical Therapy Treatment Patient Details Name: Jake Collins MRN: [id number] DOB: [birth year] Today's Date: 07/09/2022   History of Present Illness Pt is a 77yo male presenting s/p R-TKA on 07/08/22. PMH: HLD, HTN, scoliosis, L-THA 2020, hypothyroidism,    PT Comments    POD # 1 pm session Spouse present for D/C Education on amb and stairs General Comments: showing a little confusion this afternoon.  Pt was talking about sitting in anothet chair closer to the TV.  Pt pulled out his IV site "because no one else was going to do it". Nurse Communication: Mobility status (pt did NOT meet safe mobility goals to D/C today plus pt having issues with urinary leakage which also includes pink blood.  Leakage seen on bed and recliner.  Leakage seen on floor. General transfer comment: 25% VC's on proper hand placement to avoid pulling self up using walker.  50% VC's on proper walker usage throughout turn as pt tends to reach too far past midline for recliner causing increased FALL RISK.  Had Spouse "hands on" assist using safety belt.  Pt had one posterior LOB with his first sit to stand falling backward onto recliner uncontrolled. General Gait Details: decreased amb distance and present with "impaired" coordination and poor forward flexed posture.  Unsteady.  Pt blames this on fatigue and poor sleep last night.  Had Spouse "hands on" asisst with amb in hallway using his safety belt.  Not safe to amb on his own this afternoon. General stair comments: 50% VC's on proper tech as pt was unable to recall proper sequencing from doing this task this morning.  Had Spouse "hands on" assist pt using safety belt.  Unsteady.  Poor posture.  Impaired balance. General bed mobility comments: assisted back to bed per RN request to Bladder Scan Pt. Pt has NOT met his mobility goals to safely D/C to home today.   Recommendations for follow up therapy are one component of a multi-disciplinary discharge planning process, led by  the attending physician.  Recommendations may be updated based on patient status, additional functional criteria and insurance authorization.  Follow Up Recommendations  Follow physician's recommendations for discharge plan and follow up therapies     Assistance Recommended at Discharge Frequent or constant Supervision/Assistance  Patient can return home with the following A little help with walking and/or transfers;A little help with bathing/dressing/bathroom;Assistance with cooking/housework;Assist for transportation;Help with stairs or ramp for entrance   Equipment Recommendations  Rolling walker (2 wheels)    Recommendations for Other Services       Precautions / Restrictions Precautions Precaution Comments: Hx of fall (recent), no pillow under the knee Restrictions Weight Bearing Restrictions: No RLE Weight Bearing: Weight bearing as tolerated     Mobility  Bed Mobility Overal bed mobility: Needs Assistance Bed Mobility: Sit to Supine     Supine to sit: Min assist     General bed mobility comments: assisted back to bed per RN request to Bladder Scan Pt.    Transfers Overall transfer level: Needs assistance Equipment used: Rolling walker (2 wheels) Transfers: Sit to/from Stand Sit to Stand: Supervision, Min guard           General transfer comment: 25% VC's on proper hand placement to avoid pulling self up using walker.  50% VC's on proper walker usage throughout turn as pt tends to reach too far past midline for recliner causing increased FALL RISK.  Had Spouse "hands on" assist using safety belt.  Pt had one posterior  LOB with his first sit to stand falling backward onto recliner uncontrolled.    Ambulation/Gait Ambulation/Gait assistance: Min guard, Min assist Gait Distance (Feet): 18 Feet Assistive device: Rolling walker (2 wheels) Gait Pattern/deviations: Step-to pattern, Drifts right/left Gait velocity: decreased     General Gait Details: decreased  amb distance and present with "impaired" coordination and poor forward flexed posture.  Unsteady.  Pt blames this on fatigue and poor sleep last night.  Had Spouse "hands on" asisst with amb in hallway using his safety belt.  Not safe to amb on his own this afternoon.   Stairs Stairs: Yes Stairs assistance: Min assist, Mod assist Stair Management: No rails, Step to pattern, Forwards, With walker Number of Stairs: 1 General stair comments: 50% VC's on proper tech as pt was unable to recall proper sequencing from doing this task this morning.  Had Spouse "hands on" assist pt using safety belt.  Unsteady.  Poor posture.  Impaired balance.   Wheelchair Mobility    Modified Rankin (Stroke Patients Only)       Balance                                            Cognition Arousal/Alertness: Awake/alert Behavior During Therapy: WFL for tasks assessed/performed Overall Cognitive Status: Within Functional Limits for tasks assessed                                 General Comments: showing a little confusion this afternoon.  Pt was talking about sitting in anothet chair closer to the TV.  Pt pulled out his IV site "because no one else was going to do it".        Exercises      General Comments        Pertinent Vitals/Pain Pain Assessment Pain Assessment: 0-10 Pain Score: 5  Pain Location: right knee Pain Descriptors / Indicators: Operative site guarding, Tender Pain Intervention(s): Monitored during session, Premedicated before session, Repositioned, Ice applied    Home Living                          Prior Function            PT Goals (current goals can now be found in the care plan section) Progress towards PT goals: Progressing toward goals    Frequency    7X/week      PT Plan Current plan remains appropriate    Co-evaluation              AM-PAC PT "6 Clicks" Mobility   Outcome Measure  Help needed turning  from your back to your side while in a flat bed without using bedrails?: A Little Help needed moving from lying on your back to sitting on the side of a flat bed without using bedrails?: A Little Help needed moving to and from a bed to a chair (including a wheelchair)?: A Little Help needed standing up from a chair using your arms (e.g., wheelchair or bedside chair)?: A Little Help needed to walk in hospital room?: A Little Help needed climbing 3-5 steps with a railing? : A Lot 6 Click Score: 17    End of Session Equipment Utilized During Treatment: Gait belt Activity Tolerance: Patient limited by fatigue Patient left: in  bed;with call bell/phone within reach;with bed alarm set;with family/visitor present Nurse Communication: Mobility status (pt did NOT meet safe mobility goals to D/C today plus pt having issues with urinary leakage which also includes pink blood.  Leakage seen on bed and recliner.  Leakage seen on floor.) PT Visit Diagnosis: Pain;Difficulty in walking, not elsewhere classified (R26.2) Pain - Right/Left: Right Pain - part of body: Knee     Time: 9381-8299 PT Time Calculation (min) (ACUTE ONLY): 26 min  Charges:  $Gait Training: 8-22 mins $Therapeutic Activity: 8-22 mins                     Rica Koyanagi  PTA [place] Office M-F          [phone] Weekend pager [phone]

## 2022-07-09 NOTE — TOC Transition Note (Signed)
Transition of Care [facility]) - CM/SW Discharge Note  Patient Details  Name: Jake Collins MRN: [id number] Date of Birth: [birth year]  [facility] CM/SW Contact:  Sherie Don, LCSW Phone Number: 07/09/2022, 11:18 AM  Clinical Narrative: Patient is expected to discharge home after working with PT. CSW met with patient to confirm discharge plan and needs. Patient will go home with OPPT at [facility]. Patient will need a rolling walker, which [employer] delivered to patient's room. TOC signing off.    Final next level of care: OP Rehab Barriers to Discharge: No Barriers Identified  Patient Goals and CMS Choice CMS Medicare.gov Compare Post Acute Care list provided to:: Patient Choice offered to / list presented to : Patient  Discharge Plan and Services Additional resources added to the After Visit Summary for           DME Arranged: Walker rolling DME Agency: [employer] HH Arranged: NA [place] Agency: NA  Social Determinants of Health (SDOH) Interventions SDOH Screenings   Food Insecurity: No Food Insecurity (07/08/2022)  Housing: Low Risk  (07/08/2022)  Transportation Needs: No Transportation Needs (07/08/2022)  Utilities: Not At Risk (07/08/2022)  Tobacco Use: Low Risk  (07/09/2022)   Readmission Risk Interventions     No data to display

## 2022-07-09 NOTE — Progress Notes (Signed)
Physical Therapy Treatment Patient Details Name: Jake Collins MRN: [id number] DOB: 02-19-1946 Today's Date: 07/09/2022   History of Present Illness Pt is a 77yo male presenting s/p R-TKA on 07/08/22. PMH: HLD, HTN, scoliosis, L-THA 2020, hypothyroidism,    PT Comments    POD # 1 am session AxO x 1 pleasant but reported poor sleep last night.Assisted OOB to amb required increased time.  General bed mobility comments: demonstarted and instructed how to use his belt to self assist LE  General transfer comment: 25% VC's on proper hand placement to avoid pulling self up using walker.  50% VC's on proper walker usage throughout turn as pt tends to reach too far past midline for recliner causing increased FALL RISK. General Gait Details: 25% VC's on proper walker to self distance esp with turns as pt tends to push too far out front. General stair comments: 75% VC's on proper walker placement as well as sequencing.  Pt has ONE step into his home and ONR step up to [facility]  Pt was unsteady.  Pt will need another PT session with Spouse before safe D/C to home.   Recommendations for follow up therapy are one component of a multi-disciplinary discharge planning process, led by the attending physician.  Recommendations may be updated based on patient status, additional functional criteria and insurance authorization.  Follow Up Recommendations  Follow physician's recommendations for discharge plan and follow up therapies     Assistance Recommended at Discharge Frequent or constant Supervision/Assistance  Patient can return home with the following A little help with walking and/or transfers;A little help with bathing/dressing/bathroom;Assistance with cooking/housework;Assist for transportation;Help with stairs or ramp for entrance   Equipment Recommendations  Rolling walker (2 wheels)    Recommendations for Other Services       Precautions / Restrictions Precautions Precaution Comments: Hx of fall  (recent), no pillow under the knee Restrictions Weight Bearing Restrictions: No RLE Weight Bearing: Weight bearing as tolerated     Mobility  Bed Mobility Overal bed mobility: Needs Assistance Bed Mobility: Supine to Sit     Supine to sit: Supervision, Min guard     General bed mobility comments: demonstarted and instructed how to use his belt to self assist LE    Transfers Overall transfer level: Needs assistance Equipment used: Rolling walker (2 wheels) Transfers: Sit to/from Stand Sit to Stand: Supervision, Min guard           General transfer comment: 25% VC's on proper hand placement to avoid pulling self up using walker.  50% VC's on proper walker usage throughout turn as pt tends to reach too far past midline for recliner causing increased FALL RISK.    Ambulation/Gait Ambulation/Gait assistance: Supervision, Min guard Gait Distance (Feet): 38 Feet Assistive device: Rolling walker (2 wheels) Gait Pattern/deviations: Step-to pattern, Drifts right/left Gait velocity: decreased     General Gait Details: 25% VC's on proper walker to self distance esp with turns as pt tends to push too far out front.   Stairs Stairs: Yes Stairs assistance: Min assist, Mod assist Stair Management: No rails, Step to pattern, Forwards, With walker Number of Stairs: 1 General stair comments: 75% VC's on proper walker placement as well as sequencing.  Pt has ONE step into his home and ONR step up to [employer] [facility] (Stroke Patients Only)       Balance  Cognition Arousal/Alertness: Awake/alert Behavior During Therapy: WFL for tasks assessed/performed Overall Cognitive Status: Within Functional Limits for tasks assessed                                 General Comments: AxO x 3 reports poor sleep last night        Exercises   Total Knee Replacement TE's following  HEP handout 10 reps B LE ankle pumps 05 reps towel squeezes 05 reps knee presses 05 reps heel slides  05 reps SAQ's 05 reps SLR's 05 reps ABD Educated on use of gait belt to assist with TE's Followed by ICE    General Comments        Pertinent Vitals/Pain Pain Assessment Pain Assessment: 0-10 Pain Score: 5  Pain Location: right knee Pain Descriptors / Indicators: Operative site guarding, Tender Pain Intervention(s): Monitored during session, Premedicated before session, Repositioned, Ice applied    Home Living                          Prior Function            PT Goals (current goals can now be found in the care plan section)      Frequency    7X/week      PT Plan Current plan remains appropriate    Co-evaluation              AM-PAC PT "6 Clicks" Mobility   Outcome Measure  Help needed turning from your back to your side while in a flat bed without using bedrails?: A Little Help needed moving from lying on your back to sitting on the side of a flat bed without using bedrails?: A Little Help needed moving to and from a bed to a chair (including a wheelchair)?: A Little Help needed standing up from a chair using your arms (e.g., wheelchair or bedside chair)?: A Little Help needed to walk in hospital room?: A Little Help needed climbing 3-5 steps with a railing? : A Lot 6 Click Score: 17    End of Session Equipment Utilized During Treatment: Gait belt Activity Tolerance: Patient tolerated treatment well;No increased pain Patient left: in chair;with call bell/phone within reach;with chair alarm set;with family/visitor present Nurse Communication: Mobility status PT Visit Diagnosis: Pain;Difficulty in walking, not elsewhere classified (R26.2) Pain - Right/Left: Right Pain - part of body: Knee     Time: [id number] PT Time Calculation (min) (ACUTE ONLY): 28 min  Charges:  $Gait Training: 8-22 mins $Therapeutic Exercise: 8-22 mins                      Rica Koyanagi  PTA [place] Office M-F          [phone] Weekend pager [phone]

## 2022-07-10 ENCOUNTER — Ambulatory Visit: Payer: Self-pay

## 2022-07-10 NOTE — Telephone Encounter (Signed)
Tried blood before left having lt pink - no burning- traumatic blood - in hospital Tuesday lunchtime. Aspirin change to something.    Chief Complaint: blood noted in brief and lt pink urine Symptoms: lt pink urine with blood noted in disposable brief since discharge Frequency: Since Tuesday Pertinent Negatives: Patient denies urination pain, burning, fever, back pain Disposition: '[]'$ ED /'[]'$ Urgent Care (no appt availability in office) / '[]'$ Appointment(In office/virtual)/ '[]'$  [facility]/ '[]'$ Home Care/ '[]'$ Refused Recommended Disposition /'[]'$ [facility]/ '[x]'$  Follow-up with PCP Additional Notes: pt's wife called Community line. Advised wife will call surgeon. Instead called PCP for recommendations. Leanna Battles wife Jeanette's phone number :[phone]. Jeanett Schlein stated she will call her.   Reason for Disposition  Taking Coumadin (warfarin) or other strong blood thinner, or known bleeding disorder (e.g., thrombocytopenia)    Twice a day aspirin  Answer Assessment - Initial Assessment Questions 1. COLOR of URINE: "Describe the color of the urine."  (e.g., tea-colored, pink, red, bloody) "Do you have blood clots in your urine?" (e.g., none, pea, grape, small coin)     Lt pink 2. ONSET: "When did the bleeding start?"      Started after catheter removal and has continued-Tuesday 3. EPISODES: "How many times has there been blood in the urine?" or "How many times today?"     Ever since discharge 4. PAIN with URINATION: "Is there any pain with passing your urine?" If Yes, ask: "How bad is the pain?"  (Scale 1-10; or mild, moderate, severe)    - MILD: Complains slightly about urination hurting.    - MODERATE: Interferes with normal activities.      - SEVERE: Excruciating, unwilling or unable to urinate because of the pain.      denies 5. FEVER: "Do you have a fever?" If Yes, ask: "What is your temperature, how was it measured, and when did it start?"     denies 6. ASSOCIATED  SYMPTOMS: "Are you passing urine more frequently than usual?"     no 7. OTHER SYMPTOMS: "Do you have any other symptoms?" (e.g., back/flank pain, abdomen pain, vomiting)     no 8. PREGNANCY: "Is there any chance you are pregnant?" "When was your last menstrual period?"     N/a  Protocols used: Urine - Blood In-A-AH

## 2022-07-11 ENCOUNTER — Encounter (HOSPITAL_COMMUNITY): Payer: Self-pay

## 2022-07-11 ENCOUNTER — Inpatient Hospital Stay (HOSPITAL_COMMUNITY)
Admission: EM | Admit: 2022-07-11 | Discharge: 2022-07-15 | DRG: 981 | Disposition: A | Discharge status: Home Health | Payer: PPO | Attending: Family Medicine | Admitting: Family Medicine

## 2022-07-11 ENCOUNTER — Other Ambulatory Visit: Payer: Self-pay

## 2022-07-11 DIAGNOSIS — Z9842 Cataract extraction status, left eye: Secondary | ICD-10-CM

## 2022-07-11 DIAGNOSIS — E785 Hyperlipidemia, unspecified: Secondary | ICD-10-CM | POA: Diagnosis present

## 2022-07-11 DIAGNOSIS — I452 Bifascicular block: Secondary | ICD-10-CM | POA: Diagnosis present

## 2022-07-11 DIAGNOSIS — Z79890 Hormone replacement therapy (postmenopausal): Secondary | ICD-10-CM

## 2022-07-11 DIAGNOSIS — K922 Gastrointestinal hemorrhage, unspecified: Secondary | ICD-10-CM

## 2022-07-11 DIAGNOSIS — E871 Hypo-osmolality and hyponatremia: Secondary | ICD-10-CM | POA: Diagnosis present

## 2022-07-11 DIAGNOSIS — I10 Essential (primary) hypertension: Secondary | ICD-10-CM | POA: Diagnosis present

## 2022-07-11 DIAGNOSIS — D649 Anemia, unspecified: Secondary | ICD-10-CM | POA: Diagnosis present

## 2022-07-11 DIAGNOSIS — K2101 Gastro-esophageal reflux disease with esophagitis, with bleeding: Secondary | ICD-10-CM | POA: Diagnosis present

## 2022-07-11 DIAGNOSIS — K449 Diaphragmatic hernia without obstruction or gangrene: Secondary | ICD-10-CM | POA: Diagnosis present

## 2022-07-11 DIAGNOSIS — M1711 Unilateral primary osteoarthritis, right knee: Secondary | ICD-10-CM | POA: Diagnosis present

## 2022-07-11 DIAGNOSIS — T50995A Adverse effect of other drugs, medicaments and biological substances, initial encounter: Secondary | ICD-10-CM | POA: Diagnosis present

## 2022-07-11 DIAGNOSIS — D72829 Elevated white blood cell count, unspecified: Secondary | ICD-10-CM | POA: Diagnosis present

## 2022-07-11 DIAGNOSIS — D62 Acute posthemorrhagic anemia: Secondary | ICD-10-CM | POA: Diagnosis present

## 2022-07-11 DIAGNOSIS — D638 Anemia in other chronic diseases classified elsewhere: Secondary | ICD-10-CM | POA: Diagnosis not present

## 2022-07-11 DIAGNOSIS — E876 Hypokalemia: Secondary | ICD-10-CM | POA: Diagnosis present

## 2022-07-11 DIAGNOSIS — G9341 Metabolic encephalopathy: Secondary | ICD-10-CM | POA: Diagnosis present

## 2022-07-11 DIAGNOSIS — N1330 Unspecified hydronephrosis: Secondary | ICD-10-CM | POA: Diagnosis present

## 2022-07-11 DIAGNOSIS — Z79899 Other long term (current) drug therapy: Secondary | ICD-10-CM

## 2022-07-11 DIAGNOSIS — I499 Cardiac arrhythmia, unspecified: Secondary | ICD-10-CM | POA: Diagnosis not present

## 2022-07-11 DIAGNOSIS — Z9049 Acquired absence of other specified parts of digestive tract: Secondary | ICD-10-CM

## 2022-07-11 DIAGNOSIS — E039 Hypothyroidism, unspecified: Secondary | ICD-10-CM | POA: Diagnosis present

## 2022-07-11 DIAGNOSIS — Z9841 Cataract extraction status, right eye: Secondary | ICD-10-CM | POA: Diagnosis not present

## 2022-07-11 DIAGNOSIS — Z961 Presence of intraocular lens: Secondary | ICD-10-CM | POA: Diagnosis present

## 2022-07-11 DIAGNOSIS — Z882 Allergy status to sulfonamides status: Secondary | ICD-10-CM | POA: Diagnosis not present

## 2022-07-11 DIAGNOSIS — K920 Hematemesis: Secondary | ICD-10-CM | POA: Diagnosis not present

## 2022-07-11 DIAGNOSIS — Z85820 Personal history of malignant melanoma of skin: Secondary | ICD-10-CM | POA: Diagnosis not present

## 2022-07-11 DIAGNOSIS — M25761 Osteophyte, right knee: Secondary | ICD-10-CM | POA: Diagnosis present

## 2022-07-11 DIAGNOSIS — F32A Depression, unspecified: Secondary | ICD-10-CM | POA: Diagnosis present

## 2022-07-11 DIAGNOSIS — Z7982 Long term (current) use of aspirin: Secondary | ICD-10-CM

## 2022-07-11 DIAGNOSIS — Z96642 Presence of left artificial hip joint: Secondary | ICD-10-CM | POA: Diagnosis present

## 2022-07-11 DIAGNOSIS — R410 Disorientation, unspecified: Secondary | ICD-10-CM | POA: Diagnosis present

## 2022-07-11 DIAGNOSIS — K5900 Constipation, unspecified: Secondary | ICD-10-CM | POA: Diagnosis present

## 2022-07-11 DIAGNOSIS — R443 Hallucinations, unspecified: Secondary | ICD-10-CM | POA: Diagnosis present

## 2022-07-11 DIAGNOSIS — Z046 Encounter for general psychiatric examination, requested by authority: Secondary | ICD-10-CM | POA: Diagnosis not present

## 2022-07-11 DIAGNOSIS — K209 Esophagitis, unspecified without bleeding: Secondary | ICD-10-CM | POA: Diagnosis not present

## 2022-07-11 DIAGNOSIS — Z96651 Presence of right artificial knee joint: Secondary | ICD-10-CM

## 2022-07-11 DIAGNOSIS — X58XXXA Exposure to other specified factors, initial encounter: Secondary | ICD-10-CM | POA: Diagnosis present

## 2022-07-11 DIAGNOSIS — F411 Generalized anxiety disorder: Secondary | ICD-10-CM | POA: Diagnosis present

## 2022-07-11 DIAGNOSIS — Z8249 Family history of ischemic heart disease and other diseases of the circulatory system: Secondary | ICD-10-CM | POA: Diagnosis not present

## 2022-07-11 DIAGNOSIS — N179 Acute kidney failure, unspecified: Secondary | ICD-10-CM | POA: Diagnosis present

## 2022-07-11 LAB — COMPREHENSIVE METABOLIC PANEL
ALT: 13 U/L (ref 0–44)
AST: 28 U/L (ref 15–41)
Albumin: 2.8 g/dL — ABNORMAL LOW (ref 3.5–5.0)
Alkaline Phosphatase: 44 U/L (ref 38–126)
Anion gap: 12 (ref 5–15)
BUN: 80 mg/dL — ABNORMAL HIGH (ref 8–23)
CO2: 20 mmol/L — ABNORMAL LOW (ref 22–32)
Calcium: 8 mg/dL — ABNORMAL LOW (ref 8.9–10.3)
Chloride: 92 mmol/L — ABNORMAL LOW (ref 98–111)
Creatinine, Ser: 3.03 mg/dL — ABNORMAL HIGH (ref 0.61–1.24)
GFR, Estimated: 20 mL/min — ABNORMAL LOW (ref >60)
Glucose, Bld: 125 mg/dL — ABNORMAL HIGH (ref 70–99)
Potassium: 3.5 mmol/L (ref 3.5–5.1)
Sodium: 124 mmol/L — ABNORMAL LOW (ref 135–145)
Total Bilirubin: 0.9 mg/dL (ref 0.3–1.2)
Total Protein: 6 g/dL — ABNORMAL LOW (ref 6.5–8.1)

## 2022-07-11 LAB — CBC WITH DIFFERENTIAL/PLATELET
Abs Immature Granulocytes: 0.1 10*3/uL — ABNORMAL HIGH (ref 0.00–0.07)
Basophils Absolute: 0 10*3/uL (ref 0.0–0.1)
Basophils Relative: 0 %
Eosinophils Absolute: 0 10*3/uL (ref 0.0–0.5)
Eosinophils Relative: 0 %
HCT: 30.2 % — ABNORMAL LOW (ref 39.0–52.0)
Hemoglobin: 10.5 g/dL — ABNORMAL LOW (ref 13.0–17.0)
Immature Granulocytes: 1 %
Lymphocytes Relative: 8 %
Lymphs Abs: 1.5 10*3/uL (ref 0.7–4.0)
MCH: 31.6 pg (ref 26.0–34.0)
MCHC: 34.8 g/dL (ref 30.0–36.0)
MCV: 91 fL (ref 80.0–100.0)
Monocytes Absolute: 1.1 10*3/uL — ABNORMAL HIGH (ref 0.1–1.0)
Monocytes Relative: 6 %
Neutro Abs: 16.1 10*3/uL — ABNORMAL HIGH (ref 1.7–7.7)
Neutrophils Relative %: 85 %
Platelets: 164 10*3/uL (ref 150–400)
RBC: 3.32 MIL/uL — ABNORMAL LOW (ref 4.22–5.81)
RDW: 12.8 % (ref 11.5–15.5)
WBC: 18.8 10*3/uL — ABNORMAL HIGH (ref 4.0–10.5)
nRBC: 0 % (ref 0.0–0.2)

## 2022-07-11 LAB — URINALYSIS, ROUTINE W REFLEX MICROSCOPIC
Bilirubin Urine: NEGATIVE
Glucose, UA: NEGATIVE mg/dL
Ketones, ur: NEGATIVE mg/dL
Leukocytes,Ua: NEGATIVE
Nitrite: NEGATIVE
Protein, ur: 30 mg/dL — AB
Specific Gravity, Urine: 1.012 (ref 1.005–1.030)
pH: 6 (ref 5.0–8.0)

## 2022-07-11 LAB — TYPE AND SCREEN
ABO/RH(D): O NEG
Antibody Screen: NEGATIVE

## 2022-07-11 MED ORDER — ONDANSETRON HCL 4 MG/2ML IJ SOLN
4.0000 mg | Freq: Once | INTRAMUSCULAR | Status: AC
  Administered 2022-07-11: 4 mg via INTRAVENOUS
  Filled 2022-07-11: qty 2

## 2022-07-11 MED ORDER — PANTOPRAZOLE SODIUM 40 MG IV SOLR
40.0000 mg | Freq: Two times a day (BID) | INTRAVENOUS | Status: DC
  Administered 2022-07-12 – 2022-07-13 (×3): 40 mg via INTRAVENOUS
  Filled 2022-07-11 (×3): qty 10

## 2022-07-11 MED ORDER — ACETAMINOPHEN 325 MG PO TABS: 650.0000 mg | ORAL_TABLET | Freq: Four times a day (QID) | ORAL | Status: DC | PRN

## 2022-07-11 MED ORDER — BISACODYL 5 MG PO TBEC: 5.0000 mg | DELAYED_RELEASE_TABLET | Freq: Every day | ORAL | Status: DC | PRN

## 2022-07-11 MED ORDER — SODIUM CHLORIDE 0.9 % IV BOLUS
1000.0000 mL | Freq: Once | INTRAVENOUS | Status: AC
  Administered 2022-07-11: 1000 mL via INTRAVENOUS

## 2022-07-11 MED ORDER — PANTOPRAZOLE SODIUM 40 MG IV SOLR
40.0000 mg | Freq: Once | INTRAVENOUS | Status: AC
  Administered 2022-07-11: 40 mg via INTRAVENOUS
  Filled 2022-07-11: qty 10

## 2022-07-11 MED ORDER — ONDANSETRON HCL 4 MG/2ML IJ SOLN: 4.0000 mg | Freq: Four times a day (QID) | INTRAMUSCULAR | Status: DC | PRN

## 2022-07-11 MED ORDER — SODIUM CHLORIDE 0.9 % IV SOLN: INTRAVENOUS | Status: AC

## 2022-07-11 MED ORDER — SENNOSIDES-DOCUSATE SODIUM 8.6-50 MG PO TABS: 1.0000 | ORAL_TABLET | Freq: Every evening | ORAL | Status: DC | PRN

## 2022-07-11 MED ORDER — SODIUM CHLORIDE 0.9% FLUSH
3.0000 mL | Freq: Two times a day (BID) | INTRAVENOUS | Status: DC
  Administered 2022-07-12 – 2022-07-15 (×7): 3 mL via INTRAVENOUS

## 2022-07-11 MED ORDER — ACETAMINOPHEN 650 MG RE SUPP: 650.0000 mg | Freq: Four times a day (QID) | RECTAL | Status: DC | PRN

## 2022-07-11 NOTE — ED Triage Notes (Signed)
Pt arrives [facility] from home for emesis today. Right knee replacement 3 days ago.

## 2022-07-11 NOTE — ED Notes (Signed)
Pt. Made aware for the need of urine specimen. 

## 2022-07-11 NOTE — ED Provider Notes (Signed)
[facility] EMERGENCY DEPARTMENT AT [facility] Provider Note   CSN: [id number] Arrival date & time: 07/11/22  2108     History  Chief Complaint  Patient presents with   Emesis    Jake Collins is a 77 y.o. male.  Patient recently had knee replacement on Monday.  He has thrown up coffee-ground material Tuesday and Thursday.  Patient also has not been eating or drinking much for the last 2 days  The history is provided by the patient and medical records. No language interpreter was used.  Emesis Severity:  Mild Timing:  Intermittent Quality:  Coffee grounds Able to tolerate:  Liquids Progression:  Worsening Chronicity:  New Recent urination:  Decreased Relieved by:  Nothing Worsened by:  Nothing Ineffective treatments:  None tried Associated symptoms: no abdominal pain, no cough, no diarrhea and no headaches        Home Medications Prior to Admission medications   Medication Sig Start Date End Date Taking? Authorizing Provider  acetaminophen (TYLENOL) 500 MG tablet Take 2 tablets (1,000 mg total) by mouth every 8 (eight) hours as needed. 07/09/22   Holley Dexter, PA-C  aspirin EC 325 MG tablet Take 1 tablet (325 mg total) by mouth 2 (two) times daily for 21 days. 07/09/22 07/30/22  Holley Dexter, PA-C  cholecalciferol (VITAMIN D3) 25 MCG (1000 UT) tablet Take 1,000 Units by mouth daily.    [provider]  Cyanocobalamin (B-12) 5000 MCG CAPS Take 5,000 mcg by mouth daily.    [provider]  EPINEPHrine 0.3 mg/0.3 mL IJ SOAJ injection Inject 0.3 mg into the muscle as needed for anaphylaxis. 01/13/19   [provider]  famotidine (PEPCID) 20 MG tablet Take 20 mg by mouth daily.    [provider]  glucosamine-chondroitin 500-400 MG tablet Take 1 tablet by mouth daily.    [provider]  hydrochlorothiazide (MICROZIDE) 12.5 MG capsule Take 12.5 mg by mouth daily.    [provider]   latanoprost (XALATAN) 0.005 % ophthalmic solution Place 1 drop into both eyes at bedtime.    [provider]  levothyroxine (SYNTHROID, LEVOTHROID) 100 MCG tablet Take 100 mcg by mouth daily.      [provider]  lithium carbonate 150 MG capsule Take 1 capsule (150 mg total) by mouth at bedtime. 06/20/22   Cottle, Billey Co., MD  loratadine (CLARITIN) 10 MG tablet Take 10 mg by mouth daily as needed for allergies.    [provider]  Magnesium 250 MG TABS Take 250 mg by mouth daily.    [provider]  Melatonin 5 MG CHEW Chew 10 mg by mouth at bedtime. Gummy tabs    [provider]  methocarbamol (ROBAXIN) 500 MG tablet Take 1 tablet (500 mg total) by mouth every 6 (six) hours as needed for muscle spasms. 07/09/22   Holley Dexter, PA-C  mupirocin ointment (BACTROBAN) 2 % Apply 1 Application topically daily. 06/25/22   [provider]  nebivolol (BYSTOLIC) 5 MG tablet Take 5 mg by mouth daily.    [provider]  Omega 3 1200 MG CAPS Take 1,200 mg by mouth daily.    [provider]  OVER THE COUNTER MEDICATION Take 2 tablets by mouth daily. Neuriva Plus    [provider]  oxyCODONE (OXY IR/ROXICODONE) 5 MG immediate release tablet Take 1-2 tablets (5-10 mg total) by mouth every 6 (six) hours as needed for severe pain. 07/09/22  Holley Dexter, PA-C  pyridOXINE (VITAMIN B-6) 100 MG tablet Take 100 mg by mouth daily.    [provider]  REPATHA SURECLICK 401 MG/ML SOAJ Inject 140 mg into the skin every 14 (fourteen) days. 01/24/20   [provider]  Vilazodone HCl 20 MG TABS Take 1 tablet (20 mg total) by mouth daily at 12 noon. Patient taking differently: Take 20 mg by mouth daily at 12 noon. Pt takes at 8am 06/20/22   Cottle, Billey Co., MD      Allergies    Sulfa antibiotics    Review of Systems   Review of Systems  Constitutional:  Negative for appetite change and fatigue.   HENT:  Negative for congestion, ear discharge and sinus pressure.   Eyes:  Negative for discharge.  Respiratory:  Negative for cough.   Cardiovascular:  Negative for chest pain.  Gastrointestinal:  Positive for vomiting. Negative for abdominal pain and diarrhea.  Genitourinary:  Negative for frequency and hematuria.  Musculoskeletal:  Negative for back pain.  Skin:  Negative for rash.  Neurological:  Negative for seizures and headaches.  Psychiatric/Behavioral:  Negative for hallucinations.     Physical Exam Updated Vital Signs BP (!) 132/56   Pulse 83   Temp 98 F (36.7 C) (Oral)   Resp 13   SpO2 92%  Physical Exam Vitals and nursing note reviewed.  Constitutional:      Appearance: He is well-developed.  HENT:     Head: Normocephalic.     Nose: Nose normal.  Eyes:     General: No scleral icterus.    Conjunctiva/sclera: Conjunctivae normal.  Neck:     Thyroid: No thyromegaly.  Cardiovascular:     Rate and Rhythm: Normal rate and regular rhythm.     Heart sounds: No murmur heard.    No friction rub. No gallop.  Pulmonary:     Breath sounds: No stridor. No wheezing or rales.  Chest:     Chest wall: No tenderness.  Abdominal:     General: There is no distension.     Tenderness: There is no abdominal tenderness. There is no rebound.  Genitourinary:    Comments: Rectal heme-negative Musculoskeletal:        General: Normal range of motion.     Cervical back: Neck supple.  Lymphadenopathy:     Cervical: No cervical adenopathy.  Skin:    Findings: No erythema or rash.  Neurological:     Mental Status: He is alert and oriented to person, place, and time.     Motor: No abnormal muscle tone.     Coordination: Coordination normal.  Psychiatric:        Behavior: Behavior normal.     ED Results / Procedures / Treatments   Labs (all labs ordered are listed, but only abnormal results are displayed) Labs Reviewed  CBC WITH DIFFERENTIAL/PLATELET - Abnormal; Notable  for the following components:      Result Value   WBC 18.8 (*)    RBC 3.32 (*)    Hemoglobin 10.5 (*)    HCT 30.2 (*)    Neutro Abs 16.1 (*)    Monocytes Absolute 1.1 (*)    Abs Immature Granulocytes 0.10 (*)    All other components within normal limits  COMPREHENSIVE METABOLIC PANEL - Abnormal; Notable for the following components:   Sodium 124 (*)    Chloride 92 (*)    CO2 20 (*)    Glucose, Bld 125 (*)  BUN 80 (*)    Creatinine, Ser 3.03 (*)    Calcium 8.0 (*)    Total Protein 6.0 (*)    Albumin 2.8 (*)    GFR, Estimated 20 (*)    All other components within normal limits  URINALYSIS, ROUTINE W REFLEX MICROSCOPIC - Abnormal; Notable for the following components:   Hgb urine dipstick LARGE (*)    Protein, ur 30 (*)    Bacteria, UA RARE (*)    All other components within normal limits  POC OCCULT BLOOD, ED  TYPE AND SCREEN    EKG None  Radiology No results found.  Procedures Procedures    Medications Ordered in ED Medications  sodium chloride 0.9 % bolus 1,000 mL (1,000 mLs Intravenous Bolus 07/11/22 2210)  pantoprazole (PROTONIX) injection 40 mg (40 mg Intravenous Given 07/11/22 2211)  ondansetron (ZOFRAN) injection 4 mg (4 mg Intravenous Given 07/11/22 2211)    ED Course/ Medical Decision Making/ A&P   This patient presents to the ED for concern of vomiting up black material, this involves an extensive number of treatment options, and is a complaint that carries with it a high risk of complications and morbidity.  The differential diagnosis includes upper GI bleed   Co morbidities that complicate the patient evaluation  Recent knee surgery   Additional history obtained:  Additional history obtained from wife External records from outside source obtained and reviewed including hospital records   Lab Tests:  I Ordered, and personally interpreted labs.  The pertinent results include: White count 18,000, hemoglobin 10.5 which is 3 g lower than a couple  days ago   Imaging Studies ordered:  No imaging Cardiac Monitoring: / EKG:  The patient was maintained on a cardiac monitor.  I personally viewed and interpreted the cardiac monitored which showed an underlying rhythm of: nsr   Consultations Obtained:  I requested consultation with the hospitalist and GI,  and discussed lab and imaging findings as well as pertinent plan - they recommend: Admit to hospitalist with GI consult   Problem List / ED Course / Critical interventions / Medication management  Vomiting blood, knee replacement I ordered medication including Protonix Reevaluation of the patient after these medicines showed that the patient improved I have reviewed the patients home medicines and have made adjustments as needed   Social Determinants of Health:  None   Test / Admission - Considered:  None    I spoke with [facility] and they will consult tomorrow.  Patient needs to be n.p.o. and given Protonix.  No imaging was needed according to gastroenterologist                           Medical Decision Making Amount and/or Complexity of Data Reviewed Labs: ordered.  Risk Prescription drug management. Decision regarding hospitalization.   Patient will be admitted for AKI and upper GI bleed        Final Clinical Impression(s) / ED Diagnoses Final diagnoses:  AKI (acute kidney injury) ([place])  Upper GI bleed    Rx / DC Orders ED Discharge Orders     None         Milton Ferguson, MD 07/12/22 [phone]

## 2022-07-11 NOTE — H&P (Signed)
History and Physical    Jake Collins:[id number] DOB: 1946/02/26 DOA: 07/11/2022  PCP: Alroy Dust, L.Marlou Sa, MD  Patient coming from: Home  I have personally briefly reviewed patient's old medical records in [place]  Chief Complaint: Coffee-ground emesis  HPI: Jake Collins is a 77 y.o. male with medical history significant for HTN, HLD, hypothyroidism, GAD, depression, OA s/p right TKA 07/08/2022 who presented to the ED for evaluation of coffee-ground emesis.  History is supplemented by spouse at bedside due to encephalopathy.  Patient recently underwent right TKA on 07/08/2022 by orthopedics, Dr. Wynelle Link.  EBL documented as 25 mL.  He was discharged on 1/30 with postop hemoglobin of 13.8.  WBC was 18.0. He was discharged on aspirin 325 mg BID for VTE prophylaxis.  He has not been using NSAIDs.  Patient states that he had an episode of dark coffee-ground appearing emesis on the day it returned home and then again earlier today.  Per spouse he was experiencing some abdominal pain earlier but not currently.  Spouse is also seen similar appearing dark substance in his briefs.  Patient states that he has had decreased urine output than expected.  He has not been eating or drinking much.  Last bowel movement was 4 days prior to admission.  He has been more fatigued and "loopy" since leaving the hospital.  This is a significant change from his baseline per spouse.  They have held his oxycodone and Robaxin due to concern for medication effect.  Due to recurrent emesis and persistent symptoms he was brought to the ED for further evaluation.  ED Course  Labs/Imaging on admission: I have personally reviewed following labs and imaging studies.  Initial vitals showed BP 131/66, pulse 79, RR 18, temp 98.0 F, SpO2 94% on room air.  Labs show sodium 124 (138 on 1/30), potassium 3.5, bicarb 20, BUN 80 and creatinine 3.03 (BUN 19 and creatinine 1.23 on 1/30), LFTs within normal limits, WBC  18.8, hemoglobin 10.5 (13.8 on 1/30), platelets 164,000.  UA shows negative nitrites, negative leukocytes, 21-50 RBCs, 0-5 WBCs, rare bacteria.  Patient was given 1 L normal saline, IV Protonix 40 mg, IV Zofran.  EDP discussed with on-call [facility] who recommended keeping n.p.o. Foley catheter was placed in the ED.  The hospitalist service was consulted to admit for further evaluation and management.  Review of Systems: All systems reviewed and are negative except as documented in history of present illness above.   Past Medical History:  Diagnosis Date   Arthritis    Cancer ([place])    skin melanoma   Depression    Diverticulitis    2 bouts in 10 years   Hyperlipemia    Hypertension    Hypothyroid    Scoliosis    Thyroid disease     Past Surgical History:  Procedure Laterality Date   APPENDECTOMY     CATARACT EXTRACTION W/ INTRAOCULAR LENS IMPLANT Bilateral    COLONOSCOPY     EYE SURGERY     HERNIA REPAIR     KNEE ARTHROSCOPY Right     x2   KNEE SURGERY Left    LAPAROSCOPIC APPENDECTOMY  05/28/2011   Procedure: APPENDECTOMY LAPAROSCOPIC;  Surgeon: Edward Jolly, MD;  Location: [facility];  Service: General;  Laterality: N/A;   PAROTID GLAND TUMOR EXCISION     ROOT CANAL     ROTATOR CUFF REPAIR     bil    SALIVARY GLAND SURGERY     L  side   SKIN CANCER EXCISION     on his back and was benign   TOTAL HIP ARTHROPLASTY Left 04/14/2019   Procedure: TOTAL HIP ARTHROPLASTY ANTERIOR APPROACH;  Surgeon: Gaynelle Arabian, MD;  Location: [facility];  Service: Orthopedics;  Laterality: Left;  161mn   TOTAL KNEE ARTHROPLASTY Right 07/08/2022   Procedure: TOTAL KNEE ARTHROPLASTY;  Surgeon: AGaynelle Arabian MD;  Location: [facility];  Service: Orthopedics;  Laterality: Right;    Social History:  reports that he has never smoked. He has never used smokeless tobacco. He reports that he does not drink alcohol and does not use drugs.  Allergies  Allergen Reactions   Sulfa Antibiotics      SKatherina Rightsyndrome     Family History  Problem Relation Age of Onset   Heart disease Father      Prior to Admission medications   Medication Sig Start Date End Date Taking? Authorizing Provider  acetaminophen (TYLENOL) 500 MG tablet Take 2 tablets (1,000 mg total) by mouth every 8 (eight) hours as needed. 07/09/22   EHolley Dexter PA-C  aspirin EC 325 MG tablet Take 1 tablet (325 mg total) by mouth 2 (two) times daily for 21 days. 07/09/22 07/30/22  EHolley Dexter PA-C  cholecalciferol (VITAMIN D3) 25 MCG (1000 UT) tablet Take 1,000 Units by mouth daily.    [provider]  Cyanocobalamin (B-12) 5000 MCG CAPS Take 5,000 mcg by mouth daily.    [provider]  EPINEPHrine 0.3 mg/0.3 mL IJ SOAJ injection Inject 0.3 mg into the muscle as needed for anaphylaxis. 01/13/19   [provider]  famotidine (PEPCID) 20 MG tablet Take 20 mg by mouth daily.    [provider]  glucosamine-chondroitin 500-400 MG tablet Take 1 tablet by mouth daily.    [provider]  hydrochlorothiazide (MICROZIDE) 12.5 MG capsule Take 12.5 mg by mouth daily.    [provider]  latanoprost (XALATAN) 0.005 % ophthalmic solution Place 1 drop into both eyes at bedtime.    [provider]  levothyroxine (SYNTHROID, LEVOTHROID) 100 MCG tablet Take 100 mcg by mouth daily.      [provider]  lithium carbonate 150 MG capsule Take 1 capsule (150 mg total) by mouth at bedtime. 06/20/22   Cottle, CBilley Co, MD  loratadine (CLARITIN) 10 MG tablet Take 10 mg by mouth daily as needed for allergies.    [provider]  Magnesium 250 MG TABS Take 250 mg by mouth daily.    [provider]  Melatonin 5 MG CHEW Chew 10 mg by mouth at bedtime. Gummy tabs    [provider]  methocarbamol (ROBAXIN) 500 MG tablet Take 1 tablet (500 mg total) by mouth every 6 (six) hours as needed for muscle spasms. 07/09/22   EHolley Dexter PA-C  mupirocin ointment (BACTROBAN) 2 % Apply 1 Application topically daily. 06/25/22   [provider]  nebivolol (BYSTOLIC) 5 MG tablet Take 5 mg by mouth daily.    [provider]  Omega 3 1200 MG CAPS Take 1,200 mg by mouth daily.    [provider]  OVER THE COUNTER MEDICATION Take 2 tablets by mouth daily. Neuriva Plus    [provider]  oxyCODONE (OXY IR/ROXICODONE) 5 MG immediate release tablet Take 1-2 tablets (5-10 mg total) by mouth every 6 (six) hours as needed for severe pain. 07/09/22   EHolley Dexter PA-C  pyridOXINE (VITAMIN B-6) 100 MG tablet  Take 100 mg by mouth daily.    [provider]  REPATHA SURECLICK 166 MG/ML SOAJ Inject 140 mg into the skin every 14 (fourteen) days. 01/24/20   [provider]  Vilazodone HCl 20 MG TABS Take 1 tablet (20 mg total) by mouth daily at 12 noon. Patient taking differently: Take 20 mg by mouth daily at 12 noon. Pt takes at 8am 06/20/22   Cottle, Billey Co., MD    Physical Exam: Vitals:   07/11/22 2230 07/11/22 2300 07/11/22 2330 07/12/22 0000  BP: (!) 143/63 (!) 132/56 (!) 127/50 (!) 131/51  Pulse: 77 83 86 89  Resp: '17 13 14 '$ (!) 24  Temp:      TempSrc:      SpO2: 95% 92% (!) 89% 93%   Constitutional: Resting supine in bed, appears fatigued, calm, comfortable Eyes: EOMI, lids and conjunctivae normal ENMT: Mucous membranes are dry. Posterior pharynx clear of any exudate or lesions.Normal dentition.  Neck: normal, supple, no masses. Respiratory: clear to auscultation bilaterally, no wheezing, no crackles. Normal respiratory effort. No accessory muscle use.  Cardiovascular: Regular rate and rhythm, no murmurs / rubs / gallops. No extremity edema. 2+ pedal pulses. Abdomen: no tenderness, no masses palpated. Musculoskeletal: S/p right TKA, surgical site without evidence of active infection, swelling noted around right knee and distally Skin: Post surgical  right knee incision Neurologic:  Sensation intact. Strength slightly limited at right knee due to postop status otherwise intact other extremities Psychiatric: Alert and oriented to person, place, time but some inconsistent history regarding situation.  EKG: Personally reviewed. 07/01/2022: Sinus bradycardia, rate 55, nonspecific IVCD, LAFB.  Assessment/Plan Principal Problem:   Coffee ground emesis Active Problems:   AKI (acute kidney injury) ([place])   Hypothyroid   Depression   GAD (generalized anxiety disorder)   History of total knee arthroplasty, right   Normocytic anemia   Leukocytosis   Acute metabolic encephalopathy   Essential hypertension   Jake Collins is a 77 y.o. male with medical history significant for HTN, HLD, hypothyroidism, GAD, depression, OA s/p right TKA 07/08/2022 who is admitted with coffee-ground emesis, AKI, acute hyponatremia, and encephalopathy.  Assessment and Plan: Coffee-ground emesis with acute anemia: Patient reports 2 episodes of dark coffee-ground emesis at home.  Has been on ASA 325 mg BID for only a couple days after recent right TKA.  Hemoglobin 13.8 >> 10.5 over 48-hour period.  EDP spoke with on-call [facility], Dr. Alessandra Bevels, and they will consult in the morning. -Keep n.p.o., hold aspirin -Continue IV Protonix 40 mg BID -Repeat CBC in a.m. -GI to follow  Acute kidney injury: BUN 80 and creatinine 3.03 compared to BUN 19/creatinine 1.23 two days prior to admission.  Has not been on NSAIDs.  Suspect urinary retention/bladder outlet obstruction.  Foley catheter placed in the ED, UOP not documented. -Renal US shows decompressed bladder by Foley catheter, mild right hydronephrosis, fullness of left renal collecting system -Gentle IV fluid hydration overnight -Monitor UOP and repeat labs in a.m. -Holding HCTZ  Hyponatremia: Acute hyponatremia with sodium down to 124 compared to 138 on 1/30.  Could be from poor oral intake/hypovolemia or  medication effect from HCTZ, lithium, Vilazodone, SIADH. -Obtain urine sodium and osmolality, serum osmolality -Holding HCTZ, lithium, Vilazodone -IV NS'@75'$  mL/hour -Repeat BMP in a.m. and DC or adjust fluids based on trend  Acute metabolic encephalopathy: Significant change from baseline per spouse.  May be multifactorial related to uremia, hyponatremia, medication effect.  Continue management as above,  continue to hold Robaxin and oxycodone.  Leukocytosis: WBC 18, similar to postop levels.  Suspect reactive after recent surgery.  Continue to monitor.  S/p right TKA 07/08/2022: Performed by Dr. Wynelle Link.  Expected postoperative changes noted at knee.  Continue to monitor for postop complication.  SCDs for VTE prophylaxis since ASA is on hold as above.  Hypertension: Holding HCTZ with AKI, holding Bystolic with low normal BP.  Hypothyroidism: Continue Synthroid.  GAD/depression: Has been on lithium chronically, however on hold tonight due to renal dysfunction and hyponatremia.  Resume as soon as able. Vilazodone also held tonight with hyponatremia.   DVT prophylaxis: SCDs Start: 07/11/22 2354 Code Status: Full code, discussed with family on admission Family Communication: Spouse at bedside Disposition Plan: From home, dispo pending clinical progress Consults called: [facility] Severity of Illness: The appropriate patient status for this patient is INPATIENT. Inpatient status is judged to be reasonable and necessary in order to provide the required intensity of service to ensure the patient's safety. The patient's presenting symptoms, physical exam findings, and initial radiographic and laboratory data in the context of their chronic comorbidities is felt to place them at high risk for further clinical deterioration. Furthermore, it is not anticipated that the patient will be medically stable for discharge from the hospital within 2 midnights of admission.   * I certify that at the point of  admission it is my clinical judgment that the patient will require inpatient hospital care spanning beyond 2 midnights from the point of admission due to high intensity of service, high risk for further deterioration and high frequency of surveillance required.Zada Finders MD [facility]  If 7PM-7AM, please contact night-coverage [email or web address]  07/12/2022, 12:50 AM

## 2022-07-12 ENCOUNTER — Inpatient Hospital Stay (HOSPITAL_COMMUNITY): Payer: PPO

## 2022-07-12 ENCOUNTER — Inpatient Hospital Stay (HOSPITAL_COMMUNITY): Payer: PPO | Admitting: Certified Registered Nurse Anesthetist

## 2022-07-12 ENCOUNTER — Encounter (HOSPITAL_COMMUNITY)
Admission: EM | Disposition: A | Discharge status: Home / Self Care | Payer: Self-pay | Source: Home / Self Care | Attending: Family Medicine

## 2022-07-12 ENCOUNTER — Encounter (HOSPITAL_COMMUNITY): Payer: Self-pay | Admitting: Internal Medicine

## 2022-07-12 ENCOUNTER — Other Ambulatory Visit (HOSPITAL_COMMUNITY): Payer: PPO

## 2022-07-12 DIAGNOSIS — K209 Esophagitis, unspecified without bleeding: Secondary | ICD-10-CM | POA: Diagnosis not present

## 2022-07-12 DIAGNOSIS — I499 Cardiac arrhythmia, unspecified: Secondary | ICD-10-CM

## 2022-07-12 DIAGNOSIS — Z96651 Presence of right artificial knee joint: Secondary | ICD-10-CM

## 2022-07-12 DIAGNOSIS — D649 Anemia, unspecified: Secondary | ICD-10-CM | POA: Diagnosis present

## 2022-07-12 DIAGNOSIS — E871 Hypo-osmolality and hyponatremia: Secondary | ICD-10-CM

## 2022-07-12 DIAGNOSIS — E039 Hypothyroidism, unspecified: Secondary | ICD-10-CM

## 2022-07-12 DIAGNOSIS — D72829 Elevated white blood cell count, unspecified: Secondary | ICD-10-CM | POA: Diagnosis present

## 2022-07-12 DIAGNOSIS — K922 Gastrointestinal hemorrhage, unspecified: Secondary | ICD-10-CM | POA: Diagnosis not present

## 2022-07-12 DIAGNOSIS — I10 Essential (primary) hypertension: Secondary | ICD-10-CM

## 2022-07-12 DIAGNOSIS — G9341 Metabolic encephalopathy: Secondary | ICD-10-CM | POA: Diagnosis present

## 2022-07-12 DIAGNOSIS — N179 Acute kidney failure, unspecified: Principal | ICD-10-CM | POA: Diagnosis present

## 2022-07-12 DIAGNOSIS — D638 Anemia in other chronic diseases classified elsewhere: Secondary | ICD-10-CM

## 2022-07-12 HISTORY — PX: ESOPHAGOGASTRODUODENOSCOPY: SHX5428

## 2022-07-12 LAB — BASIC METABOLIC PANEL
Anion gap: 9 (ref 5–15)
BUN: 62 mg/dL — ABNORMAL HIGH (ref 8–23)
CO2: 20 mmol/L — ABNORMAL LOW (ref 22–32)
Calcium: 8.2 mg/dL — ABNORMAL LOW (ref 8.9–10.3)
Chloride: 105 mmol/L (ref 98–111)
Creatinine, Ser: 2.02 mg/dL — ABNORMAL HIGH (ref 0.61–1.24)
GFR, Estimated: 33 mL/min — ABNORMAL LOW (ref >60)
Glucose, Bld: 103 mg/dL — ABNORMAL HIGH (ref 70–99)
Potassium: 3.2 mmol/L — ABNORMAL LOW (ref 3.5–5.1)
Sodium: 134 mmol/L — ABNORMAL LOW (ref 135–145)

## 2022-07-12 LAB — CBC
HCT: 26.7 % — ABNORMAL LOW (ref 39.0–52.0)
HCT: 25.4 % — ABNORMAL LOW (ref 39.0–52.0)
Hemoglobin: 8.9 g/dL — ABNORMAL LOW (ref 13.0–17.0)
Hemoglobin: 8.6 g/dL — ABNORMAL LOW (ref 13.0–17.0)
MCH: 31.8 pg (ref 26.0–34.0)
MCH: 31.7 pg (ref 26.0–34.0)
MCHC: 33.3 g/dL (ref 30.0–36.0)
MCHC: 33.9 g/dL (ref 30.0–36.0)
MCV: 95.4 fL (ref 80.0–100.0)
MCV: 93.7 fL (ref 80.0–100.0)
Platelets: 143 10*3/uL — ABNORMAL LOW (ref 150–400)
Platelets: 159 10*3/uL (ref 150–400)
RBC: 2.8 MIL/uL — ABNORMAL LOW (ref 4.22–5.81)
RBC: 2.71 MIL/uL — ABNORMAL LOW (ref 4.22–5.81)
RDW: 12.9 % (ref 11.5–15.5)
RDW: 13.2 % (ref 11.5–15.5)
WBC: 14.3 10*3/uL — ABNORMAL HIGH (ref 4.0–10.5)
WBC: 10.9 10*3/uL — ABNORMAL HIGH (ref 4.0–10.5)
nRBC: 0 % (ref 0.0–0.2)
nRBC: 0 % (ref 0.0–0.2)

## 2022-07-12 LAB — SODIUM, URINE, RANDOM: Sodium, Ur: <10 mmol/L

## 2022-07-12 LAB — OSMOLALITY, URINE: Osmolality, Ur: 232 mOsm/kg — ABNORMAL LOW (ref 300–900)

## 2022-07-12 LAB — OSMOLALITY: Osmolality: 299 mOsm/kg — ABNORMAL HIGH (ref 275–295)

## 2022-07-12 SURGERY — EGD (ESOPHAGOGASTRODUODENOSCOPY)
Anesthesia: Monitor Anesthesia Care

## 2022-07-12 MED ORDER — SODIUM CHLORIDE 0.9 % IV SOLN: INTRAVENOUS | Status: DC

## 2022-07-12 MED ORDER — SENNA 8.6 MG PO TABS
1.0000 | ORAL_TABLET | Freq: Every day | ORAL | Status: DC
  Administered 2022-07-12 – 2022-07-14 (×3): 8.6 mg via ORAL
  Filled 2022-07-12 (×3): qty 1

## 2022-07-12 MED ORDER — PROPOFOL 500 MG/50ML IV EMUL
INTRAVENOUS | Status: AC
  Filled 2022-07-12: qty 50

## 2022-07-12 MED ORDER — LACTATED RINGERS IV SOLN: INTRAVENOUS | Status: DC | PRN

## 2022-07-12 MED ORDER — POLYETHYLENE GLYCOL 3350 17 G PO PACK
17.0000 g | PACK | Freq: Two times a day (BID) | ORAL | Status: DC
  Administered 2022-07-12 – 2022-07-15 (×6): 17 g via ORAL
  Filled 2022-07-12 (×6): qty 1

## 2022-07-12 MED ORDER — PROPOFOL 10 MG/ML IV BOLUS
INTRAVENOUS | Status: DC | PRN
  Administered 2022-07-12: 20 mg via INTRAVENOUS

## 2022-07-12 MED ORDER — PROPOFOL 500 MG/50ML IV EMUL
INTRAVENOUS | Status: DC | PRN
  Administered 2022-07-12: 125 ug/kg/min via INTRAVENOUS

## 2022-07-12 MED ORDER — PROPOFOL 10 MG/ML IV BOLUS
INTRAVENOUS | Status: AC
  Filled 2022-07-12: qty 20

## 2022-07-12 MED ORDER — LEVOTHYROXINE SODIUM 100 MCG PO TABS
100.0000 ug | ORAL_TABLET | Freq: Every day | ORAL | Status: DC
  Administered 2022-07-13 – 2022-07-15 (×3): 100 ug via ORAL
  Filled 2022-07-12 (×4): qty 1

## 2022-07-12 MED ORDER — SODIUM CHLORIDE 0.9 % IV SOLN
INTRAVENOUS | Status: DC
  Administered 2022-07-12: 500 mL via INTRAVENOUS

## 2022-07-12 MED ORDER — MELATONIN 5 MG PO TABS
10.0000 mg | ORAL_TABLET | Freq: Every day | ORAL | Status: DC
Start: 2022-07-12 — End: 2022-07-15
  Administered 2022-07-12 – 2022-07-14 (×4): 10 mg via ORAL
  Filled 2022-07-12 (×4): qty 2

## 2022-07-12 MED ORDER — POTASSIUM CHLORIDE CRYS ER 20 MEQ PO TBCR
40.0000 meq | EXTENDED_RELEASE_TABLET | Freq: Once | ORAL | Status: AC
  Administered 2022-07-12: 40 meq via ORAL
  Filled 2022-07-12: qty 2

## 2022-07-12 MED ORDER — CHLORHEXIDINE GLUCONATE CLOTH 2 % EX PADS
6.0000 | MEDICATED_PAD | Freq: Every day | CUTANEOUS | Status: DC
  Administered 2022-07-12 – 2022-07-13 (×2): 6 via TOPICAL

## 2022-07-12 NOTE — Hospital Course (Addendum)
77 year old man status post right total knee arthroplasty on 1/29 who presented with coffee-ground emesis.  Admitted for same, AKI, acute hyponatremia and encephalopathy.

## 2022-07-12 NOTE — Anesthesia Procedure Notes (Signed)
Procedure Name: MAC Date/Time: 07/12/2022 2:10 PM  Performed by: Claudia Desanctis, CRNAPre-anesthesia Checklist: Patient identified, Emergency Drugs available, Suction available and Patient being monitored Patient Re-evaluated:Patient Re-evaluated prior to induction Oxygen Delivery Method: Simple face mask

## 2022-07-12 NOTE — Op Note (Signed)
[facility] Patient Name: Jake Collins Procedure Date: 07/12/2022 MRN: [id number] Attending MD: Clarene Essex , MD, [id number] Date of Birth: 1946/01/24 CSN: [id number] Age: 77 Admit Type: Outpatient Procedure:                Upper GI endoscopy Indications:              Acute post hemorrhagic anemia, Coffee-ground emesis Providers:                Clarene Essex, MD, Dulcy Fanny, Cherylynn Ridges,                            Technician, Dellie Catholic Referring MD:              Medicines:                Monitored Anesthesia Care Complications:            No immediate complications. Estimated Blood Loss:     Estimated blood loss: none. Procedure:                Pre-Anesthesia Assessment:                           - Prior to the procedure, a History and Physical                            was performed, and patient medications and                            allergies were reviewed. The patient's tolerance of                            previous anesthesia was also reviewed. The risks                            and benefits of the procedure and the sedation                            options and risks were discussed with the patient.                            All questions were answered, and informed consent                            was obtained. Prior Anticoagulants: The patient has                            taken no anticoagulant or antiplatelet agents                            except for aspirin. ASA Grade Assessment: II - A                            patient with mild systemic disease. After reviewing  the risks and benefits, the patient was deemed in                            satisfactory condition to undergo the procedure.                           After obtaining informed consent, the endoscope was                            passed under direct vision. Throughout the                            procedure, the patient's blood pressure, pulse, and                             oxygen saturations were monitored continuously. The                            [id number]-H190 ([id number]) Olympus endoscope was introduced                            through the mouth, and advanced to the third part                            of duodenum. The upper GI endoscopy was                            accomplished without difficulty. The patient                            tolerated the procedure well. Scope In: Scope Out: Findings:      The larynx was normal.      A small hiatal hernia was present.      Moderately severe esophagitis with no bleeding was found.      The entire examined stomach was normal.      The duodenal bulb, first portion of the duodenum, second portion of the       duodenum and third portion of the duodenum were normal.      The exam was otherwise without abnormality. Without any active bleeding Impression:               - Normal larynx.                           - Small hiatal hernia.                           - Moderately severe reflux esophagitis with no                            bleeding.                           - Normal stomach.                           -  Normal duodenal bulb, first portion of the                            duodenum, second portion of the duodenum and third                            portion of the duodenum.                           - The examination was otherwise normal.                           - No specimens collected. Moderate Sedation:      Not Applicable - Patient had care per Anesthesia. Recommendation:           - Full liquid diet today. May slowly advance                            tomorrow if doing well and use antireflux measures                           - Continue present medications. Use pantoprazole                            instead of his Pepcid at home twice a day for a                            month then can decrease to once a day and may                            resume aspirin in 2  days                           - Return to GI clinic in 1 month.                           - Telephone GI clinic if symptomatic PRN.                           - Repeat upper endoscopy in 3 months to check                            healing.                           - Check CBC and basic metabolic profile in the                            morning. Procedure Code(s):        --- Professional ---                           [phone], Esophagogastroduodenoscopy, flexible,  transoral; diagnostic, including collection of                            specimen(s) by brushing or washing, when performed                            (separate procedure) Diagnosis Code(s):        --- Professional ---                           K44.9, Diaphragmatic hernia without obstruction or                            gangrene                           K21.00, Gastro-esophageal reflux disease with                            esophagitis, without bleeding                           D62, Acute posthemorrhagic anemia                           K92.0, Hematemesis CPT copyright 2022 American Medical Association. All rights reserved. The codes documented in this report are preliminary and upon coder review may  be revised to meet current compliance requirements. Clarene Essex, MD 07/12/2022 2:45:44 PM This report has been signed electronically. Number of Addenda: 0

## 2022-07-12 NOTE — Progress Notes (Signed)
Progress Note   Patient: Jake Collins SWF:[id number] DOB: 07-26-1945 DOA: 07/11/2022     1 DOS: the patient was seen and examined on 07/12/2022   Brief hospital course: 77 year old man status post right total knee arthroplasty on 1/29 who presented with coffee-ground emesis.  Admitted for same, AKI, acute hyponatremia and encephalopathy.  Assessment and Plan: UGBI with coffee ground emesis with ABLA Episodes prior to admission.  Has been on ASA 325 mg BID for only a couple days after recent right TKA.  Hemoglobin 13.8 >> 10.5 over 48-hour period.  Aspirin on hold.  Continue Protonix.  Status post EGD showing moderately severe reflux esophagitis with no bleeding. Hemoglobin appears to be stabilizing now at 8.6.  Continue serial CBC. Plan for full liquid diet today and slowly advance tomorrow.  PPI twice daily for 1 month and then once a day thereafter.  Can resume aspirin in 2 days.  Return to GI clinic in 1 month.   Acute kidney injury: BUN 80 and creatinine 3.03 compared to BUN 19/creatinine 1.23 two days prior to admission.  Suspect prerenal and postrenal based on history of poor oral intake and ultrasound findings suggesting chronic bladder outlet obstruction.. Creatinine improving.  Continue IV fluids.  Trend BMP.   Hyponatremia: Acute hyponatremia with sodium down to 124 compared to 138 on 1/30.  Multifactorial but probably mostly from poor oral intake and diuretic.   Stop IV fluids.  Sodium now up to 134.   Acute metabolic encephalopathy: Favor delirium. Was present on discharge per report.  May be multifactorial related to uremia, hyponatremia, medication effect.   Expect to slowly clear.  Leukocytosis: WBC 18, similar to postop levels.  Suspect reactive after recent surgery.  Now trending down, near normal.   S/p right TKA 07/08/2022: Performed by Dr. Wynelle Link.  Expected postoperative changes noted at knee.  Continue to monitor for postop complication.  SCDs for VTE prophylaxis  since ASA is on hold as above.   Essential hypertension: Stable.  Holding HCTZ with AKI, holding Bystolic with low normal BP.   Hypothyroidism: Continue Synthroid.   GAD/depression: Has been on lithium chronically, however on hold tonight due to renal dysfunction and hyponatremia.  Resume as soon as able. Vilazodone also held tonight with hyponatremia.  Constipation seen on AXR Bowel regimen      Subjective:  Feels ok Confused a bit No real pain Drinking little at home Was confused on last discharge  Physical Exam: Vitals:   07/12/22 1454 07/12/22 1500 07/12/22 1509 07/12/22 1656  BP: (!) 124/51  130/61 112/61  Pulse: 90  89 97  Resp: '18 20 18 20  '$ Temp:   98.8 F (37.1 C) 99.1 F (37.3 C)  TempSrc:   Oral Oral  SpO2: 95%  95% 94%   Physical Exam Vitals reviewed.  Constitutional:      General: He is not in acute distress.    Appearance: He is not ill-appearing or toxic-appearing.  Cardiovascular:     Rate and Rhythm: Normal rate and regular rhythm.     Heart sounds: No murmur heard. Pulmonary:     Effort: Pulmonary effort is normal. No respiratory distress.     Breath sounds: No wheezing, rhonchi or rales.  Neurological:     Mental Status: He is alert.  Psychiatric:        Mood and Affect: Mood normal.        Behavior: Behavior normal.     Data Reviewed: K+ 3.2 Creatinine 3.03 > 2.02  WBC 18.8 > 14.3 Hgb 10.5 > 8.9 Plts 164 > 143 Renal u/s Mild right hydronephrosis. Fullness of the left renal collecting system without frank hydronephrosis. While nonspecific, these findings raise the possibility of chronic bladder outlet obstruction.  Family Communication: wife and son at bedside  Disposition: Status is: Inpatient Remains inpatient appropriate because: UGIB  Planned Discharge Destination: Home with Home Health    Time spent: 40 minutes  Author: Murray Hodgkins, MD 07/12/2022 6:11 PM  For on call review [email or web address].

## 2022-07-12 NOTE — Anesthesia Preprocedure Evaluation (Addendum)
Anesthesia Evaluation  Patient identified by MRN, date of birth, ID band Patient awake    Reviewed: Allergy & Precautions, H&P , NPO status , Patient's Chart, lab work & pertinent test results  Airway Mallampati: II  TM Distance: >3 FB Neck ROM: Full    Dental   Pulmonary neg pulmonary ROS   breath sounds clear to auscultation       Cardiovascular hypertension, Pt. on medications + dysrhythmias  Rhythm:Regular Rate:Normal     Neuro/Psych negative neurological ROS     GI/Hepatic Neg liver ROS,,,Coffee ground emesis    Endo/Other  Hypothyroidism    Renal/GU Renal disease     Musculoskeletal  (+) Arthritis ,    Abdominal   Peds  Hematology  (+) Blood dyscrasia, anemia   Anesthesia Other Findings   Reproductive/Obstetrics                             Anesthesia Physical Anesthesia Plan  ASA: 4  Anesthesia Plan: MAC   Post-op Pain Management:    Induction:   PONV Risk Score and Plan: 1 and Propofol infusion  Airway Management Planned: Natural Airway and Nasal Cannula  Additional Equipment:   Intra-op Plan:   Post-operative Plan:   Informed Consent: I have reviewed the patients History and Physical, chart, labs and discussed the procedure including the risks, benefits and alternatives for the proposed anesthesia with the patient or authorized representative who has indicated his/her understanding and acceptance.       Plan Discussed with: CRNA, Anesthesiologist and Surgeon  Anesthesia Plan Comments:        Anesthesia Quick Evaluation

## 2022-07-12 NOTE — ED Notes (Signed)
ED TO INPATIENT HANDOFF REPORT  ED Nurse Name and Phone #: Cinda Quest, RN [phone]      S Name/Age/Gender Governor Rooks Coakley 77 y.o. male Room/Bed: WA24/WA24  Code Status   Code Status: Full Code  Home/SNF/Other Home Patient oriented to: self and place Is this baseline? No   Triage Complete: Triage complete  Chief Complaint Coffee ground emesis [K92.0]  Triage Note Pt arrives [facility] from home for emesis today. Right knee replacement 3 days ago.   Allergies Allergies  Allergen Reactions   Sulfa Antibiotics     Katherina Right syndrome     Level of Care/Admitting Diagnosis ED Disposition     ED Disposition  Admit   Condition  --   Comment  Hospital Area: [facility] [[id number]]  Level of Care: Progressive [102]  Admit to Progressive based on following criteria: MULTISYSTEM THREATS such as stable sepsis, metabolic/electrolyte imbalance with or without encephalopathy that is responding to early treatment.  May admit patient to [facility] or [facility] if equivalent level of care is available:: No  Covid Evaluation: Asymptomatic - no recent exposure (last 10 days) testing not required  Diagnosis: Coffee ground emesis [331796]  Admitting Physician: Lenore Cordia [[id number]]  Attending Physician: Lenore Cordia [[id number]]  Certification:: I certify this patient will need inpatient services for at least 2 midnights  Estimated Length of Stay: 2          B Medical/Surgery History Past Medical History:  Diagnosis Date   Arthritis    Cancer ([place])    skin melanoma   Depression    Diverticulitis    2 bouts in 10 years   Hyperlipemia    Hypertension    Hypothyroid    Scoliosis    Thyroid disease    Past Surgical History:  Procedure Laterality Date   APPENDECTOMY     CATARACT EXTRACTION W/ INTRAOCULAR LENS IMPLANT Bilateral    COLONOSCOPY     EYE SURGERY     HERNIA REPAIR     KNEE ARTHROSCOPY Right     x2   KNEE SURGERY Left     LAPAROSCOPIC APPENDECTOMY  05/28/2011   Procedure: APPENDECTOMY LAPAROSCOPIC;  Surgeon: Edward Jolly, MD;  Location: [facility];  Service: General;  Laterality: N/A;   PAROTID GLAND TUMOR EXCISION     ROOT CANAL     ROTATOR CUFF REPAIR     bil    SALIVARY GLAND SURGERY     L side   SKIN CANCER EXCISION     on his back and was benign   TOTAL HIP ARTHROPLASTY Left 04/14/2019   Procedure: TOTAL HIP ARTHROPLASTY ANTERIOR APPROACH;  Surgeon: Gaynelle Arabian, MD;  Location: [facility];  Service: Orthopedics;  Laterality: Left;  172mn   TOTAL KNEE ARTHROPLASTY Right 07/08/2022   Procedure: TOTAL KNEE ARTHROPLASTY;  Surgeon: AGaynelle Arabian MD;  Location[facility];  Service: Orthopedics;  Laterality: Right;     A IV Location/Drains/Wounds Patient Lines/Drains/Airways Status     Active Line/Drains/Airways     Name Placement date Placement time Site Days   Peripheral IV 07/11/22 20 G 1" Left;Posterior Hand 07/11/22  2207  Hand  1   Urethral Catheter Carlette Palmatier, RN Non-latex 16 Fr. 07/11/22  2238  Non-latex  1   Incision - 3 Ports Right;Upper Umbilicus Left;Lower 05/28/11  --  -- 4063            Intake/Output Last 24 hours No intake or output data in the 24  hours ending 07/12/22 0024  Labs/Imaging Results for orders placed or performed during the hospital encounter of 07/11/22 (from the past 48 hour(s))  CBC with Differential     Status: Abnormal   Collection Time: 07/11/22  9:42 PM  Result Value Ref Range   WBC 18.8 (H) 4.0 - 10.5 K/uL   RBC 3.32 (L) 4.22 - 5.81 MIL/uL   Hemoglobin 10.5 (L) 13.0 - 17.0 g/dL   HCT 30.2 (L) 39.0 - 52.0 %   MCV 91.0 80.0 - 100.0 fL   MCH 31.6 26.0 - 34.0 pg   MCHC 34.8 30.0 - 36.0 g/dL   RDW 12.8 11.5 - 15.5 %   Platelets 164 150 - 400 K/uL   nRBC 0.0 0.0 - 0.2 %   Neutrophils Relative % 85 %   Neutro Abs 16.1 (H) 1.7 - 7.7 K/uL   Lymphocytes Relative 8 %   Lymphs Abs 1.5 0.7 - 4.0 K/uL   Monocytes Relative 6 %   Monocytes Absolute 1.1 (H) 0.1 -  1.0 K/uL   Eosinophils Relative 0 %   Eosinophils Absolute 0.0 0.0 - 0.5 K/uL   Basophils Relative 0 %   Basophils Absolute 0.0 0.0 - 0.1 K/uL   Immature Granulocytes 1 %   Abs Immature Granulocytes 0.10 (H) 0.00 - 0.07 K/uL    Comment: Performed at [facility], [place] [address] [place], [place] [address]  Comprehensive metabolic panel     Status: Abnormal   Collection Time: 07/11/22  9:42 PM  Result Value Ref Range   Sodium 124 (L) 135 - 145 mmol/L    Comment: DELTA CHECK NOTED   Potassium 3.5 3.5 - 5.1 mmol/L   Chloride 92 (L) 98 - 111 mmol/L   CO2 20 (L) 22 - 32 mmol/L   Glucose, Bld 125 (H) 70 - 99 mg/dL    Comment: Glucose reference range applies only to samples taken after fasting for at least 8 hours.   BUN 80 (H) 8 - 23 mg/dL   Creatinine, Ser 3.03 (H) 0.61 - 1.24 mg/dL    Comment: DELTA CHECK NOTED   Calcium 8.0 (L) 8.9 - 10.3 mg/dL   Total Protein 6.0 (L) 6.5 - 8.1 g/dL   Albumin 2.8 (L) 3.5 - 5.0 g/dL   AST 28 15 - 41 U/L   ALT 13 0 - 44 U/L   Alkaline Phosphatase 44 38 - 126 U/L   Total Bilirubin 0.9 0.3 - 1.2 mg/dL   GFR, Estimated 20 (L) >60 mL/min    Comment: (NOTE) Calculated using the CKD-EPI Creatinine Equation (2021)    Anion gap 12 5 - 15    Comment: Performed at [facility], [place] [address] [place], [place] [address]  Type and screen     Status: None   Collection Time: 07/11/22 10:15 PM  Result Value Ref Range   ABO/RH(D) O NEG    Antibody Screen NEG    Sample Expiration      07/14/2022,2359 Performed at [facility], [place] [address], [place], [place] [address]   Urinalysis, Routine w reflex microscopic -Urine, Clean Catch     Status: Abnormal   Collection Time: 07/11/22 10:49 PM  Result Value Ref Range   Color, Urine YELLOW YELLOW   APPearance CLEAR CLEAR   Specific Gravity, Urine 1.012 1.005 - 1.030   pH 6.0 5.0 - 8.0   Glucose, UA NEGATIVE NEGATIVE mg/dL   Hgb urine dipstick LARGE (A)  NEGATIVE  Bilirubin Urine NEGATIVE NEGATIVE   Ketones, ur NEGATIVE NEGATIVE mg/dL   Protein, ur 30 (A) NEGATIVE mg/dL   Nitrite NEGATIVE NEGATIVE   Leukocytes,Ua NEGATIVE NEGATIVE   RBC / HPF 21-50 0 - 5 RBC/hpf   WBC, UA 0-5 0 - 5 WBC/hpf   Bacteria, UA RARE (A) NONE SEEN   Squamous Epithelial / HPF 0-5 0 - 5 /HPF   Amorphous Crystal PRESENT     Comment: Performed at [facility], [place] [address] [place], [place] [address]   Abd 1 View (KUB)  Result Date: 07/12/2022 CLINICAL DATA:  Vomiting for 48 hours EXAM: ABDOMEN - 1 VIEW COMPARISON:  CT abdomen and pelvis 05/28/2011 FINDINGS: The bowel gas pattern is normal. Moderate stool load greatest in the right colon. No radio-opaque calculi or other significant radiographic abnormality are seen. Left THA. Degenerative changes pubic symphysis, right hip, SI joints, lumbar spine. IMPRESSION: Nonobstructive bowel-gas pattern. Moderate colonic stool load. Correlate for constipation. Electronically Signed   By: Placido Sou M.D.   On: 07/12/2022 00:19    Pending Labs Unresulted Labs (From admission, onward)     Start     Ordered   07/12/22 0500  CBC  Tomorrow morning,   R        07/11/22 2356   07/12/22 3976  Basic metabolic panel  Tomorrow morning,   R        07/11/22 2356   07/11/22 2356  Osmolality  Add-on,   AD        07/11/22 2356   07/11/22 2355  Sodium, urine, random  Add-on,   AD        07/11/22 2356   07/11/22 2355  Osmolality, urine  Add-on,   AD        07/11/22 2356            Vitals/Pain Today's Vitals   07/11/22 2230 07/11/22 2300 07/11/22 2330 07/12/22 0000  BP: (!) 143/63 (!) 132/56 (!) 127/50 (!) 131/51  Pulse: 77 83 86 89  Resp: '17 13 14 '$ (!) 24  Temp:      TempSrc:      SpO2: 95% 92% (!) 89% 93%  PainSc:        Isolation Precautions No active isolations  Medications Medications  pantoprazole (PROTONIX) injection 40 mg (has no administration in time range)  ondansetron (ZOFRAN)  injection 4 mg (has no administration in time range)  sodium chloride flush (NS) 0.9 % injection 3 mL (has no administration in time range)  acetaminophen (TYLENOL) tablet 650 mg (has no administration in time range)    Or  acetaminophen (TYLENOL) suppository 650 mg (has no administration in time range)  0.9 %  sodium chloride infusion (has no administration in time range)  senna-docusate (Senokot-S) tablet 1 tablet (has no administration in time range)  bisacodyl (DULCOLAX) EC tablet 5 mg (has no administration in time range)  melatonin tablet 10 mg (has no administration in time range)  sodium chloride 0.9 % bolus 1,000 mL (1,000 mLs Intravenous Bolus 07/11/22 2210)  pantoprazole (PROTONIX) injection 40 mg (40 mg Intravenous Given 07/11/22 2211)  ondansetron (ZOFRAN) injection 4 mg (4 mg Intravenous Given 07/11/22 2211)    Mobility Prior to surgery 3 days ago, pt was ambulatory and playing golf. Very active.      Focused Assessments   R Recommendations: See Admitting Provider Note  Report given to:   Additional Notes:

## 2022-07-12 NOTE — Progress Notes (Signed)
  Transition of Care (TOC) Screening Note   Patient Details  Name: Jake Collins Date of Birth: 1946-05-23   Transition of Care [facility]) CM/SW Contact:    Dessa Phi, RN Phone Number: 07/12/2022, 12:15 PM    Transition of Care Department [facility]) has reviewed patient and no TOC needs have been identified at this time. We will continue to monitor patient advancement through interdisciplinary progression rounds. If new patient transition needs arise, please place a TOC consult.

## 2022-07-12 NOTE — Transfer of Care (Signed)
Immediate Anesthesia Transfer of Care Note  Patient: Jake Collins  Procedure(s) Performed: ESOPHAGOGASTRODUODENOSCOPY (EGD)  Patient Location: Endoscopy Unit  Anesthesia Type:MAC  Level of Consciousness: drowsy  Airway & Oxygen Therapy: Patient Spontanous Breathing and Patient connected to face mask  Post-op Assessment: Report given to RN and Post -op Vital signs reviewed and stable  Post vital signs: Reviewed and stable  Last Vitals:  Vitals Value Taken Time  BP 101/42 07/12/22 1430  Temp    Pulse 104 07/12/22 1430  Resp 19 07/12/22 1430  SpO2 86 % 07/12/22 1430  Vitals shown include unvalidated device data.  Last Pain:  Vitals:   07/12/22 1326  TempSrc: Temporal  PainSc: 3          Complications: No notable events documented.

## 2022-07-12 NOTE — Consult Note (Signed)
Referring Provider: [facility] Primary Care Physician:  Alroy Dust, L.Marlou Sa, MD Primary Gastroenterologist:  former patient of Dr. Penelope Coop  Reason for Consultation:  Coffee ground emesis  HPI: Jake Collins is a 77 y.o. male medical history significant for HTN, HLD, hypothyroidism, GAD, depression, OA s/p right TKA 07/08/2022 who presented to the ED for evaluation of coffee-ground emesis.     Barium 09/14/2019: mild tortuous colon, diverticula, no strictures  Colon with Dr. Penelope Coop 09/2019: diverticulosis, fair, no specimens 05/2014: benign adenomatous  Patient states he had knee replacement last week and had coffee ground emesis on Monday. Two episodes. Denies pain. Denies melena/hematochezia  Per chart review questionable encephalopathy though patient appears oriented today.  Past Medical History:  Diagnosis Date   Arthritis    Cancer ([place])    skin melanoma   Depression    Diverticulitis    2 bouts in 10 years   Hyperlipemia    Hypertension    Hypothyroid    Scoliosis    Thyroid disease     Past Surgical History:  Procedure Laterality Date   APPENDECTOMY     CATARACT EXTRACTION W/ INTRAOCULAR LENS IMPLANT Bilateral    COLONOSCOPY     EYE SURGERY     HERNIA REPAIR     KNEE ARTHROSCOPY Right     x2   KNEE SURGERY Left    LAPAROSCOPIC APPENDECTOMY  05/28/2011   Procedure: APPENDECTOMY LAPAROSCOPIC;  Surgeon: Edward Jolly, MD;  [facility];  Service: General;  Laterality: N/A;   PAROTID GLAND TUMOR EXCISION     ROOT CANAL     ROTATOR CUFF REPAIR     bil    SALIVARY GLAND SURGERY     L side   SKIN CANCER EXCISION     on his back and was benign   TOTAL HIP ARTHROPLASTY Left 04/14/2019   Procedure: TOTAL HIP ARTHROPLASTY ANTERIOR APPROACH;  Surgeon: Gaynelle Arabian, MD;  Location[facility];  Service: Orthopedics;  Laterality: Left;  179mn   TOTAL KNEE ARTHROPLASTY Right 07/08/2022   Procedure: TOTAL KNEE ARTHROPLASTY;  Surgeon: AGaynelle Arabian MD;  Location[facility];   Service: Orthopedics;  Laterality: Right;    Prior to Admission medications   Medication Sig Start Date End Date Taking? Authorizing Provider  acetaminophen (TYLENOL) 500 MG tablet Take 2 tablets (1,000 mg total) by mouth every 8 (eight) hours as needed. Patient taking differently: Take 1,000 mg by mouth every 8 (eight) hours as needed for moderate pain. 07/09/22  Yes Eveland, SDrema Balzarine PA-C  aspirin EC 325 MG tablet Take 1 tablet (325 mg total) by mouth 2 (two) times daily for 21 days. 07/09/22 07/30/22 Yes Eveland, SDrema Balzarine PA-C  cholecalciferol (VITAMIN D3) 25 MCG (1000 UT) tablet Take 1,000 Units by mouth daily.   Yes [provider]  Cyanocobalamin (B-12) 5000 MCG CAPS Take 5,000 mcg by mouth daily.   Yes [provider]  EPINEPHrine 0.3 mg/0.3 mL IJ SOAJ injection Inject 0.3 mg into the muscle as needed for anaphylaxis. 01/13/19  Yes [provider]  famotidine (PEPCID) 20 MG tablet Take 20 mg by mouth daily.   Yes [provider]  glucosamine-chondroitin 500-400 MG tablet Take 1 tablet by mouth daily.   Yes [provider]  hydrochlorothiazide (MICROZIDE) 12.5 MG capsule Take 12.5 mg by mouth daily.   Yes [provider]  latanoprost (XALATAN) 0.005 % ophthalmic solution Place 1 drop into both eyes at bedtime.   Yes [provider]  levothyroxine (  SYNTHROID, LEVOTHROID) 100 MCG tablet Take 100 mcg by mouth daily.     Yes [provider]  lithium carbonate 150 MG capsule Take 1 capsule (150 mg total) by mouth at bedtime. 06/20/22  Yes Cottle, Billey Co., MD  loratadine (CLARITIN) 10 MG tablet Take 10 mg by mouth daily as needed for allergies.   Yes [provider]  Magnesium 250 MG TABS Take 250 mg by mouth daily.   Yes [provider]  Melatonin 5 MG CHEW Chew 10 mg by mouth at bedtime. Gummy tabs   Yes [provider]  mupirocin ointment (BACTROBAN) 2 % Apply 1 Application  topically daily. 06/25/22  Yes [provider]  nebivolol (BYSTOLIC) 5 MG tablet Take 5 mg by mouth daily.   Yes [provider]  Omega 3 1200 MG CAPS Take 1,200 mg by mouth daily.   Yes [provider]  OVER THE COUNTER MEDICATION Take 2 tablets by mouth daily. Neuriva Plus   Yes [provider]  pyridOXINE (VITAMIN B-6) 100 MG tablet Take 100 mg by mouth daily.   Yes [provider]  REPATHA SURECLICK 527 MG/ML SOAJ Inject 140 mg into the skin every 14 (fourteen) days. 01/24/20  Yes [provider]  Vilazodone HCl 20 MG TABS Take 1 tablet (20 mg total) by mouth daily at 12 noon. Patient taking differently: Take 20 mg by mouth daily at 12 noon. Pt takes at 8am 06/20/22  Yes Cottle, Billey Co., MD  methocarbamol (ROBAXIN) 500 MG tablet Take 1 tablet (500 mg total) by mouth every 6 (six) hours as needed for muscle spasms. Patient not taking: Reported on 07/11/2022 07/09/22   Holley Dexter, PA-C  oxyCODONE (OXY IR/ROXICODONE) 5 MG immediate release tablet Take 1-2 tablets (5-10 mg total) by mouth every 6 (six) hours as needed for severe pain. Patient not taking: Reported on 07/11/2022 07/09/22   Holley Dexter, PA-C    Scheduled Meds:  levothyroxine  100 mcg Oral Q0600   melatonin  10 mg Oral QHS   pantoprazole (PROTONIX) IV  40 mg Intravenous Q12H   sodium chloride flush  3 mL Intravenous Q12H   Continuous Infusions:  sodium chloride 75 mL/hr at 07/12/22 0112   PRN Meds:.acetaminophen **OR** acetaminophen, bisacodyl, ondansetron (ZOFRAN) IV, senna-docusate  Allergies as of 07/11/2022 - Review Complete 07/11/2022  Allergen Reaction Noted   Sulfa antibiotics  05/28/2011    Family History  Problem Relation Age of Onset   Heart disease Father     Social History   Socioeconomic History   Marital status: Married    Spouse name: Not on file   Number of children: Not on file   Years of education: Not on file   Highest  education level: Not on file  Occupational History   Not on file  Tobacco Use   Smoking status: Never   Smokeless tobacco: Never  Vaping Use   Vaping Use: Never used  Substance and Sexual Activity   Alcohol use: No   Drug use: No   Sexual activity: Not Currently  Other Topics Concern   Not on file  Social History Narrative   Not on file   Social Determinants of Health   Financial Resource Strain: Not on file  Food Insecurity: No Food Insecurity (07/08/2022)   Hunger Vital Sign    Worried About Running Out of Food in the Last Year: Never true    Ran Out of Food in the Last Year:  Never true  Transportation Needs: No Transportation Needs (07/08/2022)   PRAPARE - Hydrologist (Medical): No    Lack of Transportation (Non-Medical): No  Physical Activity: Not on file  Stress: Not on file  Social Connections: Not on file  Intimate Partner Violence: Not At Risk (07/08/2022)   Humiliation, Afraid, Rape, and Kick questionnaire    Fear of Current or Ex-Partner: No    Emotionally Abused: No    Physically Abused: No    Sexually Abused: No    Review of Systems: All negative except as stated above in HPI.  Physical Exam:Physical Exam Constitutional:      Appearance: Normal appearance.  HENT:     Nose: Nose normal. No congestion.     Mouth/Throat:     Pharynx: Oropharynx is clear.  Eyes:     Extraocular Movements: Extraocular movements intact.     Conjunctiva/sclera: Conjunctivae normal.  Cardiovascular:     Rate and Rhythm: Normal rate and regular rhythm.  Pulmonary:     Effort: Pulmonary effort is normal.     Breath sounds: Normal breath sounds.  Abdominal:     General: Abdomen is flat. Bowel sounds are normal.     Palpations: Abdomen is soft.  Musculoskeletal:     Cervical back: Normal range of motion and neck supple.  Skin:    General: Skin is warm.     Coloration: Skin is not jaundiced.  Neurological:     General: No focal deficit present.      Mental Status: He is alert and oriented to person, place, and time.  Psychiatric:        Mood and Affect: Mood normal.        Behavior: Behavior normal.        Thought Content: Thought content normal.        Judgment: Judgment normal.     Vital signs: Vitals:   07/12/22 0059 07/12/22 0619  BP: (!) 109/52 (!) 136/52  Pulse: 87 97  Resp: 20 17  Temp: 97.7 F (36.5 C) 98 F (36.7 C)  SpO2: 94% 98%   Last BM Date : 07/11/22    GI:  Lab Results: Recent Labs    07/11/22 2142 07/12/22 0422  WBC 18.8* 14.3*  HGB 10.5* 8.9*  HCT 30.2* 26.7*  PLT 164 143*   BMET Recent Labs    07/11/22 2142 07/12/22 0422  NA 124* 134*  K 3.5 3.2*  CL 92* 105  CO2 20* 20*  GLUCOSE 125* 103*  BUN 80* 62*  CREATININE 3.03* 2.02*  CALCIUM 8.0* 8.2*   LFT Recent Labs    07/11/22 2142  PROT 6.0*  ALBUMIN 2.8*  AST 28  ALT 13  ALKPHOS 44  BILITOT 0.9   PT/INR No results for input(s): "LABPROT", "INR" in the last 72 hours.   Studies/Results: US RENAL  Result Date: 07/12/2022 CLINICAL DATA:  Vomiting, constipation EXAM: RENAL / URINARY TRACT ULTRASOUND COMPLETE COMPARISON:  CT abdomen/pelvis dated 05/28/2011 FINDINGS: Right Kidney: Renal measurements: 10.0 x 4.9 x 4.9 cm = volume: 125 mL. Echogenicity within normal limits. Mild right hydronephrosis. Left Kidney: Renal measurements: 11.2 x 6.2 x 5.2 cm = volume: 188 mL. Echogenicity within normal limits. Fullness of the left renal collecting system without frank hydronephrosis. Bladder: Decompressed by an indwelling Foley catheter Other: None. IMPRESSION: Mild right hydronephrosis. Fullness of the left renal collecting system without frank hydronephrosis. While nonspecific, these findings raise the possibility of chronic bladder outlet obstruction.  Bladder decompressed by an indwelling Foley catheter. Electronically Signed   By: Julian Hy M.D.   On: 07/12/2022 00:29   Abd 1 View (KUB)  Result Date: 07/12/2022 CLINICAL  DATA:  Vomiting for 48 hours EXAM: ABDOMEN - 1 VIEW COMPARISON:  CT abdomen and pelvis 05/28/2011 FINDINGS: The bowel gas pattern is normal. Moderate stool load greatest in the right colon. No radio-opaque calculi or other significant radiographic abnormality are seen. Left THA. Degenerative changes pubic symphysis, right hip, SI joints, lumbar spine. IMPRESSION: Nonobstructive bowel-gas pattern. Moderate colonic stool load. Correlate for constipation. Electronically Signed   By: Placido Sou M.D.   On: 07/12/2022 00:19    Impression: Anemia - hgb 8.9 (10.5) - BUN 62, Cr. 2.02   Plan:  Plan for EGD today. I thoroughly discussed the procedures to include nature, alternatives, benefits, and risks including but not limited to bleeding, perforation, infection, anesthesia/cardiac and pulmonary complications. Patient provides understanding and gave verbal consent to proceed. Continue Protonix IV '40mg'$  BID NPO Continue anti-emetics and supportive care as needed. [facility] will follow.      LOS: 1 day   Garnette Scheuermann  PA-C 07/12/2022, 8:28 AM  Contact #  [phone]

## 2022-07-12 NOTE — Progress Notes (Signed)
Jake Collins 1:52 PM  Subjective: Patient seen and examined in his hospital computer chart reviewed and his case discussed with our PA and his wife as well please see her note for details but he has been on some increased dose of aspirin since his surgery and had coffee-ground emesis both on Monday and yesterday without a bowel movement and is on a Pepcid every day although has not had an upper tract workup although did have a colonoscopy by 1 my partners not long ago and currently he is doing fine has no new complaints  Objective: Vital signs stable afebrile no acute distress exam please see preassessment evaluation BUN and creatinine significantly elevated over baseline hemoglobin decreased over baseline  Assessment: Coffee-ground emesis on a patient with recent knee surgery and on aspirin and occasional nonsteroidals at home  Plan: The risk benefits methods of endoscopy was discussed with the patient and his wife and we will proceed today with anesthesia assistance with further workup and plans pending those findings and as an aside he does have blood in his urine which may just be from a Foley at the time of surgery if he had that but the wife does complain he has had that since he has been home  [facility] E  office [phone] After 5PM or if no answer call [phone]

## 2022-07-13 DIAGNOSIS — K922 Gastrointestinal hemorrhage, unspecified: Secondary | ICD-10-CM | POA: Diagnosis not present

## 2022-07-13 DIAGNOSIS — N179 Acute kidney failure, unspecified: Secondary | ICD-10-CM | POA: Diagnosis not present

## 2022-07-13 DIAGNOSIS — G9341 Metabolic encephalopathy: Secondary | ICD-10-CM | POA: Diagnosis not present

## 2022-07-13 LAB — CBC WITH DIFFERENTIAL/PLATELET
Abs Immature Granulocytes: 0.04 10*3/uL (ref 0.00–0.07)
Basophils Absolute: 0 10*3/uL (ref 0.0–0.1)
Basophils Relative: 0 %
Eosinophils Absolute: 0.2 10*3/uL (ref 0.0–0.5)
Eosinophils Relative: 3 %
HCT: 26.2 % — ABNORMAL LOW (ref 39.0–52.0)
Hemoglobin: 8.5 g/dL — ABNORMAL LOW (ref 13.0–17.0)
Immature Granulocytes: 0 %
Lymphocytes Relative: 14 %
Lymphs Abs: 1.2 10*3/uL (ref 0.7–4.0)
MCH: 31.6 pg (ref 26.0–34.0)
MCHC: 32.4 g/dL (ref 30.0–36.0)
MCV: 97.4 fL (ref 80.0–100.0)
Monocytes Absolute: 1 10*3/uL (ref 0.1–1.0)
Monocytes Relative: 11 %
Neutro Abs: 6.4 10*3/uL (ref 1.7–7.7)
Neutrophils Relative %: 72 %
Platelets: 171 10*3/uL (ref 150–400)
RBC: 2.69 MIL/uL — ABNORMAL LOW (ref 4.22–5.81)
RDW: 13.4 % (ref 11.5–15.5)
WBC: 8.9 10*3/uL (ref 4.0–10.5)
nRBC: 0 % (ref 0.0–0.2)

## 2022-07-13 LAB — BASIC METABOLIC PANEL
Anion gap: 5 (ref 5–15)
BUN: 28 mg/dL — ABNORMAL HIGH (ref 8–23)
CO2: 24 mmol/L (ref 22–32)
Calcium: 8.1 mg/dL — ABNORMAL LOW (ref 8.9–10.3)
Chloride: 110 mmol/L (ref 98–111)
Creatinine, Ser: 1.23 mg/dL (ref 0.61–1.24)
GFR, Estimated: >60 mL/min (ref >60)
Glucose, Bld: 102 mg/dL — ABNORMAL HIGH (ref 70–99)
Potassium: 3.1 mmol/L — ABNORMAL LOW (ref 3.5–5.1)
Sodium: 139 mmol/L (ref 135–145)

## 2022-07-13 LAB — LITHIUM LEVEL: Lithium Lvl: <0.06 mmol/L — ABNORMAL LOW (ref 0.60–1.20)

## 2022-07-13 MED ORDER — PANTOPRAZOLE SODIUM 40 MG PO TBEC
40.0000 mg | DELAYED_RELEASE_TABLET | Freq: Two times a day (BID) | ORAL | Status: DC
  Administered 2022-07-13 – 2022-07-15 (×4): 40 mg via ORAL
  Filled 2022-07-13 (×4): qty 1

## 2022-07-13 MED ORDER — PANTOPRAZOLE SODIUM 40 MG PO TBEC: 40.0000 mg | DELAYED_RELEASE_TABLET | Freq: Two times a day (BID) | ORAL | Status: DC

## 2022-07-13 MED ORDER — POTASSIUM CHLORIDE CRYS ER 20 MEQ PO TBCR
40.0000 meq | EXTENDED_RELEASE_TABLET | Freq: Once | ORAL | Status: AC
  Administered 2022-07-13: 40 meq via ORAL
  Filled 2022-07-13: qty 2

## 2022-07-13 MED ORDER — HALOPERIDOL LACTATE 5 MG/ML IJ SOLN
2.0000 mg | Freq: Once | INTRAMUSCULAR | Status: AC
  Administered 2022-07-13: 2 mg via INTRAVENOUS
  Filled 2022-07-13: qty 1

## 2022-07-13 NOTE — Evaluation (Signed)
Physical Therapy Evaluation Patient Details Name: Jake Collins MRN: [id number] DOB: March 19, 1946 Today's Date: 07/13/2022  History of Present Illness  77 year old man status post right total knee arthroplasty on 1/29, DC on 1/30,  who presented with coffee-ground emesis 07/11/22.  Admitted for same, AKI, acute hyponatremia and encephalopathy.   PMH: HLD, HTN,scoliosis, LTHA  Clinical Impression  Pt admitted with above diagnosis.  Pt currently with functional limitations due to the deficits listed below (see PT Problem List). Pt will benefit from skilled PT to increase their independence and safety with mobility to allow discharge to the venue listed below.   The patient is  resting in bed, flat affect, not oriented to place ,time and  reason to be in hospital. Wife not  present.  Patient did ambulate x 69' with Rw, min assist for safety. Continue PT for mobility, safety and post TKA exercises.        Recommendations for follow up therapy are one component of a multi-disciplinary discharge planning process, led by the attending physician.  Recommendations may be updated based on patient status, additional functional criteria and insurance authorization.  Follow Up Recommendations Outpatient PT (per original plan at DC)      Assistance Recommended at Discharge Frequent or constant Supervision/Assistance  Patient can return home with the following  A little help with walking and/or transfers;A little help with bathing/dressing/bathroom;Assistance with cooking/housework;Assist for transportation;Help with stairs or ramp for entrance    Equipment Recommendations None recommended by PT  Recommendations for Other Services       Functional Status Assessment Patient has had a recent decline in their functional status and demonstrates the ability to make significant improvements in function in a reasonable and predictable amount of time.     Precautions / Restrictions Precautions Precautions:  Fall;Knee Restrictions Weight Bearing Restrictions: Yes RLE Weight Bearing: Weight bearing as tolerated      Mobility  Bed Mobility   Bed Mobility: Supine to Sit     Supine to sit: Min guard     General bed mobility comments: extra time, HOB raised, used  rail,    Transfers Overall transfer level: Needs assistance Equipment used: Rolling walker (2 wheels) Transfers: Sit to/from Stand Sit to Stand: Min assist           General transfer comment: steady assist to rise from  bed, decreased control of descent, cues for hand and right leg position    Ambulation/Gait Ambulation/Gait assistance: Min assist Gait Distance (Feet): 80 Feet Assistive device: Rolling walker (2 wheels) Gait Pattern/deviations: Step-to pattern, Step-through pattern Gait velocity: decreased     General Gait Details: decreased step length right leg  Stairs            Wheelchair Mobility    Modified Rankin (Stroke Patients Only)       Balance Overall balance assessment: Needs assistance, History of Falls Sitting-balance support: Feet supported, No upper extremity supported Sitting balance-Leahy Scale: Good     Standing balance support: Reliant on assistive device for balance, During functional activity, Bilateral upper extremity supported Standing balance-Leahy Scale: Poor                               Pertinent Vitals/Pain Pain Assessment Pain Assessment: Faces Faces Pain Scale: Hurts little more Pain Location: right knee Pain Descriptors / Indicators: Operative site guarding, Tender Pain Intervention(s): Monitored during session, Ice applied    Home Living Family/patient expects to  be discharged to:: Private residence Living Arrangements: Spouse/significant other Available Help at Discharge: Family;Available 24 hours/day Type of Home: House Home Access: Stairs to enter Entrance Stairs-Rails: None Entrance Stairs-Number of Steps: 1   Home Layout: One  level Home Equipment: Shower seat - built Medical sales representative (2 wheels)      Prior Function               Mobility Comments: using RW since TKA       Hand Dominance        Extremity/Trunk Assessment   Upper Extremity Assessment Upper Extremity Assessment: Overall WFL for tasks assessed    Lower Extremity Assessment Lower Extremity Assessment: RLE deficits/detail RLE Deficits / Details: grossly 4= in quads, -10 extension, knee flex 70       Communication   Communication: No difficulties  Cognition Arousal/Alertness: Awake/alert Behavior During Therapy: Flat affect Overall Cognitive Status: Impaired/Different from baseline Area of Impairment: Orientation, Attention                 Orientation Level: Place, Time, Situation Current Attention Level: Selective           General Comments: not oriented to place and time and reason in hospital. Repeated "[place]", each time asked  where he is and did not recal after 5 minutes        General Comments      Exercises Total Joint Exercises Ankle Circles/Pumps: AROM, Both, 10 reps Quad Sets: Right, 10 reps, AAROM Short Arc Quad: AAROM, Right, 10 reps Heel Slides: AAROM, Right, 10 reps Hip ABduction/ADduction: AAROM, Right, 10 reps Straight Leg Raises: AAROM, Right, 10 reps   Assessment/Plan    PT Assessment Patient needs continued PT services  PT Problem List Decreased strength;Decreased range of motion;Decreased activity tolerance;Decreased balance;Decreased mobility;Pain;Decreased cognition       PT Treatment Interventions DME instruction;Gait training;Stair training;Functional mobility training;Therapeutic activities;Therapeutic exercise;Balance training;Neuromuscular re-education;Patient/family education    PT Goals (Current goals can be found in the Care Plan section)  Acute Rehab PT Goals Patient Stated Goal: none stated PT Goal Formulation: Patient unable to participate in goal setting Time  For Goal Achievement: 07/20/22 Potential to Achieve Goals: Good    Frequency Min 6X/week     Co-evaluation               AM-PAC PT "6 Clicks" Mobility  Outcome Measure Help needed turning from your back to your side while in a flat bed without using bedrails?: A Little Help needed moving from lying on your back to sitting on the side of a flat bed without using bedrails?: A Little Help needed moving to and from a bed to a chair (including a wheelchair)?: A Little Help needed standing up from a chair using your arms (e.g., wheelchair or bedside chair)?: A Little Help needed to walk in hospital room?: A Little Help needed climbing 3-5 steps with a railing? : A Lot 6 Click Score: 17    End of Session Equipment Utilized During Treatment: Gait belt Activity Tolerance: Patient tolerated treatment well Patient left: in chair;with call bell/phone within reach;with chair alarm set Nurse Communication: Mobility status PT Visit Diagnosis: Unsteadiness on feet (R26.81);Difficulty in walking, not elsewhere classified (R26.2) Pain - Right/Left: Right Pain - part of body: Knee    Time: 1009-1047 PT Time Calculation (min) (ACUTE ONLY): 38 min   Charges:   PT Evaluation $PT Eval Low Complexity: 1 Low PT Treatments $Gait Training: 8-22 mins $Therapeutic Exercise: 8-22 mins  [place] Office [phone] Weekend [phone]   Claretha Cooper 07/13/2022, 11:13 AM

## 2022-07-13 NOTE — Progress Notes (Signed)
Progress Note   Patient: Jake Collins:[id number] DOB: Mar 30, 1946 DOA: 07/11/2022     2 DOS: the patient was seen and examined on 07/13/2022   Brief hospital course: 77 year old man status post right total knee arthroplasty on 1/29 who presented with coffee-ground emesis.  Admitted for same, AKI, acute hyponatremia and encephalopathy.  Assessment and Plan: UGIB with coffee ground emesis with ABLA Episodes prior to admission.  Has been on ASA 325 mg BID for only a couple days after recent right TKA. Hemoglobin 13.8 >> 10.5 over 48-hour period.  Aspirin on hold.  Continue Protonix.  Status post EGD showing moderately severe reflux esophagitis with no bleeding. Hemoglobin stable. PPI twice daily for 1 month and then once a day thereafter.  Can resume aspirin in 2 days.  Return to GI clinic in 1 month.   Acute kidney injury: BUN 80 and creatinine 3.03 compared to BUN 19/creatinine 1.23 two days prior to admission.   Suspect prerenal and postrenal based on history of poor oral intake and ultrasound findings suggesting chronic bladder outlet obstruction.. Creatinine back to normal.  Stop IV fluids.   Hyponatremia: Acute hyponatremia with sodium down to 124 compared to 138 on 1/30.  Multifactorial but probably mostly from poor oral intake and diuretic.   Resolved now with IV fluids.   Acute metabolic encephalopathy: Favor delirium. Was present on discharge per report.  Probably multifactorial related to uremia, hyponatremia, medication effect.   Somewhat better today. Wife concerned about intermittent hallucinations.  Patient has long-term outpatient psychiatrist.  She requested inpatient psychiatry consultation.   Leukocytosis: WBC 18 on admission, resolved spontaneously.  Consistent with stress demarginization from surgery   S/p right TKA 07/08/2022: Performed by Dr. Wynelle Link.  Expected postoperative changes noted at knee.  Continue to monitor for postop complication.  SCDs for VTE  prophylaxis since ASA is on hold as above.   Essential hypertension: Stable.  Holding HCTZ with AKI, holding Bystolic with low normal BP.   Hypothyroidism: Continue Synthroid.   GAD/depression: Has been on lithium chronically, will hold given AKI and hyponatremia.   Can resume lithium and vilazodone tomorrow.  Lithium level was undetectable.   Constipation seen on AXR Bowel regimen      Subjective:  Feels ok, no complaints, no bleeding  Physical Exam: Vitals:   07/13/22 0452 07/13/22 0455 07/13/22 0900 07/13/22 1447  BP: (!) 139/55  (!) 130/58 113/60  Pulse: 87  88 78  Resp: '18  15 18  '$ Temp: 97.8 F (36.6 C)  98.7 F (37.1 C) 97.6 F (36.4 C)  TempSrc: Oral  Oral   SpO2: 95%  95% 97%  Weight:  82.4 kg     Physical Exam Vitals reviewed.  Constitutional:      General: He is not in acute distress.    Appearance: He is not ill-appearing or toxic-appearing.  Cardiovascular:     Rate and Rhythm: Normal rate and regular rhythm.     Heart sounds: No murmur heard. Pulmonary:     Effort: Pulmonary effort is normal. No respiratory distress.     Breath sounds: No wheezing, rhonchi or rales.  Neurological:     Mental Status: He is alert.  Psychiatric:        Mood and Affect: Mood normal.        Behavior: Behavior normal.     Comments: Mildly confused, Oriented to self and month, not year     Data Reviewed: K+ 3.1 Creatinine down to 1.23 Hgb stable 8.5  Lithium level low  Family Communication: wife by telephone  Disposition: Status is: Inpatient Remains inpatient appropriate because: GIB  Planned Discharge Destination: Home with Home Health    Time spent: 35 minutes  Author: Murray Hodgkins, MD 07/13/2022 5:46 PM  For on call review [email or web address].

## 2022-07-13 NOTE — Progress Notes (Addendum)
Physical Therapy Treatment Patient Details Name: Jake Collins MRN: [id number] DOB: 05/03/1946 Today's Date: 07/13/2022   History of Present Illness 77 year old man status post right total knee arthroplasty on 1/29, DC on 1/30,  who presented with coffee-ground emesis 07/11/22.  Admitted for same, AKI, acute hyponatremia and encephalopathy.   PMH: HLD, HTN,scoliosis, LTHA    PT Comments    Patient's wife and  daughter present. Wife expresses concern about  DC, states that Patient has to be able to stand up from a  chair.  Patient is more alert this visit. Patient   min guard(no support to stand) from recliner.  Patient ambulated with RW and  min guard x 120'.  TOC notified that wife  would like information to hire extra help at [place].   Recommendations for follow up therapy are one component of a multi-disciplinary discharge planning process, led by the attending physician.  Recommendations may be updated based on patient status, additional functional criteria and insurance authorization.  Follow Up Recommendations  Outpatient PT     Assistance Recommended at Discharge Frequent or constant Supervision/Assistance  Patient can return home with the following A little help with walking and/or transfers;A little help with bathing/dressing/bathroom;Assistance with cooking/housework;Assist for transportation;Help with stairs or ramp for entrance   Equipment Recommendations  None recommended by PT    Recommendations for Other Services       Precautions / Restrictions Precautions Precautions: Fall;Knee Precaution Comments: Hx of fall (recent),     Mobility  Bed Mobility   Bed Mobility: Sit to Supine     Supine to sit: Min guard Sit to supine: Min assist   General bed mobility comments: assist with right leg    Transfers Overall transfer level: Needs assistance Equipment used: Rolling walker (2 wheels) Transfers: Sit to/from Stand Sit to Stand: Min guard           General  transfer comment: cues for safety, slightly impulsive,    Ambulation/Gait Ambulation/Gait assistance: Min guard Gait Distance (Feet): 120 Feet Assistive device: Rolling walker (2 wheels) Gait Pattern/deviations: Step-through pattern, Trunk flexed Gait velocity: decreased     General Gait Details: frequent cues for psture, tends  to lean forward  and slightly behind RW   Stairs             Wheelchair Mobility    Modified Rankin (Stroke Patients Only)       Balance Overall balance assessment: Needs assistance, History of Falls Sitting-balance support: Feet supported, No upper extremity supported Sitting balance-Leahy Scale: Good     Standing balance support: Reliant on assistive device for balance, During functional activity, Bilateral upper extremity supported Standing balance-Leahy Scale: Fair                              Cognition Arousal/Alertness: Awake/alert Behavior During Therapy: Flat affect Overall Cognitive Status: Impaired/Different from baseline Area of Impairment: Orientation, Attention                 Orientation Level: Place, Time, Situation Current Attention Level: Selective           General Comments: more alert, finally stated that he was in [place], Wife and dtr present        Exercises    General Comments        Pertinent Vitals/Pain Pain Assessment Pain Assessment: Faces Faces Pain Scale: Hurts a little bit Pain Location: right knee Pain Descriptors / Indicators:  Operative site guarding, Tender Pain Intervention(s): Monitored during session, Ice applied    Home Living                          Prior Function            PT Goals (current goals can now be found in the care plan section) Progress towards PT goals: Progressing toward goals    Frequency    Min 6X/week      PT Plan Current plan remains appropriate    Co-evaluation              AM-PAC PT "6 Clicks" Mobility    Outcome Measure  Help needed turning from your back to your side while in a flat bed without using bedrails?: A Little Help needed moving from lying on your back to sitting on the side of a flat bed without using bedrails?: A Little Help needed moving to and from a bed to a chair (including a wheelchair)?: A Little Help needed standing up from a chair using your arms (e.g., wheelchair or bedside chair)?: A Little Help needed to walk in hospital room?: A Little Help needed climbing 3-5 steps with a railing? : A Little 6 Click Score: 18    End of Session Equipment Utilized During Treatment: Gait belt Activity Tolerance: Patient tolerated treatment well Patient left: in bed;with family/visitor present;with bed alarm set Nurse Communication: Mobility status PT Visit Diagnosis: Unsteadiness on feet (R26.81);Difficulty in walking, not elsewhere classified (R26.2) Pain - Right/Left: Right Pain - part of body: Knee     Time: 1610-9604 PT Time Calculation (min) (ACUTE ONLY): 25 min  Charges:  $Gait Training: 8-22 mins $Self Care/Home Management: [place] Office [phone] Weekend [phone]   Claretha Cooper 07/13/2022, 3:36 PM

## 2022-07-13 NOTE — TOC Progression Note (Signed)
Transition of Care [facility]) - Progression Note    Patient Details  Name: Jake Collins MRN: [id number] Date of Birth: 06-Aug-1945  [facility] of [facility]) CM/SW Contact  Henrietta Dine, RN Phone Number: 07/13/2022, 3:04 PM  Clinical Narrative:    Notified pt's wife would like to discuss obtaining extra help at home; spoke w/ Mrs Baack and dtr in room; Mrs Krupka agrees to PT recc of pt receiving OPPT; explained can pay out of pocket for private duty care; she verbalized understanding and requested list of agencies; list of agencies provided and she will follow up w/ agency of her choice.        Expected Discharge Plan and Services                                               Social Determinants of Health (SDOH) Interventions SDOH Screenings   Food Insecurity: No Food Insecurity (07/12/2022)  Housing: Low Risk  (07/12/2022)  Transportation Needs: No Transportation Needs (07/12/2022)  Utilities: Not At Risk (07/12/2022)  Tobacco Use: Low Risk  (07/12/2022)    Readmission Risk Interventions     No data to display

## 2022-07-13 NOTE — Progress Notes (Signed)
Jake Collins 2:35 PM  Subjective: Patient seen and examined and case discussed with his daughter and wife and is frustrated about his knee and lack of activity but wants to advance his diet has not had a bowel movement has no other complaints  Objective: Vital signs stable afebrile no acute distress abdomen is soft nontender BUN and creatinine improved hemoglobin stable  Assessment: Coffee-ground emesis secondary to significant esophagitis  Plan: Please let me know if I could be of any further assistance with this hospital stay this weekend otherwise recommend twice daily pump inhibitors for 1 month then decrease to once a day continue antireflux measures make sure he gets his MiraLAX to help him move his bowels and follow-up with me in 1 month to discuss the timing of repeat endoscopy to document healing  [facility]  office [phone] After 5PM or if no answer call [phone]

## 2022-07-13 NOTE — Plan of Care (Signed)
  Problem: Pain Management: Goal: Pain level will decrease with appropriate interventions Outcome: Progressing   

## 2022-07-14 ENCOUNTER — Encounter (HOSPITAL_COMMUNITY): Payer: Self-pay | Admitting: Gastroenterology

## 2022-07-14 DIAGNOSIS — K922 Gastrointestinal hemorrhage, unspecified: Secondary | ICD-10-CM | POA: Diagnosis not present

## 2022-07-14 DIAGNOSIS — N179 Acute kidney failure, unspecified: Secondary | ICD-10-CM | POA: Diagnosis not present

## 2022-07-14 DIAGNOSIS — K920 Hematemesis: Secondary | ICD-10-CM | POA: Diagnosis not present

## 2022-07-14 DIAGNOSIS — G9341 Metabolic encephalopathy: Secondary | ICD-10-CM | POA: Diagnosis not present

## 2022-07-14 DIAGNOSIS — Z046 Encounter for general psychiatric examination, requested by authority: Secondary | ICD-10-CM

## 2022-07-14 MED ORDER — LITHIUM CARBONATE 150 MG PO CAPS
150.0000 mg | ORAL_CAPSULE | Freq: Every day | ORAL | Status: DC
  Administered 2022-07-14: 150 mg via ORAL
  Filled 2022-07-14 (×2): qty 1

## 2022-07-14 MED ORDER — SILODOSIN 4 MG PO CAPS
8.0000 mg | ORAL_CAPSULE | Freq: Every day | ORAL | Status: DC
  Administered 2022-07-14 – 2022-07-15 (×2): 8 mg via ORAL
  Filled 2022-07-14 (×2): qty 2

## 2022-07-14 MED ORDER — BISACODYL 10 MG RE SUPP
10.0000 mg | Freq: Once | RECTAL | Status: AC
  Administered 2022-07-14: 10 mg via RECTAL
  Filled 2022-07-14: qty 1

## 2022-07-14 MED ORDER — BISACODYL 10 MG RE SUPP: 10.0000 mg | Freq: Every day | RECTAL | Status: DC | PRN

## 2022-07-14 MED ORDER — VILAZODONE HCL 20 MG PO TABS
20.0000 mg | ORAL_TABLET | Freq: Every day | ORAL | Status: DC
  Administered 2022-07-14 – 2022-07-15 (×2): 20 mg via ORAL
  Filled 2022-07-14 (×2): qty 1

## 2022-07-14 NOTE — Consult Note (Signed)
Jake Collins New Face-to-Face Psychiatric Evaluation   Service Date: July 14, 2022 LOS:  LOS: 3 days    Assessment  Jake Collins is a 77 y.o. male admitted medically for 07/11/2022  9:09 PM for coffee-ground emesis, AKI, acute hyponatremia and encephalopathy. He carries the psychiatric diagnoses of GAD and depression and has a past medical history of hypertension, hypothyroidism.Collins was consulted for wife requested inpatient psychiatric consultation, patient has long-term outpatient psychiatrist, wife concerned about intermittent hallucinations, consult was requested by by primary treating provider.    His current presentation of no abnormalities noted but reported confusion at time of admission is most consistent with delirium.  Current outpatient psychotropic medications include lithium and Viibryd, lithium was held after admission for elevated BUN and creatinine, was restarted today by primary treating provider, and historically he has had a positive response to these medications. He was compliant with medications prior to admission as evidenced by patient's report. On initial examination, patient denies any depression or anxiety symptoms or mood instability, denies AVH or psychosis, admits to episodes of confusion consistent with delirium. Please see plan below for detailed recommendations.   Diagnoses:  Active Hospital problems: Principal Problem:   Coffee ground emesis Active Problems:   Hypothyroid   Depression   GAD (generalized anxiety disorder)   History of total knee arthroplasty, right   AKI (acute kidney injury) ([place])   Normocytic anemia   Leukocytosis   Acute metabolic encephalopathy   Essential hypertension     Plan  ## Safety and Observation Level:  - Based on my clinical evaluation, I estimate the patient to be at minimal acute risk of self harm in the current setting - At this time, we recommend no change to current level of observation.  This decision is based on my review of the chart including patient's history and current presentation, interview of the patient, mental status examination, and consideration of suicide risk including evaluating suicidal ideation, plan, intent, suicidal or self-harm behaviors, risk factors, and protective factors. This judgment is based on our ability to directly address suicide risk, implement suicide prevention strategies and develop a safety plan while the patient is in the clinical setting. Please contact our team if there is a concern that risk level has changed.   ## Medications:  - Lithium 150 mg at bedtime was restarted today, will continue, patient was prescribed lithium by outpatient psychiatrist for depression and anxiety.  Continue Viibryd 20 mg daily for depression and anxiety, home medication prescribed by outpatient psychiatrist    Patient does not present overtly psychotic and denies any AVH or psychosis, it is quite possible that patient's intermittent hallucination was secondary to delirium, recommend no medication changes at this time, I would not start patient on any antipsychotic at this time unless more concerning report of hallucinations noted, otherwise recommend outpatient follow-up with primary psychiatrist for further follow-up.  ## Medical Decision Making Capacity:  Not assessed at this time  ## Further Work-up:  -- Defer to primary treating team  Recommend to check lithium level in 3 to 5 days if still at hospital, order was entered  -- most recent EKG on 1/22 had QtC of 417 -- Pertinent labwork reviewed earlier this admission includes: Low potassium level 3.1 on 2/3, improving BUN/creatinine, 28/1.23 on 2/3, lithium level less than 0.06 on 2/3  ## Disposition:  -- Patient does not meet criteria for inpatient psychiatric hospitalization Recommend outpatient follow-up with primary psychiatrist after discharge  ## Behavioral / Environmental:  --  As above   Thank  you for this consult request. Recommendations have been communicated to the primary team.  We will follow-up at this time.   Jake Mcconahy Winfred Leeds, MD   NEW history  Relevant Aspects of Hospital Course:  Admitted on 07/11/2022 for coffee-ground emesis, AKI, acute hyponatremia.  Patient Report:  History of diagnosed depression reported by patient, outpatient follow-up with psychiatrist in [place] area last seen 2 months ago.  Patient reports outpatient medications including lithium and Viibryd, has been on the same regimen for at least 1 year, he reports last time felt depressed was at least 6 months ago and denies feeling depressed since then, denies any problem with sleep or appetite, denies any decreased energy level or decreased motivation or interest, denies feeling hopeless or helpless, denies passive or active SI intention or plan, denies HI or AVH.  He denies symptoms consistent with mania or hypomania currently or in the past.  He does admit to episodes of confusion recently while in the hospital related to his medical problem.  When asking patient if during episodes of confusion he had trouble hearing voices or seeing things he denied, when reported to him wife concerned about intermittent hallucination he denied any history of hallucinations.  He presents linear with organized thought process, no overt psychosis, does not appear responding to stimuli.  ROS:  As above  Collateral information:  Attempted to contact patient's wife at least twice but no response.  Chart review indicates no staff concerns regarding any episodes of agitation or aggression or overt psychosis or hallucinations to be reported.  Psychiatric History:  Information collected from patient and chart review Patient admits to diagnosis of depression and anxiety, outpatient follow-up with psychiatric provider in [place], last visit 2 months ago.  He denies history of psychiatric hospitalizations or suicide  attempts.  Family psych history: Reports mother had bipolar disorder   Social History:  Married for 56 years, has 1 son and 1 daughter, 4 grandchildren and 1 great grandchild, used to run Electrical engineer business, denies legal charges  Tobacco use: Denies Alcohol use: Denies Drug use: Denies  Family History:   The patient's family history includes Heart disease in his father.  Medical History: Past Medical History:  Diagnosis Date   Arthritis    Cancer ([place])    skin melanoma   Depression    Diverticulitis    2 bouts in 10 years   Hyperlipemia    Hypertension    Hypothyroid    Scoliosis    Thyroid disease     Surgical History: Past Surgical History:  Procedure Laterality Date   APPENDECTOMY     CATARACT EXTRACTION W/ INTRAOCULAR LENS IMPLANT Bilateral    COLONOSCOPY     EYE SURGERY     HERNIA REPAIR     KNEE ARTHROSCOPY Right     x2   KNEE SURGERY Left    LAPAROSCOPIC APPENDECTOMY  05/28/2011   Procedure: APPENDECTOMY LAPAROSCOPIC;  Surgeon: Edward Jolly, MD;  Location: [facility];  Service: General;  Laterality: N/A;   PAROTID GLAND TUMOR EXCISION     ROOT CANAL     ROTATOR CUFF REPAIR     bil    SALIVARY GLAND SURGERY     L side   SKIN CANCER EXCISION     on his back and was benign   TOTAL HIP ARTHROPLASTY Left 04/14/2019   Procedure: TOTAL HIP ARTHROPLASTY ANTERIOR APPROACH;  Surgeon: Gaynelle Arabian, MD;  Location: [facility];  Service: Orthopedics;  Laterality: Left;  153mn   TOTAL KNEE ARTHROPLASTY Right 07/08/2022   Procedure: TOTAL KNEE ARTHROPLASTY;  Surgeon: AGaynelle Arabian MD;  Location: [facility];  Service: Orthopedics;  Laterality: Right;    Medications:   Current Facility-Administered Medications:    acetaminophen (TYLENOL) tablet 650 mg, 650 mg, Oral, Q6H PRN **OR** acetaminophen (TYLENOL) suppository 650 mg, 650 mg, Rectal, Q6H PRN, MClarene Essex MD   Chlorhexidine Gluconate Cloth 2 % PADS 6 each, 6 each, Topical, Daily, MClarene Essex MD, 6  each at 07/14/22 0951   levothyroxine (SYNTHROID) tablet 100 mcg, 100 mcg, Oral, Q0600, MClarene Essex MD, 100 mcg at 07/14/22 0539   lithium carbonate capsule 150 mg, 150 mg, Oral, QHS, GSamuella Cota MD   melatonin tablet 10 mg, 10 mg, Oral, QHS, Magod, MAltamese Dilling MD, 10 mg at 07/13/22 2141   ondansetron (ZOFRAN) injection 4 mg, 4 mg, Intravenous, Q6H PRN, MClarene Essex MD   pantoprazole (PROTONIX) EC tablet 40 mg, 40 mg, Oral, BID, GSamuella Cota MD, 40 mg at 07/14/22 0950   polyethylene glycol (MIRALAX / GLYCOLAX) packet 17 g, 17 g, Oral, BID, GSamuella Cota MD, 17 g at 07/14/22 0950   senna (SENOKOT) tablet 8.6 mg, 1 tablet, Oral, QHS, GSamuella Cota MD, 8.6 mg at 07/13/22 2141   silodosin (RAPAFLO) capsule 8 mg, 8 mg, Oral, Q breakfast, GSamuella Cota MD, 8 mg at 07/14/22 1233   sodium chloride flush (NS) 0.9 % injection 3 mL, 3 mL, Intravenous, Q12H, MClarene Essex MD, 3 mL at 07/14/22 0951   Vilazodone HCl TABS 20 mg, 20 mg, Oral, Q1200, GSamuella Cota MD, 20 mg at 07/14/22 02035 Allergies: Allergies  Allergen Reactions   Sulfa Antibiotics     SKatherina Rightsyndrome        Objective  Vital signs:  Temp:  [98.6 F (37 C)-99 F (37.2 C)] 99 F (37.2 C) (02/04 0454) Pulse Rate:  [66-86] 66 (02/04 0454) Resp:  [17-18] 17 (02/04 0454) BP: (149-163)/(52-61) 163/61 (02/04 0454) SpO2:  [97 %-98 %] 97 % (02/04 0454) Weight:  [84.7 kg] 84.7 kg (02/04 0500)  Psychiatric Specialty Exam:  Presentation  General Appearance: Fairly Groomed (Dressed in hospital gown)  Eye Contact:Minimal  Speech:Clear and Coherent  Speech Volume:Decreased  Handedness:No data recorded  Mood and Affect  Mood:Irritable  Affect:Congruent   Thought Process  Thought Processes:Goal Directed; Linear  Descriptions of Associations:Intact  Orientation:Full (Time, Place and Person)  Thought Content:Logical  History of Schizophrenia/Schizoaffective disorder:No data  recorded Duration of Psychotic Symptoms:No data recorded Hallucinations:Hallucinations: None  Ideas of Reference:None  Suicidal Thoughts:Suicidal Thoughts: No  Homicidal Thoughts:Homicidal Thoughts: No   Sensorium  Memory:Immediate Fair; Remote Fair; Recent Fair  Judgment:Fair  Insight:Fair   Executive Functions  Concentration:Fair  Attention Span:Fair  R[place]  Psychomotor Activity  Psychomotor Activity:Psychomotor Activity: Decreased   Assets  Assets:Desire for Improvement   Sleep  Sleep:No data recorded   Physical Exam: Physical Exam ROS Blood pressure (!) 163/61, pulse 66, temperature 99 F (37.2 C), temperature source Oral, resp. rate 17, height '5\' 10"'$  (1.778 m), weight 84.7 kg, SpO2 97 %. Body mass index is 26.8 kg/m.

## 2022-07-14 NOTE — Progress Notes (Signed)
Pt still hasn't voided since foley catheter was discontinued. Assisted pt to stand, walk to the bathroom, and sit on the toilet bowl for 10 minutes. Pt still unable to urinate. Bladder scan done and showed 490 ml of urine. On call J. Olena Heckle, NP notified. In and out done as ordered. Irrigated 500 ml of urine. Will continue to monitor.

## 2022-07-14 NOTE — Progress Notes (Signed)
Progress Note   Patient: Jake Collins:[id number] DOB: 07-11-1945 DOA: 07/11/2022     3 DOS: the patient was seen and examined on 07/14/2022   Brief hospital course: 77 year old man status post right total knee arthroplasty on 1/29 who presented with coffee-ground emesis.  Admitted for same, AKI, acute hyponatremia and encephalopathy.  Assessment and Plan: UGIB with coffee ground emesis with ABLA Episodes prior to admission.  Has been on ASA 325 mg BID for only a couple days after recent right TKA. Hemoglobin 13.8 >> 10.5 over 48-hour period.  Aspirin on hold, can resume in a day or two.  Continue Protonix.  Status post EGD showing moderately severe reflux esophagitis with no bleeding. Hemoglobin stable. PPI twice daily for 1 month and then once a day thereafter.  Can resume aspirin in 2 days.  Return to GI clinic in 1 month.   Acute kidney injury: BUN 80 and creatinine 3.03 compared to BUN 19/creatinine 1.23 two days prior to admission.   Suspect prerenal and postrenal based on history of poor oral intake and ultrasound findings suggesting chronic bladder outlet obstruction.. Creatinine back to normal.     Acute urinary retention.  Foley catheter removed yesterday.  Some difficulty voiding.  Start Rapaflo.  Monitor urine output.  In-N-Out cath as needed.   Hyponatremia: Acute hyponatremia with sodium down to 124 compared to 138 on 1/30.  Multifactorial but probably mostly from poor oral intake and diuretic.   Resolved now with IV fluids.   Acute metabolic encephalopathy: Favor delirium. Was present on discharge per report.  Probably multifactorial related to uremia, hyponatremia, medication effect.   Wife concerned about intermittent hallucinations.  Patient has long-term outpatient psychiatrist.  She requested inpatient psychiatry consultation. Better today.  No hallucinations today.  Follow-up psychiatry consultation.   Leukocytosis: WBC 18 on admission, resolved spontaneously.   Consistent with stress demarginization from surgery   S/p right TKA 07/08/2022: Performed by Dr. Wynelle Link.  Expected postoperative changes noted at knee.  Continue to monitor for postop complication.  SCDs for VTE prophylaxis since ASA is on hold as above.   Essential hypertension: Stable.  Holding HCTZ with AKI, holding Bystolic with low normal BP.   Hypothyroidism: Continue Synthroid.   GAD/depression: Has been on lithium chronically, will hold given AKI and hyponatremia.   Can resume lithium and vilazodone.  Lithium level was undetectable.   Constipation seen on AXR Bowel regimen       Subjective:  Better today Voiding independently but slow, with some trouble Eating ok No hallucinations today  Physical Exam: Vitals:   07/13/22 2009 07/13/22 2144 07/14/22 0454 07/14/22 0500  BP:  (!) 149/52 (!) 163/61   Pulse:  86 66   Resp:  18 17   Temp:  98.6 F (37 C) 99 F (37.2 C)   TempSrc:  Oral Oral   SpO2:  98% 97%   Weight:    84.7 kg  Height: '5\' 10"'$  (1.778 m)      Physical Exam Vitals reviewed.  Constitutional:      General: He is not in acute distress.    Appearance: He is not ill-appearing or toxic-appearing.  Cardiovascular:     Rate and Rhythm: Normal rate and regular rhythm.     Heart sounds: No murmur heard. Pulmonary:     Effort: Pulmonary effort is normal. No respiratory distress.     Breath sounds: No wheezing, rhonchi or rales.  Neurological:     Mental Status: He is alert and  oriented to person, place, and time.  Psychiatric:        Mood and Affect: Mood normal.        Behavior: Behavior normal.     Data Reviewed: No new data UOP 1300+  Family Communication: wife at bedside  Disposition: Status is: Inpatient Remains inpatient appropriate because: confusion  Planned Discharge Destination: Home    Time spent: 20 minutes  Author: Murray Hodgkins, MD 07/14/2022 8:30 AM  For on call review [email or web address].

## 2022-07-14 NOTE — Progress Notes (Signed)
Physical Therapy Treatment Patient Details Name: Jake Collins MRN: [id number] DOB: [birth year] Today's Date: 07/14/2022   History of Present Illness 77 year old man status post right total knee arthroplasty on 1/29, DC on 1/30,  who presented with coffee-ground emesis 07/11/22.  Admitted for same, AKI, acute hyponatremia and encephalopathy.   PMH: HLD, HTN,scoliosis, LTHA    PT Comments    3 attempts to see pt today; during session  after 3rd attempt psychiatrist came to see pt. PT departed and left pt EOB with MD. On return check pt back in bed and c/o fatigued; will continue efforts to  mobilize  Recommendations for follow up therapy are one component of a multi-disciplinary discharge planning process, led by the attending physician.  Recommendations may be updated based on patient status, additional functional criteria and insurance authorization.  Follow Up Recommendations  Outpatient PT     Assistance Recommended at Discharge Frequent or constant Supervision/Assistance  Patient can return home with the following A little help with walking and/or transfers;A little help with bathing/dressing/bathroom;Assistance with cooking/housework;Assist for transportation;Help with stairs or ramp for entrance   Equipment Recommendations  None recommended by PT    Recommendations for Other Services       Precautions / Restrictions Precautions Precautions: Fall;Knee Precaution Comments: Hx of fall (recent), Restrictions Weight Bearing Restrictions: No RLE Weight Bearing: Weight bearing as tolerated     Mobility  Bed Mobility Overal bed mobility: Needs Assistance Bed Mobility: Supine to Sit     Supine to sit: Min guard     General bed mobility comments: instructed in use of gait belt to assist self; excessive time and effort  to complete transition    Transfers                   General transfer comment: psychiatrist came to see pt, pt EOB, advised pt not to get up with MD  present    Ambulation/Gait               General Gait Details:  (unable d/t MD visit)   Stairs             Wheelchair Mobility    Modified Rankin (Stroke Patients Only)       Balance   Sitting-balance support: Feet supported, No upper extremity supported Sitting balance-Leahy Scale: Good                                      Cognition Arousal/Alertness: Awake/alert Behavior During Therapy: Flat affect   Area of Impairment: Problem solving, Following commands                       Following Commands: Follows one step commands with increased time, Follows multi-step commands inconsistently     Problem Solving: Slow processing, Decreased initiation, Requires verbal cues General Comments: alert however very slow to process        Exercises Total Joint Exercises Ankle Circles/Pumps: AROM, Both, 10 reps Quad Sets: Right, 10 reps, AAROM Straight Leg Raises: AAROM, Right, 10 reps    General Comments        Pertinent Vitals/Pain Pain Assessment Pain Assessment: Faces Faces Pain Scale: Hurts a little bit Pain Location: right knee Pain Descriptors / Indicators: Operative site guarding, Tender Pain Intervention(s): Limited activity within patient's tolerance, Monitored during session, Repositioned    Home Living  Prior Function            PT Goals (current goals can now be found in the care plan section) Acute Rehab PT Goals Patient Stated Goal: none stated PT Goal Formulation: Patient unable to participate in goal setting Time For Goal Achievement: 07/20/22 Potential to Achieve Goals: Good Progress towards PT goals: Progressing toward goals    Frequency    Min 6X/week      PT Plan Current plan remains appropriate    Co-evaluation              AM-PAC PT "6 Clicks" Mobility   Outcome Measure  Help needed turning from your back to your side while in a flat bed without  using bedrails?: A Little Help needed moving from lying on your back to sitting on the side of a flat bed without using bedrails?: A Little Help needed moving to and from a bed to a chair (including a wheelchair)?: A Lot Help needed standing up from a chair using your arms (e.g., wheelchair or bedside chair)?: A Lot Help needed to walk in hospital room?: A Lot Help needed climbing 3-5 steps with a railing? : A Lot 6 Click Score: 14    End of Session Equipment Utilized During Treatment: Gait belt Activity Tolerance: Patient limited by fatigue;Other (comment) (MD) Patient left: Other (comment);with bed alarm set;with family/visitor present (EOB with MD) Nurse Communication: Mobility status PT Visit Diagnosis: Unsteadiness on feet (R26.81);Difficulty in walking, not elsewhere classified (R26.2) Pain - Right/Left: Right Pain - part of body: Knee     Time: 7782-4235 PT Time Calculation (min) (ACUTE ONLY): 10 min  Charges:  $Therapeutic Activity: 8-22 mins                     Baxter Flattery, PT  Acute Rehab Dept [facility]) [phone]  [facility] Weekend Pager [facility] only)  [phone]  07/14/2022    [facility] 07/14/2022, 3:14 PM

## 2022-07-14 NOTE — Progress Notes (Signed)
Patient briefly seen no further vomiting trying to use the bathroom no new complaints but no vomiting no signs of bleeding and no new labs and please let us know if he we could be of any further assistance with this hospital stay otherwise follow-up with me in the office in 1 to 2 months and in 1 month can decrease his pump inhibitor to once a day

## 2022-07-15 DIAGNOSIS — N179 Acute kidney failure, unspecified: Secondary | ICD-10-CM | POA: Diagnosis not present

## 2022-07-15 DIAGNOSIS — K922 Gastrointestinal hemorrhage, unspecified: Secondary | ICD-10-CM

## 2022-07-15 DIAGNOSIS — D62 Acute posthemorrhagic anemia: Secondary | ICD-10-CM | POA: Diagnosis not present

## 2022-07-15 DIAGNOSIS — R410 Disorientation, unspecified: Secondary | ICD-10-CM

## 2022-07-15 MED ORDER — PANTOPRAZOLE SODIUM 40 MG PO TBEC: DELAYED_RELEASE_TABLET | ORAL | 0 refills | Status: DC

## 2022-07-15 MED ORDER — SILODOSIN 8 MG PO CAPS: 8.0000 mg | ORAL_CAPSULE | Freq: Every day | ORAL | 2 refills | Status: AC

## 2022-07-15 NOTE — Consult Note (Signed)
[facility] New Face-to-Face Psychiatric Evaluation   Service Date: July 15, 2022 LOS:  LOS: 4 days    Assessment  GALAN GHEE is a 77 y.o. male admitted medically for 07/11/2022  9:09 PM for coffee-ground emesis, AKI, acute hyponatremia and encephalopathy. He carries the psychiatric diagnoses of GAD and depression and has a past medical history of hypertension, hypothyroidism.Psychiatry was consulted for wife requested inpatient psychiatric consultation, patient has long-term outpatient psychiatrist, wife concerned about intermittent hallucinations, consult was requested by by primary treating provider.    His current presentation of no abnormalities noted but reported confusion at time of admission is most consistent with delirium.  Current outpatient psychotropic medications include lithium and Viibryd, lithium was held after admission for elevated BUN and creatinine, was restarted today by primary treating provider, and historically he has had a positive response to these medications. He was compliant with medications prior to admission as evidenced by patient's report. On initial examination, patient denies any depression or anxiety symptoms or mood instability, denies AVH or psychosis, admits to episodes of confusion consistent with delirium. Please see plan below for detailed recommendations.   Patient was observed face to face in his room sitting in a recliner casually dressed. He was cooperative and pleasant during the interview and made good eye contacts. He states that his mood is "fine", and his affect is appropriate. His speech is clear, coherent and logical. He denies suicidal /homicidal ideation, hallucination and psychosis. He is alert and oriented x  4, states that he does not have delirium but endorses having  " little forgetfulness". He was not able to spell the word 'world' backwards and  do serial 7's. Evaluating patient, no signs of psychosis, irritability,  delusion nor hallucination was observed. He continues on Lithium 150 mg at bedtime and Vilazodone 20 mg, and he denies side effects, also states that he follows up with Dr Clovis Pu for his mental health care.  Attempted to contact wife Keveon Amsler, however unsuccessfu. (Phone rings and goes to voicemail).    Diagnoses:  Active Hospital problems: Principal Problem:   Coffee ground emesis Active Problems:   Hypothyroid   Depression   GAD (generalized anxiety disorder)   History of total knee arthroplasty, right   AKI (acute kidney injury) ([place])   Normocytic anemia   Leukocytosis   Acute metabolic encephalopathy   Essential hypertension     Plan  ## Safety and Observation Level:  - Based on my clinical evaluation, I estimate the patient to be at minimal acute risk of self harm in the current setting - At this time, we recommend no change to current level of observation. This decision is based on my review of the chart including patient's history and current presentation, interview of the patient, mental status examination, and consideration of suicide risk including evaluating suicidal ideation, plan, intent, suicidal or self-harm behaviors, risk factors, and protective factors. This judgment is based on our ability to directly address suicide risk, implement suicide prevention strategies and develop a safety plan while the patient is in the clinical setting. Please contact our team if there is a concern that risk level has changed.   ## Medications:  - Lithium 150 mg at bedtime was restarted today, will continue, patient was prescribed lithium by outpatient psychiatrist for depression and anxiety.  Patient has expressed to limit medication adjustments on outpatient psychotropic medication, considers himself to be stable and effective at current dosing.  Continue Viibryd 20 mg daily for depression  and anxiety, home medication prescribed by outpatient psychiatrist    Patient does not  present overtly psychotic and denies any AVH or psychosis, it is quite possible that patient's intermittent hallucination was secondary to delirium, recommend no medication changes at this time, I would not start patient on any antipsychotic at this time unless more concerning report of hallucinations noted, otherwise recommend outpatient follow-up with primary psychiatrist for further follow-up.  ## Medical Decision Making Capacity:  Not assessed at this time  ## Further Work-up:  -- Defer to primary treating team  Recommend to check lithium level in 3 to 5 days if still at hospital, order was entered  -- most recent EKG on 1/22 had QtC of 417 -- Pertinent labwork reviewed earlier this admission includes: Low potassium level 3.1 on 2/3, improving BUN/creatinine, 28/1.23 on 2/3, lithium level less than 0.06 on 2/3  ## Disposition:  -- Patient does not meet criteria for inpatient psychiatric hospitalization Recommend outpatient follow-up with primary psychiatrist after discharge  ## Behavioral / Environmental:  -- As above   Thank you for this consult request. Recommendations have been communicated to the primary team.  We will sign off at this time.   Suella Broad, FNP   NEW history  Relevant Aspects of Hospital Course:  Admitted on 07/11/2022 for coffee-ground emesis, AKI, acute hyponatremia.  Patient Report:  History of diagnosed depression reported by patient, outpatient follow-up with psychiatrist in [place] area last seen 2 months ago.  Patient reports outpatient medications including lithium and Viibryd, has been on the same regimen for at least 1 year, he reports last time felt depressed was at least 6 months ago and denies feeling depressed since then, denies any problem with sleep or appetite, denies any decreased energy level or decreased motivation or interest, denies feeling hopeless or helpless, denies passive or active SI intention or plan, denies HI or AVH.  He  denies symptoms consistent with mania or hypomania currently or in the past.  He does admit to episodes of confusion recently while in the hospital related to his medical problem.  When asking patient if during episodes of confusion he had trouble hearing voices or seeing things he denied, when reported to him wife concerned about intermittent hallucination he denied any history of hallucinations.  He presents linear with organized thought process, no overt psychosis, does not appear responding to stimuli.  ROS:  As above  Collateral information:  Attempted to contact patient's wife at least twice but no response.  Chart review indicates no staff concerns regarding any episodes of agitation or aggression or overt psychosis or hallucinations to be reported.  Psychiatric History:  Information collected from patient and chart review Patient admits to diagnosis of depression and anxiety, outpatient follow-up with psychiatric provider in [place], last visit 2 months ago.  He denies history of psychiatric hospitalizations or suicide attempts.  Family psych history: Reports mother had bipolar disorder   Social History:  Married for 85 years, has 1 son and 1 daughter, 4 grandchildren and 1 great grandchild, used to run Electrical engineer business, denies legal charges  Tobacco use: Denies Alcohol use: Denies Drug use: Denies  Family History:   The patient's family history includes Heart disease in his father.  Medical History: Past Medical History:  Diagnosis Date   Arthritis    Cancer ([place])    skin melanoma   Depression    Diverticulitis    2 bouts in 10 years   Hyperlipemia    Hypertension  Hypothyroid    Scoliosis    Thyroid disease     Surgical History: Past Surgical History:  Procedure Laterality Date   APPENDECTOMY     CATARACT EXTRACTION W/ INTRAOCULAR LENS IMPLANT Bilateral    COLONOSCOPY     ESOPHAGOGASTRODUODENOSCOPY N/A 07/12/2022   Procedure:  ESOPHAGOGASTRODUODENOSCOPY (EGD);  Surgeon: Clarene Essex, MD;  Location: [facility];  Service: Gastroenterology;  Laterality: N/A;   EYE SURGERY     HERNIA REPAIR     KNEE ARTHROSCOPY Right     x2   KNEE SURGERY Left    LAPAROSCOPIC APPENDECTOMY  05/28/2011   Procedure: APPENDECTOMY LAPAROSCOPIC;  Surgeon: Edward Jolly, MD;  Location: [facility];  Service: General;  Laterality: N/A;   PAROTID GLAND TUMOR EXCISION     ROOT CANAL     ROTATOR CUFF REPAIR     bil    SALIVARY GLAND SURGERY     L side   SKIN CANCER EXCISION     on his back and was benign   TOTAL HIP ARTHROPLASTY Left 04/14/2019   Procedure: TOTAL HIP ARTHROPLASTY ANTERIOR APPROACH;  Surgeon: Gaynelle Arabian, MD;  Location: [facility];  Service: Orthopedics;  Laterality: Left;  148mn   TOTAL KNEE ARTHROPLASTY Right 07/08/2022   Procedure: TOTAL KNEE ARTHROPLASTY;  Surgeon: AGaynelle Arabian MD;  Location: [facility];  Service: Orthopedics;  Laterality: Right;    Medications:   Current Facility-Administered Medications:    acetaminophen (TYLENOL) tablet 650 mg, 650 mg, Oral, Q6H PRN **OR** acetaminophen (TYLENOL) suppository 650 mg, 650 mg, Rectal, Q6H PRN, MClarene Essex MD   levothyroxine (SYNTHROID) tablet 100 mcg, 100 mcg, Oral, Q0600, MClarene Essex MD, 100 mcg at 07/15/22 07824  lithium carbonate capsule 150 mg, 150 mg, Oral, QHS, GSamuella Cota MD, 150 mg at 07/14/22 2028   melatonin tablet 10 mg, 10 mg, Oral, QHS, Magod, MAltamese Dilling MD, 10 mg at 07/14/22 2028   ondansetron (ZOFRAN) injection 4 mg, 4 mg, Intravenous, Q6H PRN, MClarene Essex MD   pantoprazole (PROTONIX) EC tablet 40 mg, 40 mg, Oral, BID, GSamuella Cota MD, 40 mg at 07/14/22 2028   polyethylene glycol (MIRALAX / GLYCOLAX) packet 17 g, 17 g, Oral, BID, GSamuella Cota MD, 17 g at 07/14/22 2028   senna (SENOKOT) tablet 8.6 mg, 1 tablet, Oral, QHS, GSamuella Cota MD, 8.6 mg at 07/14/22 2028   silodosin (RAPAFLO) capsule 8 mg, 8 mg, Oral, Q breakfast,  GSamuella Cota MD, 8 mg at 07/14/22 1233   sodium chloride flush (NS) 0.9 % injection 3 mL, 3 mL, Intravenous, Q12H, MClarene Essex MD, 3 mL at 07/14/22 2032   Vilazodone HCl TABS 20 mg, 20 mg, Oral, Q1200, GSamuella Cota MD, 20 mg at 07/14/22 02353 Allergies: Allergies  Allergen Reactions   Sulfa Antibiotics     SKatherina Rightsyndrome        Objective  Vital signs:  Temp:  [97.4 F (36.3 C)-98.1 F (36.7 C)] 97.4 F (36.3 C) (02/05 0453) Pulse Rate:  [81-100] 81 (02/05 0453) Resp:  [18-20] 20 (02/05 0453) BP: (142-172)/(53-73) 172/73 (02/05 0453) SpO2:  [96 %-99 %] 97 % (02/05 0453) Weight:  [87 kg] 87 kg (02/05 0500)  Psychiatric Specialty Exam:  Presentation  General Appearance: Well Groomed  Eye Contact:Minimal  Speech:Clear and Coherent; Normal Rate  Speech Volume:Normal  Handedness:Right   Mood and Affect  Mood:Irritable  Affect:Appropriate   Thought Process  Thought Processes:Coherent; Linear; Goal Directed  Descriptions of Associations:Intact  Orientation:Full (Time, Place and Person)  Thought Content:Logical  History of Schizophrenia/Schizoaffective disorder:No data recorded Duration of Psychotic Symptoms:No data recorded Hallucinations:Hallucinations: None  Ideas of Reference:None  Suicidal Thoughts:Suicidal Thoughts: No  Homicidal Thoughts:Homicidal Thoughts: No   Sensorium  Memory:Recent Fair  Judgment:Fair  Insight:Fair   Executive Functions  Concentration:Fair  Attention Span:Good  [place]   Psychomotor Activity  Psychomotor Activity:Psychomotor Activity: Normal   Assets  Assets:Desire for Improvement; Armed forces logistics/support/administrative officer; Financial Resources/Insurance   Sleep  Sleep:Sleep: Fair    Physical Exam: Physical Exam Constitutional:      Appearance: Normal appearance.  Neurological:     General: No focal deficit present.     Mental Status: He is alert and  oriented to person, place, and time. Mental status is at baseline.  Psychiatric:        Attention and Perception: Attention and perception normal.        Mood and Affect: Mood and affect normal.        Speech: Speech normal.        Behavior: Behavior normal. Behavior is cooperative.        Thought Content: Thought content normal.        Cognition and Memory: Cognition and memory normal.        Judgment: Judgment normal.    Review of Systems  Psychiatric/Behavioral: Negative.  Negative for depression, hallucinations, memory loss, substance abuse and suicidal ideas. The patient is not nervous/anxious and does not have insomnia.    Blood pressure (!) 172/73, pulse 81, temperature (!) 97.4 F (36.3 C), temperature source Oral, resp. rate 20, height '5\' 10"'$  (1.778 m), weight 87 kg, SpO2 97 %. Body mass index is 27.52 kg/m.

## 2022-07-15 NOTE — Anesthesia Postprocedure Evaluation (Signed)
Anesthesia Post Note  Patient: Jake Collins  Procedure(s) Performed: ESOPHAGOGASTRODUODENOSCOPY (EGD)     Patient location during evaluation: PACU Anesthesia Type: MAC Level of consciousness: awake and alert Pain management: pain level controlled Vital Signs Assessment: post-procedure vital signs reviewed and stable Respiratory status: spontaneous breathing, nonlabored ventilation, respiratory function stable and patient connected to nasal cannula oxygen Cardiovascular status: stable and blood pressure returned to baseline Postop Assessment: no apparent nausea or vomiting Anesthetic complications: no   No notable events documented.  Last Vitals:  Vitals:   07/14/22 2018 07/15/22 0453  BP: (!) 159/62 (!) 172/73  Pulse: 100 81  Resp: 18 20  Temp: 36.6 C (!) 36.3 C  SpO2: 96% 97%    Last Pain:  Vitals:   07/15/22 0453  TempSrc: Oral  PainSc:                  [place]

## 2022-07-15 NOTE — Progress Notes (Signed)
Physical Therapy Treatment Patient Details Name: Jake Collins MRN: [id number] DOB: September 15, 1945 Today's Date: 07/15/2022   History of Present Illness 77 year old man status post right total knee arthroplasty on 1/29, DC on 1/30,  who presented with coffee-ground emesis 07/11/22.  Admitted for same, AKI, acute hyponatremia and encephalopathy.   PMH: HLD, HTN,scoliosis, LTHA    PT Comments    Pt assisted with ambulating in hallway and requiring cues for safety.  Pt appears confused this morning, which wife also confirmed on the phone.  Pt needs to be fairly independent to return home per spouse as she is not able to physically assist him.    Recommendations for follow up therapy are one component of a multi-disciplinary discharge planning process, led by the attending physician.  Recommendations may be updated based on patient status, additional functional criteria and insurance authorization.  Follow Up Recommendations  Home health PT (for safety)     Assistance Recommended at Discharge Frequent or constant Supervision/Assistance  Patient can return home with the following A little help with walking and/or transfers;A little help with bathing/dressing/bathroom;Assistance with cooking/housework;Assist for transportation;Help with stairs or ramp for entrance   Equipment Recommendations  None recommended by PT    Recommendations for Other Services       Precautions / Restrictions Precautions Precautions: Fall;Knee Precaution Comments: Hx of fall (recent) Restrictions RLE Weight Bearing: Weight bearing as tolerated     Mobility  Bed Mobility Overal bed mobility: Needs Assistance Bed Mobility: Supine to Sit     Supine to sit: Min guard     General bed mobility comments: instructed in use of gait belt to assist self; excessive time and effort to complete transition    Transfers Overall transfer level: Needs assistance Equipment used: Rolling walker (2 wheels) Transfers: Sit  to/from Stand Sit to Stand: Min guard           General transfer comment: required cues for safety    Ambulation/Gait Ambulation/Gait assistance: Min guard Gait Distance (Feet): 120 Feet Assistive device: Rolling walker (2 wheels) Gait Pattern/deviations: Step-through pattern, Trunk flexed, Decreased stance time - left, Antalgic Gait velocity: decreased     General Gait Details: cues for use of RW which pt did not follow, recliner following for safety however not needed   Stairs             Wheelchair Mobility    Modified Rankin (Stroke Patients Only)       Balance                                            Cognition Arousal/Alertness: Awake/alert Behavior During Therapy: Flat affect Overall Cognitive Status: Impaired/Different from baseline Area of Impairment: Safety/judgement                       Following Commands: Follows one step commands with increased time, Follows multi-step commands inconsistently     Problem Solving: Slow processing, Decreased initiation, Requires verbal cues General Comments: confused this morning, asking to call wife so he can d/c, spouse called so pt could talk with her, she also reports this is the most confused he has been        Exercises      General Comments        Pertinent Vitals/Pain Pain Assessment Pain Assessment: Faces Faces Pain Scale: Hurts a little bit  Pain Location: right knee Pain Descriptors / Indicators: Operative site guarding, Tender Pain Intervention(s): Repositioned, Monitored during session    Home Living                          Prior Function            PT Goals (current goals can now be found in the care plan section) Progress towards PT goals: Progressing toward goals    Frequency    Min 6X/week      PT Plan Current plan remains appropriate    Co-evaluation              AM-PAC PT "6 Clicks" Mobility   Outcome Measure  Help  needed turning from your back to your side while in a flat bed without using bedrails?: A Little Help needed moving from lying on your back to sitting on the side of a flat bed without using bedrails?: A Little Help needed moving to and from a bed to a chair (including a wheelchair)?: A Lot Help needed standing up from a chair using your arms (e.g., wheelchair or bedside chair)?: A Lot Help needed to walk in hospital room?: A Lot Help needed climbing 3-5 steps with a railing? : A Lot 6 Click Score: 14    End of Session Equipment Utilized During Treatment: Gait belt Activity Tolerance: Patient tolerated treatment well Patient left: in chair;with call bell/phone within reach;with chair alarm set;with nursing/sitter in room Nurse Communication: Mobility status PT Visit Diagnosis: Unsteadiness on feet (R26.81);Difficulty in walking, not elsewhere classified (R26.2)     Time: 0034-9179 PT Time Calculation (min) (ACUTE ONLY): 21 min  Charges:  $Gait Training: 8-22 mins                    Arlyce Dice, DPT Physical Therapist Acute Rehabilitation Services Preferred contact method: Secure Chat Weekend Pager Only: [phone] Office: [place] 07/15/2022, 1:39 PM

## 2022-07-15 NOTE — TOC Progression Note (Addendum)
Transition of Care [facility]) - Progression Note    Patient Details  Name: Jake Collins MRN: [id number] Date of Birth: January 15, 1946  [facility] CM/SW Contact  Pritesh Sobecki, Juliann Pulse, RN Phone Number: 07/15/2022, 12:46 PM  Clinical Narrative:   Just spoke to Jeanette(spouse) says she is unable to take to otpt PT;she also spoke to ortho office they recc HHPT. I can set up HHPT if appropriate w/eval, & orders MD updated. -1:22p-Per spouse already set up with through Dr. Anne Fu office-[facility]- rep Claiborne Billings aware.Otpt psych resources on AVS.    Expected Discharge Plan: [place] Barriers to Discharge: Continued Medical Work up  Expected Discharge Plan and Services                                               Social Determinants of Health (SDOH) Interventions SDOH Screenings   Food Insecurity: No Food Insecurity (07/12/2022)  Housing: Low Risk  (07/12/2022)  Transportation Needs: No Transportation Needs (07/12/2022)  Utilities: Not At Risk (07/12/2022)  Tobacco Use: Low Risk  (07/14/2022)    Readmission Risk Interventions     No data to display

## 2022-07-15 NOTE — Discharge Summary (Addendum)
Physician Discharge Summary   Patient: Jake Collins MRN: [id number] DOB: Feb 04, 1946  Admit date:     07/11/2022  Discharge date: 07/15/22  Discharge Physician: Murray Hodgkins   PCP: Alroy Dust, L.Marlou Sa, MD   Recommendations at discharge:   UGIB with coffee ground emesis with ABLA  Acute urinary retention.   S/p right TKA 07/08/2022:  Discharge Diagnoses: Principal Problem:   UGIB (upper gastrointestinal bleed) Active Problems:   Coffee ground emesis   AKI (acute kidney injury) ([place])   Hypothyroid   Depression   GAD (generalized anxiety disorder)   History of total knee arthroplasty, right   Normocytic anemia   Leukocytosis   Acute metabolic encephalopathy   Essential hypertension   ABLA (acute blood loss anemia)  Resolved Problems:   * No resolved hospital problems. *  Hospital Course: 77 year old man status post right total knee arthroplasty on 1/29 who presented with coffee-ground emesis.  Admitted for same, AKI, acute hyponatremia and encephalopathy.  Underwent EGD which showed gastritis.  Treated with PPI with no recurrent bleeding.  Hemoglobin stabilized without blood products.  Individual issues as below.  UGIB with coffee ground emesis with ABLA Episodes prior to admission.  Has been on ASA 325 mg BID for only a couple days after recent right TKA. Hemoglobin 13.8 >> 10.5 over 48-hour period.  Aspirin on hold.  Continue Protonix.  Status post EGD showing moderately severe reflux esophagitis with no bleeding. Hemoglobin stable. PPI twice daily for 1 month and then once a day thereafter.  Can resume aspirin in 2 days.  Return to GI clinic in 1 month. Resume ASA on discharge.   Acute kidney injury: BUN 80 and creatinine 3.03 compared to BUN 19/creatinine 1.23 two days prior to admission.   Suspect prerenal and postrenal based on history of poor oral intake and ultrasound findings suggesting chronic bladder outlet obstruction.. Creatinine back to normal.      Hyponatremia: Acute hyponatremia with sodium down to 124 compared to 138 on 1/30.  Multifactorial but probably mostly from poor oral intake and diuretic.   Resolved now with IV fluids.   Acute metabolic encephalopathy: Favor delirium. Was present on discharge per report.  Probably multifactorial related to post-anesthesia, uremia, hyponatremia, medication effect.   Wife was concerned about intermittent hallucinations.  Patient has long-term outpatient psychiatrist.  She requested inpatient psychiatry consultation -- pt seen and felt to be stable.   Expect delirium to gradually clear over time.  Acute urinary retention. Started on Rapaflo.  Follow-up as an outpatient with his urologist Dr. Amalia Hailey.   Leukocytosis: WBC 18 on admission, resolved spontaneously.  Consistent with stress demarginization from surgery   S/p right TKA 07/08/2022: Performed by Dr. Wynelle Link.  Expected postoperative changes noted at knee.  Continue to monitor for postop complication.  SCDs for VTE prophylaxis since ASA is on hold as above. Continue outpatient PT Area of redness noted, was evaluated by Dr. Posey Pronto and felt stable for discharge.   Essential hypertension: Stable. Will continue to hold HCTZ on discharge but can resume Bystolic    Hypothyroidism: Continue Synthroid.   GAD/depression: Has been on lithium chronically, was held given AKI and hyponatremia.   Can resume lithium and vilazodone tomorrow.  Lithium level was undetectable.   Constipation seen on AXR Bowel regimen   Hypokalemia     Consultants:  Psychiatry   Procedures performed:  None   Disposition: Home Diet recommendation:  Regular diet DISCHARGE MEDICATION: Allergies as of 07/15/2022  Reactions   Sulfa Antibiotics    Katherina Right syndrome         Medication List     STOP taking these medications    methocarbamol 500 MG tablet Commonly known as: ROBAXIN   mupirocin ointment 2 % Commonly known as:  BACTROBAN   oxyCODONE 5 MG immediate release tablet Commonly known as: Oxy IR/ROXICODONE       TAKE these medications    acetaminophen 500 MG tablet Commonly known as: TYLENOL Take 2 tablets (1,000 mg total) by mouth every 8 (eight) hours as needed. What changed: reasons to take this   aspirin EC 325 MG tablet Take 1 tablet (325 mg total) by mouth 2 (two) times daily for 21 days.   B-12 5000 MCG Caps Take 5,000 mcg by mouth daily.   cholecalciferol 25 MCG (1000 UNIT) tablet Commonly known as: VITAMIN D3 Take 1,000 Units by mouth daily.   EPINEPHrine 0.3 mg/0.3 mL Soaj injection Commonly known as: EPI-PEN Inject 0.3 mg into the muscle as needed for anaphylaxis.   famotidine 20 MG tablet Commonly known as: PEPCID Take 20 mg by mouth daily.   glucosamine-chondroitin 500-400 MG tablet Take 1 tablet by mouth daily.   hydrochlorothiazide 12.5 MG capsule Commonly known as: MICROZIDE Take 12.5 mg by mouth daily.   latanoprost 0.005 % ophthalmic solution Commonly known as: XALATAN Place 1 drop into both eyes at bedtime.   levothyroxine 100 MCG tablet Commonly known as: SYNTHROID Take 100 mcg by mouth daily.   lithium carbonate 150 MG capsule Take 1 capsule (150 mg total) by mouth at bedtime.   loratadine 10 MG tablet Commonly known as: CLARITIN Take 10 mg by mouth daily as needed for allergies.   Magnesium 250 MG Tabs Take 250 mg by mouth daily.   Melatonin 5 MG Chew Chew 10 mg by mouth at bedtime. Gummy tabs   nebivolol 5 MG tablet Commonly known as: BYSTOLIC Take 5 mg by mouth daily.   Omega 3 1200 MG Caps Take 1,200 mg by mouth daily.   OVER THE COUNTER MEDICATION Take 2 tablets by mouth daily. Neuriva Plus   pantoprazole 40 MG tablet Commonly known as: PROTONIX Take 1 tablet (40 mg total) by mouth 2 (two) times daily for 30 days, THEN 1 tablet (40 mg total) daily. Start taking on: July 15, 2022   pyridOXINE 100 MG tablet Commonly known as:  VITAMIN B6 Take 100 mg by mouth daily.   Repatha SureClick 161 MG/ML Soaj Generic drug: Evolocumab Inject 140 mg into the skin every 14 (fourteen) days.   silodosin 8 MG Caps capsule Commonly known as: RAPAFLO Take 1 capsule (8 mg total) by mouth daily with breakfast. Start taking on: July 16, 2022   Vilazodone HCl 20 MG Tabs Take 1 tablet (20 mg total) by mouth daily at 12 noon. What changed: additional instructions        Follow-up Information     [facility] Follow up.   Specialty: [place] Why: [facility]-already set up through Dr. Anne Fu office. Contact information: [address] [place] [address] [phone]         Gaynelle Arabian, MD Follow up.   Specialty: Orthopedic Surgery Why: Keep scheduled appointment Contact information: [address] [place] [address] [phone]         Domingo Pulse, MD. Schedule an appointment as soon as possible for a visit in 1 week(s).   Specialty: Urology  Contact information: [place] [address] [phone]         Clarene Essex, MD. Schedule an appointment as soon as possible for a visit in 1 month(s).   Specialty: Gastroenterology Contact information: [address] [place] [address] [phone]                Feels ok Voiding per RN  Discharge Exam: Filed Weights   07/13/22 0455 07/14/22 0500 07/15/22 0500  Weight: 82.4 kg 84.7 kg 87 kg   Physical Exam Vitals reviewed.  Constitutional:      General: He is not in acute distress.    Appearance: He is not ill-appearing or toxic-appearing.  Cardiovascular:     Rate and Rhythm: Normal rate and regular rhythm.     Heart sounds: No murmur heard. Pulmonary:     Effort: Pulmonary effort is normal. No respiratory distress.     Breath sounds: No wheezing, rhonchi or rales.  Skin:    General: Skin is warm.     Comments: Right knee  incision covered w/ dressing; medial to this is erythema and warmth.  Neurological:     Mental Status: He is alert.  Psychiatric:        Mood and Affect: Mood normal.        Behavior: Behavior normal.      Condition at discharge: good  The results of significant diagnostics from this hospitalization (including imaging, microbiology, ancillary and laboratory) are listed below for reference.   Imaging Studies: US RENAL  Result Date: 07/12/2022 CLINICAL DATA:  Vomiting, constipation EXAM: RENAL / URINARY TRACT ULTRASOUND COMPLETE COMPARISON:  CT abdomen/pelvis dated 05/28/2011 FINDINGS: Right Kidney: Renal measurements: 10.0 x 4.9 x 4.9 cm = volume: 125 mL. Echogenicity within normal limits. Mild right hydronephrosis. Left Kidney: Renal measurements: 11.2 x 6.2 x 5.2 cm = volume: 188 mL. Echogenicity within normal limits. Fullness of the left renal collecting system without frank hydronephrosis. Bladder: Decompressed by an indwelling Foley catheter Other: None. IMPRESSION: Mild right hydronephrosis. Fullness of the left renal collecting system without frank hydronephrosis. While nonspecific, these findings raise the possibility of chronic bladder outlet obstruction. Bladder decompressed by an indwelling Foley catheter. Electronically Signed   By: Julian Hy M.D.   On: 07/12/2022 00:29   Abd 1 View (KUB)  Result Date: 07/12/2022 CLINICAL DATA:  Vomiting for 48 hours EXAM: ABDOMEN - 1 VIEW COMPARISON:  CT abdomen and pelvis 05/28/2011 FINDINGS: The bowel gas pattern is normal. Moderate stool load greatest in the right colon. No radio-opaque calculi or other significant radiographic abnormality are seen. Left THA. Degenerative changes pubic symphysis, right hip, SI joints, lumbar spine. IMPRESSION: Nonobstructive bowel-gas pattern. Moderate colonic stool load. Correlate for constipation. Electronically Signed   By: Placido Sou M.D.   On: 07/12/2022 00:19    Microbiology: Results for orders  placed or performed during the hospital encounter of 07/01/22  Surgical pcr screen     Status: None   Collection Time: 07/01/22  3:03 PM   Specimen: Nasal Mucosa; Nasal Swab  Result Value Ref Range Status   MRSA, PCR NEGATIVE NEGATIVE Final   Staphylococcus aureus NEGATIVE NEGATIVE Final    Comment: (NOTE) The Xpert SA Assay (FDA approved for NASAL specimens in patients 37 years of age and older), is one component of a comprehensive surveillance program. It is not intended to diagnose infection nor to guide or monitor treatment. Performed at [facility], [place]  [address] [place], [place] [address]     Labs: CBC: Recent Labs  Lab 07/09/22 [phone] 07/11/22 2142 07/12/22 0422 07/12/22 1254 07/13/22 0522  WBC 18.0* 18.8* 14.3* 10.9* 8.9  NEUTROABS  --  16.1*  --   --  6.4  HGB 13.8 10.5* 8.9* 8.6* 8.5*  HCT 41.2 30.2* 26.7* 25.4* 26.2*  MCV 94.5 91.0 95.4 93.7 97.4  PLT 190 164 143* 159 643   Basic Metabolic Panel: Recent Labs  Lab 07/09/22 0337 07/11/22 2142 07/12/22 0422 07/13/22 0522  NA 138 124* 134* 139  K 4.3 3.5 3.2* 3.1*  CL 104 92* 105 110  CO2 23 20* 20* 24  GLUCOSE 125* 125* 103* 102*  BUN 19 80* 62* 28*  CREATININE 1.23 3.03* 2.02* 1.23  CALCIUM 8.4* 8.0* 8.2* 8.1*   Liver Function Tests: Recent Labs  Lab 07/11/22 2142  AST 28  ALT 13  ALKPHOS 44  BILITOT 0.9  PROT 6.0*  ALBUMIN 2.8*   CBG: No results for input(s): "GLUCAP" in the last 168 hours.  Discharge time spent: greater than 30 minutes.  Signed: Murray Hodgkins, MD [facility] 07/15/2022

## 2022-07-15 NOTE — TOC Transition Note (Signed)
Transition of Care [facility]) - CM/SW Discharge Note   Patient Details  Name: Jake Collins MRN: [id number] Date of Birth: [birth year]  [facility] CM/SW Contact:  Dessa Phi, RN Phone Number: 07/15/2022, 1:55 PM   Clinical Narrative: [name] will contact Dr. Anne Fu office for [place] orders. No further CM needs.      Final next level of care: [place] Barriers to Discharge: No Barriers Identified   Patient Goals and CMS Choice      Discharge Placement                         Discharge Plan and Services Additional resources added to the After Visit Summary for                            [facility] Arranged: PT [place]: [place]        Social Determinants of Health (SDOH) Interventions SDOH Screenings   Food Insecurity: No Food Insecurity (07/12/2022)  Housing: Low Risk  (07/12/2022)  Transportation Needs: No Transportation Needs (07/12/2022)  Utilities: Not At Risk (07/12/2022)  Tobacco Use: Low Risk  (07/14/2022)     Readmission Risk Interventions     No data to display

## 2022-07-15 NOTE — Progress Notes (Signed)
Physical Therapy Treatment Patient Details Name: Jake Collins MRN: [id number] DOB: 1945/07/31 Today's Date: 07/15/2022   History of Present Illness 77 year old man status post right total knee arthroplasty on 1/29, DC on 1/30,  who presented with coffee-ground emesis 07/11/22.  Admitted for same, AKI, acute hyponatremia and encephalopathy.   PMH: HLD, HTN,scoliosis, LTHA    PT Comments    Pt agreeable to OOB and mobilizing with min/guard for safety.  Pt continues to require cues for safety and positioning.  Pt with red area right medial knee near incision dressing and also warm to touch which Dr. Sarajane Jews is aware and states he requested Dr. Wynelle Link to assess.     Recommendations for follow up therapy are one component of a multi-disciplinary discharge planning process, led by the attending physician.  Recommendations may be updated based on patient status, additional functional criteria and insurance authorization.  Follow Up Recommendations  Home health PT (for safety)     Assistance Recommended at Discharge Frequent or constant Supervision/Assistance  Patient can return home with the following A little help with walking and/or transfers;A little help with bathing/dressing/bathroom;Assistance with cooking/housework;Assist for transportation;Help with stairs or ramp for entrance   Equipment Recommendations  None recommended by PT    Recommendations for Other Services       Precautions / Restrictions Precautions Precautions: Fall;Knee Precaution Comments: Hx of fall (recent) Restrictions RLE Weight Bearing: Weight bearing as tolerated     Mobility  Bed Mobility Overal bed mobility: Needs Assistance Bed Mobility: Supine to Sit     Supine to sit: Min guard     General bed mobility comments: pt used gait belt to self assist, still requires cues for self assist to complete transition    Transfers Overall transfer level: Needs assistance Equipment used: Rolling walker (2  wheels) Transfers: Sit to/from Stand Sit to Stand: Min guard           General transfer comment: required cues for safety and positioning, difficulty from regular height surface requiring 3 attempts    Ambulation/Gait Ambulation/Gait assistance: Min guard Gait Distance (Feet): 140 Feet Assistive device: Rolling walker (2 wheels) Gait Pattern/deviations: Step-through pattern, Trunk flexed, Decreased stance time - left, Antalgic Gait velocity: decreased     General Gait Details: cues for use of RW which pt did not follow   Stairs             Wheelchair Mobility    Modified Rankin (Stroke Patients Only)       Balance                                            Cognition Arousal/Alertness: Awake/alert Behavior During Therapy: Flat affect Overall Cognitive Status: Impaired/Different from baseline Area of Impairment: Safety/judgement                       Following Commands: Follows one step commands with increased time, Follows multi-step commands inconsistently     Problem Solving: Slow processing, Decreased initiation, Requires verbal cues General Comments: wants to go home, more cooperative with mobility this afternoon (but also wanted to get up to chair for lunch)        Exercises      General Comments        Pertinent Vitals/Pain Pain Assessment Pain Assessment: Faces Faces Pain Scale: Hurts a little bit Pain Location:  right knee Pain Descriptors / Indicators: Operative site guarding, Tender Pain Intervention(s): Repositioned, Monitored during session    Home Living                          Prior Function            PT Goals (current goals can now be found in the care plan section) Progress towards PT goals: Progressing toward goals    Frequency    Min 6X/week      PT Plan Current plan remains appropriate    Co-evaluation              AM-PAC PT "6 Clicks" Mobility   Outcome  Measure  Help needed turning from your back to your side while in a flat bed without using bedrails?: A Little Help needed moving from lying on your back to sitting on the side of a flat bed without using bedrails?: A Lot Help needed moving to and from a bed to a chair (including a wheelchair)?: A Lot Help needed standing up from a chair using your arms (e.g., wheelchair or bedside chair)?: A Lot Help needed to walk in hospital room?: A Lot Help needed climbing 3-5 steps with a railing? : A Lot 6 Click Score: 13    End of Session Equipment Utilized During Treatment: Gait belt Activity Tolerance: Patient tolerated treatment well Patient left: in chair;with call bell/phone within reach;with chair alarm set Nurse Communication: Mobility status PT Visit Diagnosis: Unsteadiness on feet (R26.81);Difficulty in walking, not elsewhere classified (R26.2)     Time: 4098-1191 PT Time Calculation (min) (ACUTE ONLY): 14 min  Charges:  $Gait Training: 8-22 mins                    Jannette Spanner PT, DPT Physical Therapist Acute Rehabilitation Services Preferred contact method: Secure Chat Weekend Pager Only: [phone] Office: [place] 07/15/2022, 3:03 PM

## 2022-07-17 ENCOUNTER — Telehealth: Payer: Self-pay | Admitting: Psychiatry

## 2022-07-17 NOTE — Discharge Summary (Signed)
Physician Discharge Summary   Patient ID: Jake Collins MRN: [id number] DOB/AGE: 07-13-1945 77 y.o.  Admit date: 07/08/2022 Discharge date: 07/09/2022  Primary Diagnosis: Right knee osteoarthritis   Admission Diagnoses:  Past Medical History:  Diagnosis Date   Arthritis    Cancer ([place])    skin melanoma   Depression    Diverticulitis    2 bouts in 10 years   Hyperlipemia    Hypertension    Hypothyroid    Scoliosis    Thyroid disease    Discharge Diagnoses:   Principal Problem:   OA (osteoarthritis) of knee Active Problems:   Primary osteoarthritis of right knee  Estimated body mass index is 25.4 kg/m as calculated from the following:   Height as of this encounter: '5\' 10"'$  (1.778 m).   Weight as of this encounter: 80.3 kg.  Procedure:  Procedure(s) (LRB): TOTAL KNEE ARTHROPLASTY (Right)   Consults: None  HPI: Jake Collins is a 77 y.o. year old male with end stage OA of his right knee with progressively worsening pain and dysfunction. He has constant pain, with activity and at rest and significant functional deficits with difficulties even with ADLs. He has had extensive non-op management including analgesics, injections of cortisone and viscosupplements, and home exercise program, but remains in significant pain with significant dysfunction. Radiographs show bone on bone arthritis lateral and patellofemoral. He presents now for right Total Knee Arthroplasty.      Laboratory Data: Admission on 07/08/2022, Discharged on 07/09/2022  Component Date Value Ref Range Status   WBC 07/09/2022 18.0 (H)  4.0 - 10.5 K/uL Final   RBC 07/09/2022 4.36  4.22 - 5.81 MIL/uL Final   Hemoglobin 07/09/2022 13.8  13.0 - 17.0 g/dL Final   HCT 07/09/2022 41.2  39.0 - 52.0 % Final   MCV 07/09/2022 94.5  80.0 - 100.0 fL Final   MCH 07/09/2022 31.7  26.0 - 34.0 pg Final   MCHC 07/09/2022 33.5  30.0 - 36.0 g/dL Final   RDW 07/09/2022 12.8  11.5 - 15.5 % Final   Platelets 07/09/2022 190   150 - 400 K/uL Final   nRBC 07/09/2022 0.0  0.0 - 0.2 % Final   Performed at [facility], [place] [address] [place], [place] [address]   Sodium 07/09/2022 138  135 - 145 mmol/L Final   Potassium 07/09/2022 4.3  3.5 - 5.1 mmol/L Final   Chloride 07/09/2022 104  98 - 111 mmol/L Final   CO2 07/09/2022 23  22 - 32 mmol/L Final   Glucose, Bld 07/09/2022 125 (H)  70 - 99 mg/dL Final   Glucose reference range applies only to samples taken after fasting for at least 8 hours.   BUN 07/09/2022 19  8 - 23 mg/dL Final   Creatinine, Ser 07/09/2022 1.23  0.61 - 1.24 mg/dL Final   Calcium 07/09/2022 8.4 (L)  8.9 - 10.3 mg/dL Final   GFR, Estimated 07/09/2022 >60  >60 mL/min Final   Comment: (NOTE) Calculated using the CKD-EPI Creatinine Equation (2021)    Anion gap 07/09/2022 11  5 - 15 Final   Performed at [facility], [place] [address] [place], [place] [address]  Hospital Outpatient Visit on 07/01/2022  Component Date Value Ref Range Status   MRSA, PCR 07/01/2022 NEGATIVE  NEGATIVE Final   Staphylococcus aureus 07/01/2022 NEGATIVE  NEGATIVE Final   Comment: (NOTE) The Xpert SA Assay (FDA approved for NASAL specimens in patients 77 years of age and older),  is one component of a comprehensive surveillance program. It is not intended to diagnose infection nor to guide or monitor treatment. Performed at [facility], [place] [address] [place], [place] [address]    Sodium 07/01/2022 140  135 - 145 mmol/L Final   Potassium 07/01/2022 5.3 (H)  3.5 - 5.1 mmol/L Final   Chloride 07/01/2022 103  98 - 111 mmol/L Final   CO2 07/01/2022 28  22 - 32 mmol/L Final   Glucose, Bld 07/01/2022 100 (H)  70 - 99 mg/dL Final   Glucose reference range applies only to samples taken after fasting for at least 8 hours.   BUN 07/01/2022 26 (H)  8 - 23 mg/dL Final   Creatinine, Ser 07/01/2022 1.38 (H)  0.61 - 1.24 mg/dL Final   Calcium 07/01/2022 9.3  8.9 - 10.3 mg/dL  Final   GFR, Estimated 07/01/2022 52 (L)  >60 mL/min Final   Comment: (NOTE) Calculated using the CKD-EPI Creatinine Equation (2021)    Anion gap 07/01/2022 9  5 - 15 Final   Performed at [facility], [place] [address] [place], [place] [address]   WBC 07/01/2022 9.5  4.0 - 10.5 K/uL Final   RBC 07/01/2022 5.23  4.22 - 5.81 MIL/uL Final   Hemoglobin 07/01/2022 16.3  13.0 - 17.0 g/dL Final   HCT 07/01/2022 49.6  39.0 - 52.0 % Final   MCV 07/01/2022 94.8  80.0 - 100.0 fL Final   MCH 07/01/2022 31.2  26.0 - 34.0 pg Final   MCHC 07/01/2022 32.9  30.0 - 36.0 g/dL Final   RDW 07/01/2022 13.0  11.5 - 15.5 % Final   Platelets 07/01/2022 218  150 - 400 K/uL Final   nRBC 07/01/2022 0.0  0.0 - 0.2 % Final   Performed at [facility], [place] [address], [place], [place] [address]     X-Rays:US RENAL  Result Date: 07/12/2022 CLINICAL DATA:  Vomiting, constipation EXAM: RENAL / URINARY TRACT ULTRASOUND COMPLETE COMPARISON:  CT abdomen/pelvis dated 05/28/2011 FINDINGS: Right Kidney: Renal measurements: 10.0 x 4.9 x 4.9 cm = volume: 125 mL. Echogenicity within normal limits. Mild right hydronephrosis. Left Kidney: Renal measurements: 11.2 x 6.2 x 5.2 cm = volume: 188 mL. Echogenicity within normal limits. Fullness of the left renal collecting system without frank hydronephrosis. Bladder: Decompressed by an indwelling Foley catheter Other: None. IMPRESSION: Mild right hydronephrosis. Fullness of the left renal collecting system without frank hydronephrosis. While nonspecific, these findings raise the possibility of chronic bladder outlet obstruction. Bladder decompressed by an indwelling Foley catheter. Electronically Signed   By: Julian Hy M.D.   On: 07/12/2022 00:29   Abd 1 View (KUB)  Result Date: 07/12/2022 CLINICAL DATA:  Vomiting for 48 hours EXAM: ABDOMEN - 1 VIEW COMPARISON:  CT abdomen and pelvis 05/28/2011 FINDINGS: The bowel gas pattern is normal. Moderate  stool load greatest in the right colon. No radio-opaque calculi or other significant radiographic abnormality are seen. Left THA. Degenerative changes pubic symphysis, right hip, SI joints, lumbar spine. IMPRESSION: Nonobstructive bowel-gas pattern. Moderate colonic stool load. Correlate for constipation. Electronically Signed   By: Placido Sou M.D.   On: 07/12/2022 00:19    EKG: Orders placed or performed during the hospital encounter of 07/01/22   EKG 12 lead per protocol   EKG 12 lead per protocol     Hospital Course: Jake Collins is a 77 y.o. who was admitted to [facility]. They were brought to the  operating room on 07/08/2022 and underwent Procedure(s): TOTAL KNEE ARTHROPLASTY.  Patient tolerated the procedure well and was later transferred to the recovery room and then to the orthopaedic floor for postoperative care. They were given PO and IV analgesics for pain control following their surgery. They were given 24 hours of postoperative antibiotics of  Anti-infectives (From admission, onward)    Start     Dose/Rate Route Frequency Ordered Stop   07/08/22 1700  ceFAZolin (ANCEF) IVPB 2g/100 mL premix        2 g 200 mL/hr over 30 Minutes Intravenous Every 6 hours 07/08/22 1537 07/09/22 0155   07/08/22 0830  ceFAZolin (ANCEF) IVPB 2g/100 mL premix        2 g 200 mL/hr over 30 Minutes Intravenous On call to O.R. 07/08/22 0923 07/08/22 1116      and started on DVT prophylaxis in the form of Aspirin.   PT and OT were ordered for total joint protocol. Discharge planning consulted to help with postop disposition and equipment needs.  Patient had an uneventful night on the evening of surgery. They started to get up OOB with therapy on POD 0. Pt was seen during rounds and was ready to go home pending progress with therapy. He worked with therapy on POD #1 and was meeting his goals. Pt was discharged to home later that day in stable condition.  Diet: Regular diet Activity:  WBAT Follow-up: in 2 weeks Disposition: Home Discharged Condition: good   Discharge Instructions     Call MD / Call 911   Complete by: As directed    If you experience chest pain or shortness of breath, CALL 911 and be transported to the hospital emergency room.  If you develope a fever above 101 F, pus (white drainage) or increased drainage or redness at the wound, or calf pain, call your surgeon's office.   Change dressing   Complete by: As directed    You may remove the bulky bandage (ACE wrap and gauze) two days after surgery. You will have an adhesive waterproof bandage underneath. Leave this in place until your first follow-up appointment.   Constipation Prevention   Complete by: As directed    Drink plenty of fluids.  Prune juice may be helpful.  You may use a stool softener, such as Colace (over the counter) 100 mg twice a day.  Use MiraLax (over the counter) for constipation as needed.   Diet - low sodium heart healthy   Complete by: As directed    Do not put a pillow under the knee. Place it under the heel.   Complete by: As directed    Driving restrictions   Complete by: As directed    No driving for two weeks   Post-operative opioid taper instructions:   Complete by: As directed    POST-OPERATIVE OPIOID TAPER INSTRUCTIONS: It is important to wean off of your opioid medication as soon as possible. If you do not need pain medication after your surgery it is ok to stop day one. Opioids include: Codeine, Hydrocodone(Norco, Vicodin), Oxycodone(Percocet, oxycontin) and hydromorphone amongst others.  Long term and even short term use of opiods can cause: Increased pain response Dependence Constipation Depression Respiratory depression And more.  Withdrawal symptoms can include Flu like symptoms Nausea, vomiting And more Techniques to manage these symptoms Hydrate well Eat regular healthy meals Stay active Use relaxation techniques(deep breathing, meditating,  yoga) Do Not substitute Alcohol to help with tapering If you have been on opioids  for less than two weeks and do not have pain than it is ok to stop all together.  Plan to wean off of opioids This plan should start within one week post op of your joint replacement. Maintain the same interval or time between taking each dose and first decrease the dose.  Cut the total daily intake of opioids by one tablet each day Next start to increase the time between doses. The last dose that should be eliminated is the evening dose.      TED hose   Complete by: As directed    Use stockings (TED hose) for three weeks on both leg(s).  You may remove them at night for sleeping.   Weight bearing as tolerated   Complete by: As directed       Allergies as of 07/09/2022       Reactions   Sulfa Antibiotics    Katherina Right syndrome         Medication List     TAKE these medications    acetaminophen 500 MG tablet Commonly known as: TYLENOL Take 2 tablets (1,000 mg total) by mouth every 8 (eight) hours as needed.   aspirin EC 325 MG tablet Take 1 tablet (325 mg total) by mouth 2 (two) times daily for 21 days.   B-12 5000 MCG Caps Take 5,000 mcg by mouth daily.   cholecalciferol 25 MCG (1000 UNIT) tablet Commonly known as: VITAMIN D3 Take 1,000 Units by mouth daily.   EPINEPHrine 0.3 mg/0.3 mL Soaj injection Commonly known as: EPI-PEN Inject 0.3 mg into the muscle as needed for anaphylaxis.   famotidine 20 MG tablet Commonly known as: PEPCID Take 20 mg by mouth daily.   glucosamine-chondroitin 500-400 MG tablet Take 1 tablet by mouth daily.   hydrochlorothiazide 12.5 MG capsule Commonly known as: MICROZIDE Take 12.5 mg by mouth daily.   latanoprost 0.005 % ophthalmic solution Commonly known as: XALATAN Place 1 drop into both eyes at bedtime.   levothyroxine 100 MCG tablet Commonly known as: SYNTHROID Take 100 mcg by mouth daily.   lithium carbonate 150 MG capsule Take 1  capsule (150 mg total) by mouth at bedtime.   loratadine 10 MG tablet Commonly known as: CLARITIN Take 10 mg by mouth daily as needed for allergies.   Magnesium 250 MG Tabs Take 250 mg by mouth daily.   Melatonin 5 MG Chew Chew 10 mg by mouth at bedtime. Gummy tabs   nebivolol 5 MG tablet Commonly known as: BYSTOLIC Take 5 mg by mouth daily.   Omega 3 1200 MG Caps Take 1,200 mg by mouth daily.   OVER THE COUNTER MEDICATION Take 2 tablets by mouth daily. Neuriva Plus   pyridOXINE 100 MG tablet Commonly known as: VITAMIN B6 Take 100 mg by mouth daily.   Repatha SureClick 701 MG/ML Soaj Generic drug: Evolocumab Inject 140 mg into the skin every 14 (fourteen) days.   Vilazodone HCl 20 MG Tabs Take 1 tablet (20 mg total) by mouth daily at 12 noon. What changed: additional instructions               Discharge Care Instructions  (From admission, onward)           Start     Ordered   07/09/22 0000  Weight bearing as tolerated        07/09/22 1409   07/09/22 0000  Change dressing       Comments: You may remove the bulky bandage (ACE wrap  and gauze) two days after surgery. You will have an adhesive waterproof bandage underneath. Leave this in place until your first follow-up appointment.   07/09/22 1409            Follow-up Information     Gaynelle Arabian, MD. Go on 07/24/2022.   Specialty: Orthopedic Surgery Why: You are scheduled for a follow up appointment on 07-24-22 at 2:45 pm. Contact information: [address] [place] [address] [phone]                 Signed: Shearon Balo, PA-C Orthopedic Surgery 07/17/2022, 11:23 AM

## 2022-07-17 NOTE — Telephone Encounter (Signed)
Pt's wife LVM @ 2p. She said that pt had to go to the Emergency Room this weekend because he was "talking out of his head".  They have been home since Monday and pt told his wife not to put on his pj bottoms and then later he told her he didn't know why he didn't have on his pj bottoms.  She wants to know what Dr Clovis Pu wants her to do.  She says he can't leave home for 3 wks so pls call her asap.  Next appt 7/11

## 2022-07-18 NOTE — Telephone Encounter (Signed)
Patient has had 2 hospitalizations in the last few weeks - knee surgery and AKI. He has had GI bleed and lots of abnormal labs. Wife said he is sleeping a lot in his chair during the day and wants to go to bed early, but is up around 7 AM. His appetite is good. The only MH meds he is taking are lithium and Viibryd. He does take 10 mg melatonin at night. Wife said they held melatonin and Viibryd at least one night in the hospital. Wife said he is not irritable, is telling her he appreciates her and loves her. He will say he doesn't want something but then later will say he does. She described his confusion mostly during the day, not later in the afternoon/night. I told her that with the 2 hospitalizations, lots of procedures, and abnormal labs that it was a lot to tolerate and that in itself could be causing the confusion. She said she thinks he is improving, but wanted you to be aware. She doesn't want him to decline. He is starting PT on Saturday, will do 2 weeks at home and then go to outpatient. Wife wasn't asking for anything, but I feel reassurance would be helpful.

## 2022-07-20 DIAGNOSIS — Z471 Aftercare following joint replacement surgery: Secondary | ICD-10-CM | POA: Diagnosis not present

## 2022-07-20 DIAGNOSIS — Z9181 History of falling: Secondary | ICD-10-CM | POA: Diagnosis not present

## 2022-07-20 DIAGNOSIS — E039 Hypothyroidism, unspecified: Secondary | ICD-10-CM | POA: Diagnosis not present

## 2022-07-20 DIAGNOSIS — E7800 Pure hypercholesterolemia, unspecified: Secondary | ICD-10-CM | POA: Diagnosis not present

## 2022-07-20 DIAGNOSIS — I452 Bifascicular block: Secondary | ICD-10-CM | POA: Diagnosis not present

## 2022-07-20 DIAGNOSIS — I119 Hypertensive heart disease without heart failure: Secondary | ICD-10-CM | POA: Diagnosis not present

## 2022-07-20 DIAGNOSIS — Z7982 Long term (current) use of aspirin: Secondary | ICD-10-CM | POA: Diagnosis not present

## 2022-07-20 DIAGNOSIS — H409 Unspecified glaucoma: Secondary | ICD-10-CM | POA: Diagnosis not present

## 2022-07-20 DIAGNOSIS — H9319 Tinnitus, unspecified ear: Secondary | ICD-10-CM | POA: Diagnosis not present

## 2022-07-20 DIAGNOSIS — F411 Generalized anxiety disorder: Secondary | ICD-10-CM | POA: Diagnosis not present

## 2022-07-20 DIAGNOSIS — Z96651 Presence of right artificial knee joint: Secondary | ICD-10-CM | POA: Diagnosis not present

## 2022-07-20 DIAGNOSIS — Z96642 Presence of left artificial hip joint: Secondary | ICD-10-CM | POA: Diagnosis not present

## 2022-07-20 DIAGNOSIS — K219 Gastro-esophageal reflux disease without esophagitis: Secondary | ICD-10-CM | POA: Diagnosis not present

## 2022-07-20 DIAGNOSIS — H538 Other visual disturbances: Secondary | ICD-10-CM | POA: Diagnosis not present

## 2022-07-20 DIAGNOSIS — I77819 Aortic ectasia, unspecified site: Secondary | ICD-10-CM | POA: Diagnosis not present

## 2022-07-20 DIAGNOSIS — F32A Depression, unspecified: Secondary | ICD-10-CM | POA: Diagnosis not present

## 2022-07-20 DIAGNOSIS — M1712 Unilateral primary osteoarthritis, left knee: Secondary | ICD-10-CM | POA: Diagnosis not present

## 2022-07-20 DIAGNOSIS — M7711 Lateral epicondylitis, right elbow: Secondary | ICD-10-CM | POA: Diagnosis not present

## 2022-07-20 DIAGNOSIS — Z981 Arthrodesis status: Secondary | ICD-10-CM | POA: Diagnosis not present

## 2022-07-23 NOTE — Telephone Encounter (Signed)
Patient's wife called into office today regarding [name] and the issues he is having. She would like a rtc to discuss this ASAP. Ph: [phone]. She mentioned seeing if CC could see him but he doesn't have anything open he is on [facility].

## 2022-07-23 NOTE — Telephone Encounter (Signed)
LVM. Discussed this with patient last week and sent Dr. Clovis Pu a message. Have not had a response yet.

## 2022-07-26 DIAGNOSIS — E871 Hypo-osmolality and hyponatremia: Secondary | ICD-10-CM | POA: Diagnosis not present

## 2022-07-26 DIAGNOSIS — R350 Frequency of micturition: Secondary | ICD-10-CM | POA: Diagnosis not present

## 2022-07-27 DIAGNOSIS — H409 Unspecified glaucoma: Secondary | ICD-10-CM | POA: Diagnosis not present

## 2022-07-27 DIAGNOSIS — Z96651 Presence of right artificial knee joint: Secondary | ICD-10-CM | POA: Diagnosis not present

## 2022-07-27 DIAGNOSIS — Z981 Arthrodesis status: Secondary | ICD-10-CM | POA: Diagnosis not present

## 2022-07-27 DIAGNOSIS — Z96642 Presence of left artificial hip joint: Secondary | ICD-10-CM | POA: Diagnosis not present

## 2022-07-27 DIAGNOSIS — K219 Gastro-esophageal reflux disease without esophagitis: Secondary | ICD-10-CM | POA: Diagnosis not present

## 2022-07-27 DIAGNOSIS — Z9181 History of falling: Secondary | ICD-10-CM | POA: Diagnosis not present

## 2022-07-27 DIAGNOSIS — M7711 Lateral epicondylitis, right elbow: Secondary | ICD-10-CM | POA: Diagnosis not present

## 2022-07-27 DIAGNOSIS — E7800 Pure hypercholesterolemia, unspecified: Secondary | ICD-10-CM | POA: Diagnosis not present

## 2022-07-27 DIAGNOSIS — Z471 Aftercare following joint replacement surgery: Secondary | ICD-10-CM | POA: Diagnosis not present

## 2022-07-27 DIAGNOSIS — Z7982 Long term (current) use of aspirin: Secondary | ICD-10-CM | POA: Diagnosis not present

## 2022-07-27 DIAGNOSIS — I119 Hypertensive heart disease without heart failure: Secondary | ICD-10-CM | POA: Diagnosis not present

## 2022-07-27 DIAGNOSIS — F411 Generalized anxiety disorder: Secondary | ICD-10-CM | POA: Diagnosis not present

## 2022-07-27 DIAGNOSIS — H538 Other visual disturbances: Secondary | ICD-10-CM | POA: Diagnosis not present

## 2022-07-27 DIAGNOSIS — H9319 Tinnitus, unspecified ear: Secondary | ICD-10-CM | POA: Diagnosis not present

## 2022-07-27 DIAGNOSIS — I452 Bifascicular block: Secondary | ICD-10-CM | POA: Diagnosis not present

## 2022-07-27 DIAGNOSIS — F32A Depression, unspecified: Secondary | ICD-10-CM | POA: Diagnosis not present

## 2022-07-27 DIAGNOSIS — M1712 Unilateral primary osteoarthritis, left knee: Secondary | ICD-10-CM | POA: Diagnosis not present

## 2022-07-27 DIAGNOSIS — I77819 Aortic ectasia, unspecified site: Secondary | ICD-10-CM | POA: Diagnosis not present

## 2022-07-27 DIAGNOSIS — E039 Hypothyroidism, unspecified: Secondary | ICD-10-CM | POA: Diagnosis not present

## 2022-07-30 DIAGNOSIS — R350 Frequency of micturition: Secondary | ICD-10-CM | POA: Diagnosis not present

## 2022-07-31 DIAGNOSIS — M25661 Stiffness of right knee, not elsewhere classified: Secondary | ICD-10-CM | POA: Diagnosis not present

## 2022-07-31 DIAGNOSIS — M25561 Pain in right knee: Secondary | ICD-10-CM | POA: Diagnosis not present

## 2022-07-31 NOTE — Telephone Encounter (Signed)
Called patient to check on him and he said he was fine. He thought the issues was due to all the medications he had been on for his knee surgery. He denied having any MH concerns at this time.

## 2022-08-02 DIAGNOSIS — M25661 Stiffness of right knee, not elsewhere classified: Secondary | ICD-10-CM | POA: Diagnosis not present

## 2022-08-02 DIAGNOSIS — M25561 Pain in right knee: Secondary | ICD-10-CM | POA: Diagnosis not present

## 2022-08-05 DIAGNOSIS — M25561 Pain in right knee: Secondary | ICD-10-CM | POA: Diagnosis not present

## 2022-08-05 DIAGNOSIS — M25661 Stiffness of right knee, not elsewhere classified: Secondary | ICD-10-CM | POA: Diagnosis not present

## 2022-08-07 DIAGNOSIS — M25661 Stiffness of right knee, not elsewhere classified: Secondary | ICD-10-CM | POA: Diagnosis not present

## 2022-08-07 DIAGNOSIS — M25561 Pain in right knee: Secondary | ICD-10-CM | POA: Diagnosis not present

## 2022-08-09 ENCOUNTER — Ambulatory Visit
Admission: RE | Admit: 2022-08-09 | Discharge: 2022-08-09 | Disposition: A | Discharge status: Home / Self Care | Payer: PPO | Source: Ambulatory Visit | Attending: Gastroenterology | Admitting: Gastroenterology

## 2022-08-09 ENCOUNTER — Other Ambulatory Visit: Payer: Self-pay | Admitting: Gastroenterology

## 2022-08-09 DIAGNOSIS — D649 Anemia, unspecified: Secondary | ICD-10-CM | POA: Diagnosis not present

## 2022-08-09 DIAGNOSIS — R051 Acute cough: Secondary | ICD-10-CM | POA: Diagnosis not present

## 2022-08-09 DIAGNOSIS — K219 Gastro-esophageal reflux disease without esophagitis: Secondary | ICD-10-CM | POA: Diagnosis not present

## 2022-08-09 DIAGNOSIS — M25661 Stiffness of right knee, not elsewhere classified: Secondary | ICD-10-CM | POA: Diagnosis not present

## 2022-08-09 DIAGNOSIS — K2211 Ulcer of esophagus with bleeding: Secondary | ICD-10-CM | POA: Diagnosis not present

## 2022-08-09 DIAGNOSIS — M25561 Pain in right knee: Secondary | ICD-10-CM | POA: Diagnosis not present

## 2022-08-09 DIAGNOSIS — R059 Cough, unspecified: Secondary | ICD-10-CM | POA: Diagnosis not present

## 2022-08-12 DIAGNOSIS — M25661 Stiffness of right knee, not elsewhere classified: Secondary | ICD-10-CM | POA: Diagnosis not present

## 2022-08-12 DIAGNOSIS — M25561 Pain in right knee: Secondary | ICD-10-CM | POA: Diagnosis not present

## 2022-08-13 DIAGNOSIS — R338 Other retention of urine: Secondary | ICD-10-CM | POA: Diagnosis not present

## 2022-08-13 DIAGNOSIS — N401 Enlarged prostate with lower urinary tract symptoms: Secondary | ICD-10-CM | POA: Diagnosis not present

## 2022-08-14 DIAGNOSIS — M25561 Pain in right knee: Secondary | ICD-10-CM | POA: Diagnosis not present

## 2022-08-14 DIAGNOSIS — H6123 Impacted cerumen, bilateral: Secondary | ICD-10-CM | POA: Diagnosis not present

## 2022-08-14 DIAGNOSIS — M25661 Stiffness of right knee, not elsewhere classified: Secondary | ICD-10-CM | POA: Diagnosis not present

## 2022-08-14 DIAGNOSIS — R413 Other amnesia: Secondary | ICD-10-CM | POA: Diagnosis not present

## 2022-08-14 DIAGNOSIS — J069 Acute upper respiratory infection, unspecified: Secondary | ICD-10-CM | POA: Diagnosis not present

## 2022-08-15 ENCOUNTER — Telehealth: Payer: Self-pay | Admitting: Psychiatry

## 2022-08-15 NOTE — Telephone Encounter (Signed)
Put him on cancellation list.

## 2022-08-15 NOTE — Telephone Encounter (Signed)
Wife reports that husband is in bad shape. Any time he deals with stress gets this way. Feels it is his mental condition that is causing problems, not dementia. He doesn't know the grandkids names and can't remember things. She says in his mind " he is fine". She will provide POA info that she has for him . Please call once that is established. She is willing to get him in here to be seen. Can he get some assistance?

## 2022-08-16 ENCOUNTER — Telehealth: Payer: Self-pay | Admitting: Psychiatry

## 2022-08-16 DIAGNOSIS — M25561 Pain in right knee: Secondary | ICD-10-CM | POA: Diagnosis not present

## 2022-08-16 DIAGNOSIS — M25661 Stiffness of right knee, not elsewhere classified: Secondary | ICD-10-CM | POA: Diagnosis not present

## 2022-08-16 NOTE — Telephone Encounter (Signed)
Gave pt's wife appt for 08/20/22

## 2022-08-16 NOTE — Telephone Encounter (Signed)
Received POA via fax 08/16/22. Will scan in chart

## 2022-08-16 NOTE — Telephone Encounter (Signed)
He is on the list.

## 2022-08-20 ENCOUNTER — Ambulatory Visit (INDEPENDENT_AMBULATORY_CARE_PROVIDER_SITE_OTHER): Payer: PPO | Admitting: Psychiatry

## 2022-08-20 DIAGNOSIS — F411 Generalized anxiety disorder: Secondary | ICD-10-CM

## 2022-08-20 DIAGNOSIS — M25561 Pain in right knee: Secondary | ICD-10-CM | POA: Diagnosis not present

## 2022-08-20 DIAGNOSIS — R4182 Altered mental status, unspecified: Secondary | ICD-10-CM | POA: Diagnosis not present

## 2022-08-20 DIAGNOSIS — F331 Major depressive disorder, recurrent, moderate: Secondary | ICD-10-CM

## 2022-08-20 DIAGNOSIS — M25661 Stiffness of right knee, not elsewhere classified: Secondary | ICD-10-CM | POA: Diagnosis not present

## 2022-08-20 NOTE — Patient Instructions (Addendum)
B Complex 1 daily (NAC) N-Acetylcysteine 2 of the  600 mg capsules daily to help with mild cognitive problems.

## 2022-08-20 NOTE — Progress Notes (Signed)
Jake Jake Collins [id number] 10/30/1945 77 y.o.  Subjective:   Patient ID:  Jake Jake Collins is a 77 y.o. (DOB August 10, 1945) male.  Chief Complaint:  Chief Complaint  Patient presents with  . Follow-up  . Depression  . Altered Mental Status     Depression        Past medical history includes anxiety.   Anxiety Patient reports no chest pain or palpitations.    Jake Jake Collins presents to the office today for follow-up of major depression and GAD.  seen June 2020 and was doing well and no meds were changed.  The only psychiatric med for the last few years has been venlafaxine 50 mg daily.  12/07/2019 appointment with the following noted: Remained well.   Concerned about venlafaxine sexual SE.  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 Neither he nor wife notced change in mood anxiety since that time.   Has plenty of money and no longer interested in the money.   Wife is concerned he's moody.   Gym 3 times weekly. Plan: Cont med long term DT high relapse risk.  Has a long history of depression and anxiety.  Has had multiple relapses before.  Been under my care since 2006.  He's reduced venlafaxine to 25 mg daily and still complains of sexual SE but wife complains he's moody. Option lamotrigine, lithium alone, Viibryd. Lithium 150 mg daily for mood and wean off venlafaxine  By 12.5 mg daily for 3 weeks and stop it DT sexual SE.  02/10/20 appt with the following noted: No problems off venlafaxine.  No depression.  Sexual SE a little better.  Moodiness is better. Started melatonin and helped sleep.  No SE lithium. Not more anxious.    02/09/21 appt with following noted:  No depression.   Patient denies any recent difficulty with anxiety.  Patient denies difficulty with sleep initiation or maintenance. Denies appetite disturbance.  Patient reports that energy and motivation have been good.  Patient denies any difficulty with  concentration.  Patient denies any suicidal ideation. Plan: Lithium 150 mg daily for mood and no problems off of the venlafaxine..   10/10/21 appt noted: Wife not happy with me.  She's had 4th and 5th back surgery.  Does all kinds of things for him.  She says he's moodier than normal.  Money is not a problem. More short tempered than he used to be.  Can't play golf bc of knees.  Will need TKR in the next few mos.  Still goes to gym 3 times weekly.  Would like to get back to golf bc no hobbies. PLAN: To avoid sexual SE will pick Trintellix or Viibryd.   Viibryd 10 mg daily. Lithium 150 mg daily for mood and no problems off of the venlafaxine..  11/29/21 appt noted: No effect Viibryd good or bad.  Takes with breakfast. No SE Would like more benefit.   Sleep good.  Lower appetite but wt ok. Plan: Increase Viibryd to  20 mg daily for depression. Lithium 150 mg daily for mood   02/14/22 appt noted: Some additional benefit mood is better and wife agrees. Never been a big talker but is talking more with her.  Married 59 years.  Doesn't feel depressed.  Less irritable with it.   Going on vacation with family.  No GI px with meds. Taking Pepcid.  Viibryd 20 and lithium 150 mg daily. Sexual SE ED.  Can put up with it if it helps. Plan: Better with Viibryd to  20 mg daily for depression. Lithium 150 mg daily for mood   06/20/22 appt noted: Got confused about a change in generic.   Mood is good and pleased with response to Viibryd. Disc low dose lithium. Doing well doc. Plan no med changes  07/17/22 TC from wife reporting H with TKR and then complications and hosp and has been more confused.  Last hosp 07/11/22.   08/15/22 TC:  Wife reports that husband is in bad shape. Any time he deals with stress gets this way. Feels it is his mental condition that is causing problems, not dementia. He doesn't know the grandkids names and can't remember things. She says in his mind " he is fine".    Was put on  cancellation list.  08/20/22 appt noted:  seen urgently per wife, Jake Jake Collins, and with her Still on viibryd 20 & lithium 150 daily. I have been a pain in the Butt to live with.  Mainly forgetful since surgery.  Needs repetition.  Trouble with knee.  Doesn't feel Jake Collins yet.  Only Tylenol for pain.  Times of feeling confused.  Not really depressed but maybe a little. Good appetite.  Sometimes sleeps too much.   W sees confusion, GI bleeding and U incontinence since surgery.   W says after surgery 07/08/22 had not been given his usual meds and he was having panic.  Couple days later UGI bleed and hosp and was confused in hospital.   Per wife this is 4th time in 60 years she's seen a change.  Less involved and less watching golf.  PT noticed he was out of it and was more confused not knowing gkids names. Wife says sleeping 12-15 hours and then some in day.  Was worried about this appt and got dressed at 5 and kept asking if it was time to go to the hospital.   Had lost 15 # thinks he gained some back.   TSH checked about 3 mos ago.   Past Psychiatric Medication Trials:  Lithium, Lexapro, Zoloft, Effexor XR 150 sexual SE, Klonopin   M history psych hosp and ECT.  F neglected him.  Review of Systems:  Review of Systems  Cardiovascular:  Negative for chest pain and palpitations.  Gastrointestinal:  Positive for constipation. Negative for diarrhea.  Genitourinary:        ED  Musculoskeletal:  Positive for arthralgias and gait problem.  Neurological:  Negative for tremors.  Psychiatric/Behavioral:  Negative for dysphoric mood.     Medications: I have reviewed the patient's current medications.  Current Outpatient Medications  Medication Sig Dispense Refill  . acetaminophen (TYLENOL) 500 MG tablet Take 2 tablets (1,000 mg total) by mouth every 8 (eight) hours as needed. (Patient taking differently: Take 1,000 mg by mouth every 8 (eight) hours as needed for moderate pain.) 30 tablet 0  .  cholecalciferol (VITAMIN D3) 25 MCG (1000 UT) tablet Take 1,000 Units by mouth daily.    . Cyanocobalamin (B-12) 5000 MCG CAPS Take 5,000 mcg by mouth daily.    Marland Kitchen EPINEPHrine 0.3 mg/0.3 mL IJ SOAJ injection Inject 0.3 mg into the muscle as needed for anaphylaxis.    . famotidine (PEPCID) 20 MG tablet Take 20 mg by mouth daily.    Marland Kitchen glucosamine-chondroitin 500-400 MG tablet Take 1 tablet by mouth daily.    . hydrochlorothiazide (MICROZIDE) 12.5 MG capsule Take 12.5 mg by mouth daily.    Marland Kitchen  latanoprost (XALATAN) 0.005 % ophthalmic solution Place 1 drop into both eyes at bedtime.    Marland Kitchen levothyroxine (SYNTHROID, LEVOTHROID) 100 MCG tablet Take 100 mcg by mouth daily.      Marland Kitchen lithium carbonate 150 MG capsule Take 1 capsule (150 mg total) by mouth at bedtime. 90 capsule 1  . loratadine (CLARITIN) 10 MG tablet Take 10 mg by mouth daily as needed for allergies.    . Magnesium 250 MG TABS Take 250 mg by mouth daily.    . Melatonin 5 MG CHEW Chew 10 mg by mouth at bedtime. Gummy tabs    . nebivolol (BYSTOLIC) 5 MG tablet Take 5 mg by mouth daily.    . Omega 3 1200 MG CAPS Take 1,200 mg by mouth daily.    Marland Kitchen OVER THE COUNTER MEDICATION Take 2 tablets by mouth daily. Neuriva Plus    . pantoprazole (PROTONIX) 40 MG tablet Take 1 tablet (40 mg total) by mouth 2 (two) times daily for 30 days, THEN 1 tablet (40 mg total) daily. 90 tablet 0  . pyridOXINE (VITAMIN B-6) 100 MG tablet Take 100 mg by mouth daily.    Marland Kitchen REPATHA SURECLICK [id number] MG/ML SOAJ Inject 140 mg into the skin every 14 (fourteen) days.    . silodosin (RAPAFLO) 8 MG CAPS capsule Take 1 capsule (8 mg total) by mouth daily with breakfast. 30 capsule 2  . Vilazodone HCl 20 MG TABS Take 1 tablet (20 mg total) by mouth daily at 12 noon. (Patient taking differently: Take 20 mg by mouth daily at 12 noon. Pt takes at 8am) 90 tablet 1   No current facility-administered medications for this visit.    Medication Side Effects: sexual SE  Allergies:  Allergies   Allergen Reactions  . Sulfa Antibiotics     Jake Jake Collins syndrome     Past Medical History:  Diagnosis Date  . Arthritis   . Cancer ([place])    skin melanoma  . Depression   . Diverticulitis    2 bouts in 10 years  . Hyperlipemia   . Hypertension   . Hypothyroid   . Scoliosis   . Thyroid disease     Family History  Problem Relation Age of Onset  . Heart disease Father     Social History   Socioeconomic History  . Marital status: Married    Spouse name: Not on file  . Number of children: Not on file  . Years of education: Not on file  . Highest education level: Not on file  Occupational History  . Not on file  Tobacco Use  . Smoking status: Never  . Smokeless tobacco: Never  Vaping Use  . Vaping Use: Never used  Substance and Sexual Activity  . Alcohol use: No  . Drug use: No  . Sexual activity: Not Currently  Other Topics Concern  . Not on file  Social History Narrative  . Not on file   Social Determinants of Health   Financial Resource Strain: Not on file  Food Insecurity: No Food Insecurity (07/12/2022)   Hunger Vital Sign   . Worried About Charity fundraiser in the Last Year: Never true   . Ran Out of Food in the Last Year: Never true  Transportation Needs: No Transportation Needs (07/12/2022)   PRAPARE - Transportation   . Lack of Transportation (Medical): No   . Lack of Transportation (Non-Medical): No  Physical Activity: Not on file  Stress: Not on file  Social Connections:  Not on file  Intimate Partner Violence: Not At Risk (07/12/2022)   Humiliation, Afraid, Rape, and Kick questionnaire   . Fear of Current or Ex-Partner: No   . Emotionally Abused: No   . Physically Abused: No   . Sexually Abused: No    Past Medical History, Surgical history, Social history, and Family history were reviewed and updated as appropriate.   Please see review of systems for further details on the patient's review from today.   Objective:   Physical Exam:   There were no vitals taken for this visit.  Physical Exam Constitutional:      General: He is not in acute distress.    Appearance: He is well-developed.  Musculoskeletal:        General: No deformity.  Neurological:     Mental Status: He is alert and oriented to person, place, and time.     Coordination: Coordination normal.  Psychiatric:        Attention and Perception: Attention and perception normal. He does not perceive auditory or visual hallucinations.        Mood and Affect: Mood is depressed. Mood is not anxious. Affect is not labile, blunt, tearful or inappropriate.        Speech: Speech normal.        Behavior: Behavior normal.        Thought Content: Thought content normal. Thought content is not delusional. Thought content does not include homicidal or suicidal ideation. Thought content does not include suicidal plan.        Judgment: Judgment normal.     Comments: Insight intact. No delusions.  More irritable. 2095, April, Friday, Spring.  [place], [place] [address] 65.   WORLD.  CANNOT GO BACKWARDS. Immediate 3/3, recall 2/3.   Wants Trump but he's not president.      Lab Review:     Component Value Date/Time   NA 139 07/13/2022 0522   K 3.1 (L) 07/13/2022 0522   CL 110 07/13/2022 0522   CO2 24 07/13/2022 0522   GLUCOSE 102 (H) 07/13/2022 0522   BUN 28 (H) 07/13/2022 0522   CREATININE 1.23 07/13/2022 0522   CALCIUM 8.1 (L) 07/13/2022 0522   PROT 6.0 (L) 07/11/2022 2142   ALBUMIN 2.8 (L) 07/11/2022 2142   AST 28 07/11/2022 2142   ALT 13 07/11/2022 2142   ALKPHOS 44 07/11/2022 2142   BILITOT 0.9 07/11/2022 2142   GFRNONAA >60 07/13/2022 0522   GFRAA >60 04/15/2019 0308       Component Value Date/Time   WBC 8.9 07/13/2022 0522   RBC 2.69 (L) 07/13/2022 0522   HGB 8.5 (L) 07/13/2022 0522   HCT 26.2 (L) 07/13/2022 0522   PLT 171 07/13/2022 0522   MCV 97.4 07/13/2022 0522   MCH 31.6 07/13/2022 0522   MCHC 32.4 07/13/2022 0522   RDW 13.4 07/13/2022 0522    LYMPHSABS 1.2 07/13/2022 0522   MONOABS 1.0 07/13/2022 0522   EOSABS 0.2 07/13/2022 0522   BASOSABS 0.0 07/13/2022 0522    Lithium Lvl  Date Value Ref Range Status  07/13/2022 <0.06 (L) 0.60 - 1.20 mmol/L Final    Comment:    Performed at [facility], [place] [address], [place], [place] [address]   07/13/22 lithium level not detectable on 150 mg HS.  ? Compliance.  No results found for: "PHENYTOIN", "PHENOBARB", "VALPROATE", "CBMZ"   .res Assessment: Plan:    Dimitrious was seen today for follow-up, depression and altered mental status.  Diagnoses and all orders for this visit:  Major depressive disorder, recurrent episode, moderate (HCC)  Generalized anxiety disorder  Altered mental status, unspecified altered mental status type   Cont med long term DT high relapse risk.  Has a long history of depression and anxiety.  Has had multiple relapses before.  Been under my care since 2006.   To avoid sexual SE consider Auvelity but cost might be a problem  Continue Viibryd to  20 mg daily for depression. Lithium 150 mg daily for mood  No SE  Get copy of labs, TSH and B12 eval  For cognitive problems  start B complex and NAC 600 mg daily  Don't drive at this time for cognitive reasons.  Follow-up 2 weeks  Stephanie Acre, MD, DFAPA  Please see After Visit Summary for patient specific instructions.  Future Appointments  Date Time Provider [place]  12/19/2022  4:30 PM Cottle, Billey Co., MD [facility] None      No orders of the defined types were placed in this encounter.   -------------------------------

## 2022-08-21 ENCOUNTER — Encounter: Payer: Self-pay | Admitting: Psychiatry

## 2022-08-21 DIAGNOSIS — Z471 Aftercare following joint replacement surgery: Secondary | ICD-10-CM | POA: Diagnosis not present

## 2022-08-27 DIAGNOSIS — N401 Enlarged prostate with lower urinary tract symptoms: Secondary | ICD-10-CM | POA: Diagnosis not present

## 2022-08-27 DIAGNOSIS — R338 Other retention of urine: Secondary | ICD-10-CM | POA: Diagnosis not present

## 2022-08-30 ENCOUNTER — Ambulatory Visit: Payer: PPO | Admitting: Psychiatry

## 2022-08-30 ENCOUNTER — Encounter: Payer: Self-pay | Admitting: Psychiatry

## 2022-08-30 ENCOUNTER — Ambulatory Visit (INDEPENDENT_AMBULATORY_CARE_PROVIDER_SITE_OTHER): Payer: PPO | Admitting: Psychiatry

## 2022-08-30 DIAGNOSIS — F331 Major depressive disorder, recurrent, moderate: Secondary | ICD-10-CM

## 2022-08-30 DIAGNOSIS — F411 Generalized anxiety disorder: Secondary | ICD-10-CM

## 2022-08-30 DIAGNOSIS — R4182 Altered mental status, unspecified: Secondary | ICD-10-CM | POA: Diagnosis not present

## 2022-08-30 NOTE — Patient Instructions (Signed)
B complex 1 daily, NAC 1 daily.

## 2022-08-30 NOTE — Progress Notes (Signed)
REZA GRZESIK [id number] Apr 21, 1946 77 y.o.  Subjective:   Patient ID:  Jake Collins is a 77 y.o. (DOB [birth year]) male.  Chief Complaint:  Chief Complaint  Patient presents with   Follow-up   Altered Mental Status   Depression     Depression        Past medical history includes anxiety.   Anxiety Patient reports no chest pain or palpitations.     Mcihael Bach Collins presents to the office today for follow-up of major depression and GAD.  seen June 2020 and was doing well and no meds were changed.  The only psychiatric med for the last few years has been venlafaxine 50 mg daily.  12/07/2019 appointment with the following noted: Remained well.   Concerned about venlafaxine sexual SE.  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 Neither he nor wife notced change in mood anxiety since that time.   Has plenty of money and no longer interested in the money.   Wife is concerned he's moody.   Gym 3 times weekly. Plan: Cont med long term DT high relapse risk.  Has a long history of depression and anxiety.  Has had multiple relapses before.  Been under my care since 2006.  He's reduced venlafaxine to 25 mg daily and still complains of sexual SE but wife complains he's moody. Option lamotrigine, lithium alone, Viibryd. Lithium 150 mg daily for mood and wean off venlafaxine  By 12.5 mg daily for 3 weeks and stop it DT sexual SE.  02/10/20 appt with the following noted: No problems off venlafaxine.  No depression.  Sexual SE a little better.  Moodiness is better. Started melatonin and helped sleep.  No SE lithium. Not more anxious.    02/09/21 appt with following noted:  No depression.   Patient denies any recent difficulty with anxiety.  Patient denies difficulty with sleep initiation or maintenance. Denies appetite disturbance.  Patient reports that energy and motivation have been good.  Patient denies any difficulty with  concentration.  Patient denies any suicidal ideation. Plan: Lithium 150 mg daily for mood and no problems off of the venlafaxine..   10/10/21 appt noted: Wife not happy with me.  She's had 4th and 5th back surgery.  Does all kinds of things for him.  She says he's moodier than normal.  Money is not a problem. More short tempered than he used to be.  Can't play golf bc of knees.  Will need TKR in the next few mos.  Still goes to gym 3 times weekly.  Would like to get back to golf bc no hobbies. PLAN: To avoid sexual SE will pick Trintellix or Viibryd.   Viibryd 10 mg daily. Lithium 150 mg daily for mood and no problems off of the venlafaxine..  11/29/21 appt noted: No effect Viibryd good or bad.  Takes with breakfast. No SE Would like more benefit.   Sleep good.  Lower appetite but wt ok. Plan: Increase Viibryd to  20 mg daily for depression. Lithium 150 mg daily for mood   02/14/22 appt noted: Some additional benefit mood is better and wife agrees. Never been a big talker but is talking more with her.  Married 59 years.  Doesn't feel depressed.  Less irritable with it.   Going on vacation with family.  No GI px with meds. Taking Pepcid.  Viibryd 20 and lithium 150 mg daily. Sexual SE ED.  Can put up with it if it helps. Plan: Better with Viibryd to  20 mg daily for depression. Lithium 150 mg daily for mood   06/20/22 appt noted: Got confused about a change in generic.   Mood is good and pleased with response to Viibryd. Disc low dose lithium. Doing well doc. Plan no med changes  07/17/22 TC from wife reporting H with TKR and then complications and hosp and has been more confused.  Last hosp 07/11/22.   08/15/22 TC:  Wife reports that husband is in bad shape. Any time he deals with stress gets this way. Feels it is his mental condition that is causing problems, not dementia. He doesn't know the grandkids names and can't remember things. She says in his mind " he is fine".    Was put on  cancellation list.  08/20/22 appt noted:  seen urgently per wife, Jake Collins, and with her Still on viibryd 20 & lithium 150 daily. I have been a pain in the Butt to live with.  Mainly forgetful since surgery.  Needs repetition.  Trouble with knee.  Doesn't feel right yet.  Only Tylenol for pain.  Times of feeling confused.  Not really depressed but maybe a little. Good appetite.  Sometimes sleeps too much.   W sees confusion, GI bleeding and U incontinence since surgery.   W says after surgery 07/08/22 had not been given his usual meds and he was having panic.  Couple days later UGI bleed and hosp and was confused in hospital.   Per wife this is 4th time in 60 years she's seen a change.  Less involved and less watching golf.  PT noticed he was out of it and was more confused not knowing gkids names. Wife says sleeping 12-15 hours and then some in day.  Was worried about this appt and got dressed at 5 and kept asking if it was time to go to the hospital.   Had lost 15 # thinks he gained some back.   TSH checked about 3 mos ago.  08/30/22 appt noted:  seen with wife Got confused about a doctor's appt.  Wife thinks when low BP he gets more confused.  Has stopped HCTZ. She still feels he's not himself.  A little memory problems before surgery but 50% worse now.   Still knee pain. Family notices he's not himself.   PCP Donnie Coffin will retire. [facility]. W says past history of paranoid delusions for mos thinking they were going to freeze to death abut not delusional now.   Sleep ok without napping. Wants to drive car again.  Past Psychiatric Medication Trials:  Lithium, Lexapro, Zoloft, Effexor XR 150 sexual SE, Klonopin   M history psych hosp and ECT.  F neglected him. M and B, uncle,  schizophrenic.  D psych tx.  Review of Systems:  Review of Systems  Cardiovascular:  Negative for chest pain and palpitations.  Gastrointestinal:  Positive for constipation. Negative for diarrhea.   Genitourinary:        ED  Musculoskeletal:  Positive for arthralgias and gait problem.  Neurological:  Negative for tremors.  Psychiatric/Behavioral:  Positive for depression. Negative for dysphoric mood.     Medications: I have reviewed the patient's current medications.  Current Outpatient Medications  Medication Sig Dispense Refill   acetaminophen (TYLENOL) 500 MG tablet Take 2 tablets (1,000 mg total) by mouth every 8 (eight) hours as needed. (Patient taking differently: Take 1,000 mg by mouth every 8 (  eight) hours as needed for moderate pain.) 30 tablet 0   cholecalciferol (VITAMIN D3) 25 MCG (1000 UT) tablet Take 1,000 Units by mouth daily.     Cyanocobalamin (B-12) 5000 MCG CAPS Take 5,000 mcg by mouth daily.     EPINEPHrine 0.3 mg/0.3 mL IJ SOAJ injection Inject 0.3 mg into the muscle as needed for anaphylaxis.     famotidine (PEPCID) 20 MG tablet Take 20 mg by mouth daily.     glucosamine-chondroitin 500-400 MG tablet Take 1 tablet by mouth daily.     hydrochlorothiazide (MICROZIDE) 12.5 MG capsule Take 12.5 mg by mouth daily.     latanoprost (XALATAN) 0.005 % ophthalmic solution Place 1 drop into both eyes at bedtime.     levothyroxine (SYNTHROID, LEVOTHROID) 100 MCG tablet Take 100 mcg by mouth daily.       lithium carbonate 150 MG capsule Take 1 capsule (150 mg total) by mouth at bedtime. 90 capsule 1   loratadine (CLARITIN) 10 MG tablet Take 10 mg by mouth daily as needed for allergies.     Magnesium 250 MG TABS Take 250 mg by mouth daily.     Melatonin 5 MG CHEW Chew 10 mg by mouth at bedtime. Gummy tabs     nebivolol (BYSTOLIC) 5 MG tablet Take 5 mg by mouth daily.     Omega 3 1200 MG CAPS Take 1,200 mg by mouth daily.     OVER THE COUNTER MEDICATION Take 2 tablets by mouth daily. Neuriva Plus     pantoprazole (PROTONIX) 40 MG tablet Take 1 tablet (40 mg total) by mouth 2 (two) times daily for 30 days, THEN 1 tablet (40 mg total) daily. 90 tablet 0   pyridOXINE (VITAMIN  B-6) 100 MG tablet Take 100 mg by mouth daily.     REPATHA SURECLICK [id number] MG/ML SOAJ Inject 140 mg into the skin every 14 (fourteen) days.     silodosin (RAPAFLO) 8 MG CAPS capsule Take 1 capsule (8 mg total) by mouth daily with breakfast. 30 capsule 2   Vilazodone HCl 20 MG TABS Take 1 tablet (20 mg total) by mouth daily at 12 noon. (Patient taking differently: Take 20 mg by mouth daily at 12 noon. Pt takes at 8am) 90 tablet 1   No current facility-administered medications for this visit.    Medication Side Effects: sexual SE  Allergies:  Allergies  Allergen Reactions   Sulfa Antibiotics     Katherina Right syndrome     Past Medical History:  Diagnosis Date   Arthritis    Cancer ([place])    skin melanoma   Depression    Diverticulitis    2 bouts in 10 years   Hyperlipemia    Hypertension    Hypothyroid    Scoliosis    Thyroid disease     Family History  Problem Relation Age of Onset   Heart disease Father     Social History   Socioeconomic History   Marital status: Married    Spouse name: Not on file   Number of children: Not on file   Years of education: Not on file   Highest education level: Not on file  Occupational History   Not on file  Tobacco Use   Smoking status: Never   Smokeless tobacco: Never  Vaping Use   Vaping Use: Never used  Substance and Sexual Activity   Alcohol use: No   Drug use: No   Sexual activity: Not Currently  Other Topics Concern  Not on file  Social History Narrative   Not on file   Social Determinants of Health   Financial Resource Strain: Not on file  Food Insecurity: No Food Insecurity (07/12/2022)   Hunger Vital Sign    Worried About Running Out of Food in the Last Year: Never true    Ran Out of Food in the Last Year: Never true  Transportation Needs: No Transportation Needs (07/12/2022)   PRAPARE - Hydrologist (Medical): No    Lack of Transportation (Non-Medical): No  Physical Activity: Not  on file  Stress: Not on file  Social Connections: Not on file  Intimate Partner Violence: Not At Risk (07/12/2022)   Humiliation, Afraid, Rape, and Kick questionnaire    Fear of Current or Ex-Partner: No    Emotionally Abused: No    Physically Abused: No    Sexually Abused: No    Past Medical History, Surgical history, Social history, and Family history were reviewed and updated as appropriate.   Please see review of systems for further details on the patient's review from today.   Objective:   Physical Exam:  There were no vitals taken for this visit.  Physical Exam Constitutional:      General: He is not in acute distress.    Appearance: He is well-developed.  Musculoskeletal:        General: No deformity.  Neurological:     Mental Status: He is alert and oriented to person, place, and time.     Coordination: Coordination normal.  Psychiatric:        Attention and Perception: Attention and perception normal. He does not perceive auditory or visual hallucinations.        Mood and Affect: Mood is depressed. Mood is not anxious. Affect is not labile, blunt, tearful or inappropriate.        Speech: Speech normal.        Behavior: Behavior normal.        Thought Content: Thought content normal. Thought content is not delusional. Thought content does not include homicidal or suicidal ideation. Thought content does not include suicidal plan.        Cognition and Memory: Cognition is impaired. He exhibits impaired recent memory.        Judgment: Judgment normal.     Comments: Insight intact. No delusions.  2024, March, Friday, Spring.  [place], [place] 93-7=? WORLD.  Cannot go backwards. Immediate 3/3, recall 2/3.   Cannot recall president except not someone he wants.     Lab Review:     Component Value Date/Time   NA 139 07/13/2022 0522   K 3.1 (L) 07/13/2022 0522   CL 110 07/13/2022 0522   CO2 24 07/13/2022 0522   GLUCOSE 102 (H) 07/13/2022 0522   BUN 28 (H) 07/13/2022 0522    CREATININE 1.23 07/13/2022 0522   CALCIUM 8.1 (L) 07/13/2022 0522   PROT 6.0 (L) 07/11/2022 2142   ALBUMIN 2.8 (L) 07/11/2022 2142   AST 28 07/11/2022 2142   ALT 13 07/11/2022 2142   ALKPHOS 44 07/11/2022 2142   BILITOT 0.9 07/11/2022 2142   GFRNONAA >60 07/13/2022 0522   GFRAA >60 04/15/2019 0308       Component Value Date/Time   WBC 8.9 07/13/2022 0522   RBC 2.69 (L) 07/13/2022 0522   HGB 8.5 (L) 07/13/2022 0522   HCT 26.2 (L) 07/13/2022 0522   PLT 171 07/13/2022 0522   MCV 97.4 07/13/2022 0522   MCH  31.6 07/13/2022 0522   MCHC 32.4 07/13/2022 0522   RDW 13.4 07/13/2022 0522   LYMPHSABS 1.2 07/13/2022 0522   MONOABS 1.0 07/13/2022 0522   EOSABS 0.2 07/13/2022 0522   BASOSABS 0.0 07/13/2022 0522    Lithium Lvl  Date Value Ref Range Status  07/13/2022 <0.06 (L) 0.60 - 1.20 mmol/L Final    Comment:    Performed at [facility], [place] [address], [place],  [id number]   07/13/22 lithium level not detectable on 150 mg HS.  ? Compliance.  No results found for: "PHENYTOIN", "PHENOBARB", "VALPROATE", "CBMZ"   .res Assessment: Plan:    Faustin was seen today for follow-up, altered mental status and depression.  Diagnoses and all orders for this visit:  Major depressive disorder, recurrent episode, moderate (HCC)  Altered mental status, unspecified altered mental status type  Generalized anxiety disorder   Greater than 50% of 45 min face to face time with patient was spent on counseling and coordination of care.  Seen with wife. We discussed Cont med long term DT high relapse risk.  Has a long history of depression and anxiety.  Has had multiple relapses before.  Been under my care since 2006.   Was doing fine until TKR with complications with ongoing impaired conc and memory.   ? Pseudodementia.  But no evidence of depression other than cognitive problems.  Likely organic.Marland Kitchen Disc this in detail.  To avoid sexual SE, consider Auvelity but cost  might be a problem  Continue Viibryd to  20 mg daily for depression. Lithium 150 mg daily for mood  No SE   TSH normal in Nov by PCP, Dr. Donnie Coffin.  Spoke with Dr. Virgilio Belling office 08/30/22.  Sent letter to Dr. Alroy Dust No recent  B12 or brain imaging available and rec this be done  For cognitive problems  start B complex and NAC 600 mg daily   Don't drive at this time for cognitive reasons.  He's reluctantly agreeable for now.  Disc this in detail.  Follow-up 2 weeks  Stephanie Acre, MD, DFAPA  Please see After Visit Summary for patient specific instructions.  Future Appointments  Date Time Provider [place]  09/13/2022  2:30 PM Cottle, Billey Co., MD [facility] None  12/19/2022  4:30 PM Cottle, Billey Co., MD [facility] None      No orders of the defined types were placed in this encounter.   -------------------------------

## 2022-09-04 ENCOUNTER — Telehealth: Payer: Self-pay | Admitting: Psychiatry

## 2022-09-04 ENCOUNTER — Other Ambulatory Visit: Payer: Self-pay | Admitting: Family Medicine

## 2022-09-04 DIAGNOSIS — R413 Other amnesia: Secondary | ICD-10-CM

## 2022-09-04 NOTE — Telephone Encounter (Signed)
Pt's wife Jeanett Schlein LVM as advised. If she had not heard anything about apt for MRI by 3/26, to call office.  CC was setting MRI up. Contact # [phone]

## 2022-09-04 NOTE — Telephone Encounter (Signed)
error 

## 2022-09-04 NOTE — Telephone Encounter (Signed)
RTC to [facility] advising CC sent letter to Dr. Donnie Coffin @ [place] [facility] about MRI.

## 2022-09-06 ENCOUNTER — Other Ambulatory Visit: Payer: Self-pay | Admitting: Psychiatry

## 2022-09-06 DIAGNOSIS — F3342 Major depressive disorder, recurrent, in full remission: Secondary | ICD-10-CM

## 2022-09-06 DIAGNOSIS — F411 Generalized anxiety disorder: Secondary | ICD-10-CM

## 2022-09-13 ENCOUNTER — Encounter: Payer: Self-pay | Admitting: Psychiatry

## 2022-09-13 ENCOUNTER — Ambulatory Visit (INDEPENDENT_AMBULATORY_CARE_PROVIDER_SITE_OTHER): Payer: PPO | Admitting: Psychiatry

## 2022-09-13 DIAGNOSIS — R4182 Altered mental status, unspecified: Secondary | ICD-10-CM | POA: Diagnosis not present

## 2022-09-13 DIAGNOSIS — F331 Major depressive disorder, recurrent, moderate: Secondary | ICD-10-CM | POA: Diagnosis not present

## 2022-09-13 DIAGNOSIS — F411 Generalized anxiety disorder: Secondary | ICD-10-CM

## 2022-09-13 DIAGNOSIS — F3342 Major depressive disorder, recurrent, in full remission: Secondary | ICD-10-CM | POA: Diagnosis not present

## 2022-09-13 MED ORDER — VILAZODONE HCL 10 MG PO TABS: 10.0000 mg | ORAL_TABLET | Freq: Every day | ORAL | 1 refills | Status: DC

## 2022-09-13 NOTE — Progress Notes (Signed)
Jake Collins [id number] 11/02/1945 77 y.o.  Subjective:   Patient ID:  Jake Collins is a 77 y.o. (DOB 13-May-1946) male.  Chief Complaint:  Chief Complaint  Patient presents with   Follow-up   Altered Mental Status     Depression        Past medical history includes anxiety.   Anxiety Patient reports no chest pain, nervous/anxious behavior or palpitations.     Caileb Rhue Collins presents to the office today for follow-up of major depression and GAD.  seen June 2020 and was doing well and no meds were changed.  The only psychiatric med for the last few years has been venlafaxine 50 mg daily.  12/07/2019 appointment with the following noted: Remained well.   Concerned about venlafaxine sexual SE.  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 Neither he nor wife notced change in mood anxiety since that time.   Has plenty of money and no longer interested in the money.   Wife is concerned he's moody.   Gym 3 times weekly. Plan: Cont med long term DT high relapse risk.  Has a long history of depression and anxiety.  Has had multiple relapses before.  Been under my care since 2006.  He's reduced venlafaxine to 25 mg daily and still complains of sexual SE but wife complains he's moody. Option lamotrigine, lithium alone, Viibryd. Lithium 150 mg daily for mood and wean off venlafaxine  By 12.5 mg daily for 3 weeks and stop it DT sexual SE.  02/10/20 appt with the following noted: No problems off venlafaxine.  No depression.  Sexual SE a little better.  Moodiness is better. Started melatonin and helped sleep.  No SE lithium. Not more anxious.    02/09/21 appt with following noted:  No depression.   Patient denies any recent difficulty with anxiety.  Patient denies difficulty with sleep initiation or maintenance. Denies appetite disturbance.  Patient reports that energy and motivation have been good.  Patient denies any  difficulty with concentration.  Patient denies any suicidal ideation. Plan: Lithium 150 mg daily for mood and no problems off of the venlafaxine..   10/10/21 appt noted: Wife not happy with me.  She's had 4th and 5th back surgery.  Does all kinds of things for him.  She says he's moodier than normal.  Money is not a problem. More short tempered than he used to be.  Can't play golf bc of knees.  Will need TKR in the next few mos.  Still goes to gym 3 times weekly.  Would like to get back to golf bc no hobbies. PLAN: To avoid sexual SE will pick Trintellix or Viibryd.   Viibryd 10 mg daily. Lithium 150 mg daily for mood and no problems off of the venlafaxine..  11/29/21 appt noted: No effect Viibryd good or bad.  Takes with breakfast. No SE Would like more benefit.   Sleep good.  Lower appetite but wt ok. Plan: Increase Viibryd to  20 mg daily for depression. Lithium 150 mg daily for mood   02/14/22 appt noted: Some additional benefit mood is better and wife agrees. Never been a big talker but is talking more with her.  Married 59 years.  Doesn't feel depressed.  Less irritable with it.   Going on vacation with family.  No GI px with meds. Taking Pepcid.  Viibryd 20 and lithium 150 mg daily. Sexual SE ED.  Can put up with it if it helps. Plan: Better with Viibryd to  20 mg daily for depression. Lithium 150 mg daily for mood   06/20/22 appt noted: Got confused about a change in generic.   Mood is good and pleased with response to Viibryd. Disc low dose lithium. Doing well doc. Plan no med changes  07/17/22 TC from wife reporting H with TKR and then complications and hosp and has been more confused.  Last hosp 07/11/22.   08/15/22 TC:  Wife reports that husband is in bad shape. Any time he deals with stress gets this way. Feels it is his mental condition that is causing problems, not dementia. He doesn't know the grandkids names and can't remember things. She says in his mind " he is fine".     Was put on cancellation list.  08/20/22 appt noted:  seen urgently per wife, Jake Collins, and with her Still on viibryd 20 & lithium 150 daily. I have been a pain in the Butt to live with.  Mainly forgetful since surgery.  Needs repetition.  Trouble with knee.  Doesn't feel right yet.  Only Tylenol for pain.  Times of feeling confused.  Not really depressed but maybe a little. Good appetite.  Sometimes sleeps too much.   W sees confusion, GI bleeding and U incontinence since surgery.   W says after surgery 07/08/22 had not been given his usual meds and he was having panic.  Couple days later UGI bleed and hosp and was confused in hospital.   Per wife this is 4th time in 60 years she's seen a change.  Less involved and less watching golf.  PT noticed he was out of it and was more confused not knowing gkids names. Wife says sleeping 12-15 hours and then some in day.  Was worried about this appt and got dressed at 5 and kept asking if it was time to go to the hospital.   Had lost 15 # thinks he gained some back.   TSH checked about 3 mos ago.  08/30/22 appt noted:  seen with wife Got confused about a doctor's appt.  Wife thinks when low BP he gets more confused.  Has stopped HCTZ. She still feels he's not himself.  A little memory problems before surgery but 50% worse now.   Still knee pain. Family notices he's not himself.   PCP [name] will retire. [facility]. W says past history of paranoid delusions for mos thinking they were going to freeze to death abut not delusional now.   Sleep ok without napping. Wants to drive car again.  09/13/22 appt noted:  seen with wife Had to drive to hospital bc wife had to go and he did ok. Feel better.  W said he's having good days and bad days.  Anger really bugs me. Wife reduced his Viibryd  from 20 to 10 mg daily bc was concerned it might be making him angry. Sleep like a log.   He has trouble remembering pills without help of wife.   Dr.  [name] checking labs upcoming Monday W doesn't know why he's having such memory problems after surgery like trouble finding keys.    Past Psychiatric Medication Trials:  Lithium, Lexapro, Zoloft, Effexor XR 150 sexual SE, Klonopin   M history psych hosp and ECT.  F neglected him. M and B, uncle,  schizophrenic.  D psych tx.  Review of Systems:  Review of Systems  Cardiovascular:  Negative for chest  pain and palpitations.  Gastrointestinal:  Positive for constipation. Negative for diarrhea.  Genitourinary:        ED  Musculoskeletal:  Positive for arthralgias and gait problem.  Neurological:  Negative for tremors.  Psychiatric/Behavioral:  Negative for dysphoric mood. The patient is not nervous/anxious.     Medications: I have reviewed the patient's current medications.  Current Outpatient Medications  Medication Sig Dispense Refill   famotidine (PEPCID) 20 MG tablet Take 20 mg by mouth daily.     latanoprost (XALATAN) 0.005 % ophthalmic solution Place 1 drop into both eyes at bedtime.     levothyroxine (SYNTHROID, LEVOTHROID) 100 MCG tablet Take 100 mcg by mouth daily.       lithium carbonate 150 MG capsule Take 1 capsule (150 mg total) by mouth at bedtime. 90 capsule 1   loratadine (CLARITIN) 10 MG tablet Take 10 mg by mouth daily as needed for allergies.     Melatonin 5 MG CHEW Chew 10 mg by mouth at bedtime. Gummy tabs     nebivolol (BYSTOLIC) 5 MG tablet Take 5 mg by mouth daily.     pantoprazole (PROTONIX) 40 MG tablet Take 1 tablet (40 mg total) by mouth 2 (two) times daily for 30 days, THEN 1 tablet (40 mg total) daily. 90 tablet 0   pyridOXINE (VITAMIN B-6) 100 MG tablet Take 100 mg by mouth daily.     REPATHA SURECLICK 140 MG/ML SOAJ Inject 140 mg into the skin every 14 (fourteen) days.     silodosin (RAPAFLO) 8 MG CAPS capsule Take 1 capsule (8 mg total) by mouth daily with breakfast. 30 capsule 2   Vilazodone HCl (VIIBRYD) 10 MG TABS Take 1 tablet (10 mg total) by mouth  daily. 30 tablet 1   acetaminophen (TYLENOL) 500 MG tablet Take 2 tablets (1,000 mg total) by mouth every 8 (eight) hours as needed. (Patient not taking: Reported on 09/13/2022) 30 tablet 0   cholecalciferol (VITAMIN D3) 25 MCG (1000 UT) tablet Take 1,000 Units by mouth daily. (Patient not taking: Reported on 09/13/2022)     Cyanocobalamin (B-12) 5000 MCG CAPS Take 5,000 mcg by mouth daily. (Patient not taking: Reported on 09/13/2022)     EPINEPHrine 0.3 mg/0.3 mL IJ SOAJ injection Inject 0.3 mg into the muscle as needed for anaphylaxis. (Patient not taking: Reported on 09/13/2022)     glucosamine-chondroitin 500-400 MG tablet Take 1 tablet by mouth daily. (Patient not taking: Reported on 09/13/2022)     Magnesium 250 MG TABS Take 250 mg by mouth daily. (Patient not taking: Reported on 09/13/2022)     Omega 3 1200 MG CAPS Take 1,200 mg by mouth daily. (Patient not taking: Reported on 09/13/2022)     OVER THE COUNTER MEDICATION Take 2 tablets by mouth daily. Neuriva Plus (Patient not taking: Reported on 09/13/2022)     No current facility-administered medications for this visit.    Medication Side Effects: sexual SE  Allergies:  Allergies  Allergen Reactions   Sulfa Antibiotics     Trudie Buckler syndrome     Past Medical History:  Diagnosis Date   Arthritis    Cancer    skin melanoma   Depression    Diverticulitis    2 bouts in 10 years   Hyperlipemia    Hypertension    Hypothyroid    Scoliosis    Thyroid disease     Family History  Problem Relation Age of Onset   Heart disease Father  Social History   Socioeconomic History   Marital status: Married    Spouse name: Not on file   Number of children: Not on file   Years of education: Not on file   Highest education level: Not on file  Occupational History   Not on file  Tobacco Use   Smoking status: Never   Smokeless tobacco: Never  Vaping Use   Vaping Use: Never used  Substance and Sexual Activity   Alcohol use: No   Drug  use: No   Sexual activity: Not Currently  Other Topics Concern   Not on file  Social History Narrative   Not on file   Social Determinants of Health   Financial Resource Strain: Not on file  Food Insecurity: No Food Insecurity (07/12/2022)   Hunger Vital Sign    Worried About Running Out of Food in the Last Year: Never true    Ran Out of Food in the Last Year: Never true  Transportation Needs: No Transportation Needs (07/12/2022)   PRAPARE - Administrator, Civil ServiceTransportation    Lack of Transportation (Medical): No    Lack of Transportation (Non-Medical): No  Physical Activity: Not on file  Stress: Not on file  Social Connections: Not on file  Intimate Partner Violence: Not At Risk (07/12/2022)   Humiliation, Afraid, Rape, and Kick questionnaire    Fear of Current or Ex-Partner: No    Emotionally Abused: No    Physically Abused: No    Sexually Abused: No    Past Medical History, Surgical history, Social history, and Family history were reviewed and updated as appropriate.   Please see review of systems for further details on the patient's review from today.   Objective:   Physical Exam:  There were no vitals taken for this visit.  Physical Exam Constitutional:      General: He is not in acute distress.    Appearance: He is well-developed.  Musculoskeletal:        General: No deformity.  Neurological:     Mental Status: He is alert and oriented to person, place, and time.     Coordination: Coordination normal.  Psychiatric:        Attention and Perception: Attention and perception normal. He does not perceive auditory or visual hallucinations.        Mood and Affect: Mood is not anxious or depressed. Affect is not labile, blunt, tearful or inappropriate.        Speech: Speech normal.        Behavior: Behavior normal.        Thought Content: Thought content normal. Thought content is not delusional. Thought content does not include homicidal or suicidal ideation. Thought content does not  include suicidal plan.        Cognition and Memory: Cognition is impaired. He exhibits impaired recent memory.        Judgment: Judgment normal.     Comments: Insight intact. No delusions.       Lab Review:     Component Value Date/Time   NA 139 07/13/2022 0522   K 3.1 (L) 07/13/2022 0522   CL 110 07/13/2022 0522   CO2 24 07/13/2022 0522   GLUCOSE 102 (H) 07/13/2022 0522   BUN 28 (H) 07/13/2022 0522   CREATININE 1.23 07/13/2022 0522   CALCIUM 8.1 (L) 07/13/2022 0522   PROT 6.0 (L) 07/11/2022 2142   ALBUMIN 2.8 (L) 07/11/2022 2142   AST 28 07/11/2022 2142   ALT 13 07/11/2022 2142  ALKPHOS 44 07/11/2022 2142   BILITOT 0.9 07/11/2022 2142   GFRNONAA >60 07/13/2022 0522   GFRAA >60 04/15/2019 0308       Component Value Date/Time   WBC 8.9 07/13/2022 0522   RBC 2.69 (L) 07/13/2022 0522   HGB 8.5 (L) 07/13/2022 0522   HCT 26.2 (L) 07/13/2022 0522   PLT 171 07/13/2022 0522   MCV 97.4 07/13/2022 0522   MCH 31.6 07/13/2022 0522   MCHC 32.4 07/13/2022 0522   RDW 13.4 07/13/2022 0522   LYMPHSABS 1.2 07/13/2022 0522   MONOABS 1.0 07/13/2022 0522   EOSABS 0.2 07/13/2022 0522   BASOSABS 0.0 07/13/2022 0522    Lithium Lvl  Date Value Ref Range Status  07/13/2022 <0.06 (L) 0.60 - 1.20 mmol/L Final    Comment:    Performed at [facility], [address] [place], [place] [id number]   07/13/22 lithium level not detectable on 150 mg HS.  ? Compliance.  No results found for: "PHENYTOIN", "PHENOBARB", "VALPROATE", "CBMZ"   .res Assessment: Plan:    Jomarie LongsJoseph was seen today for follow-up and altered mental status.  Diagnoses and all orders for this visit:  Major depressive disorder, recurrent episode, moderate -     Vilazodone HCl (VIIBRYD) 10 MG TABS; Take 1 tablet (10 mg total) by mouth daily.  Altered mental status, unspecified altered mental status type  Generalized anxiety disorder -     Vilazodone HCl (VIIBRYD) 10 MG TABS; Take 1 tablet (10 mg  total) by mouth daily.  Depression, major, recurrent, in complete remission   Greater than 50% of 30 min face to face time with patient was spent on counseling and coordination of care.  Seen with wife. We discussed Cont med long term DT high relapse risk.  Has a long history of depression and anxiety.  Has had multiple relapses before.  Been under my care since 2006.   Was doing fine until TKR with complications with ongoing impaired conc and memory.   ? Pseudodementia.  But no evidence of depression other than cognitive problems.  Likely organic.Marland Kitchen. Disc this in detail. Cognition is improving .  Appropriate responses and better abstract reasoning and good fund of knowledge and analysis.  Ok with him driving home with wife in car and they will notifiy us if there's any problems with his driving.  To avoid sexual SE, consider Auvelity but cost might be a problem  Reduced Viibryd 10 mg daily for depression. Lithium 150 mg daily for mood  No SE   TSH normal in Nov by PCP, Dr. Lupe Carneyean Mitchell.  Spoke with Dr. Quita SkyeMitchell's office 08/30/22.  Sent letter to Dr. Clovis RileyMitchell No recent  B12 or brain imaging available and rec this be done MRI 09/29/22 pending.  For cognitive problems continue B complex and NAC 600 mg daily   Defer considering Namenda .  Give it a few more weeks to see if he improves and await lab tests.  Follow-up 4 weeks  Maurice Marcharrie Cottle, MD, DFAPA  Please see After Visit Summary for patient specific instructions.  Future Appointments  Date Time Provider Department Center  09/29/2022  5:10 PM [facility] MR 3 [id number]  12/19/2022  4:30 PM Cottle, Steva Readyarey G Jr., MD [facility] None      No orders of the defined types were placed in this encounter.   -------------------------------

## 2022-09-16 DIAGNOSIS — R413 Other amnesia: Secondary | ICD-10-CM | POA: Diagnosis not present

## 2022-09-25 ENCOUNTER — Other Ambulatory Visit: Payer: Self-pay | Admitting: Psychiatry

## 2022-09-25 DIAGNOSIS — M25661 Stiffness of right knee, not elsewhere classified: Secondary | ICD-10-CM | POA: Diagnosis not present

## 2022-09-25 DIAGNOSIS — F411 Generalized anxiety disorder: Secondary | ICD-10-CM

## 2022-09-25 DIAGNOSIS — M25561 Pain in right knee: Secondary | ICD-10-CM | POA: Diagnosis not present

## 2022-09-25 DIAGNOSIS — F3342 Major depressive disorder, recurrent, in full remission: Secondary | ICD-10-CM

## 2022-09-29 ENCOUNTER — Ambulatory Visit
Admission: RE | Admit: 2022-09-29 | Discharge: 2022-09-29 | Disposition: A | Discharge status: Home / Self Care | Payer: PPO | Source: Ambulatory Visit | Attending: Family Medicine | Admitting: Family Medicine

## 2022-09-29 DIAGNOSIS — I6782 Cerebral ischemia: Secondary | ICD-10-CM | POA: Diagnosis not present

## 2022-09-29 DIAGNOSIS — R413 Other amnesia: Secondary | ICD-10-CM

## 2022-09-29 MED ORDER — GADOPICLENOL 0.5 MMOL/ML IV SOLN
8.0000 mL | Freq: Once | INTRAVENOUS | Status: AC | PRN
  Administered 2022-09-29: 8 mL via INTRAVENOUS

## 2022-10-01 DIAGNOSIS — M25561 Pain in right knee: Secondary | ICD-10-CM | POA: Diagnosis not present

## 2022-10-01 DIAGNOSIS — M25661 Stiffness of right knee, not elsewhere classified: Secondary | ICD-10-CM | POA: Diagnosis not present

## 2022-10-03 DIAGNOSIS — M25561 Pain in right knee: Secondary | ICD-10-CM | POA: Diagnosis not present

## 2022-10-03 DIAGNOSIS — M25661 Stiffness of right knee, not elsewhere classified: Secondary | ICD-10-CM | POA: Diagnosis not present

## 2022-10-07 DIAGNOSIS — R338 Other retention of urine: Secondary | ICD-10-CM | POA: Diagnosis not present

## 2022-10-07 DIAGNOSIS — N401 Enlarged prostate with lower urinary tract symptoms: Secondary | ICD-10-CM | POA: Diagnosis not present

## 2022-10-08 ENCOUNTER — Telehealth: Payer: Self-pay | Admitting: Psychiatry

## 2022-10-08 DIAGNOSIS — H401123 Primary open-angle glaucoma, left eye, severe stage: Secondary | ICD-10-CM | POA: Diagnosis not present

## 2022-10-08 DIAGNOSIS — H401111 Primary open-angle glaucoma, right eye, mild stage: Secondary | ICD-10-CM | POA: Diagnosis not present

## 2022-10-08 DIAGNOSIS — M25561 Pain in right knee: Secondary | ICD-10-CM | POA: Diagnosis not present

## 2022-10-08 DIAGNOSIS — M25661 Stiffness of right knee, not elsewhere classified: Secondary | ICD-10-CM | POA: Diagnosis not present

## 2022-10-08 NOTE — Telephone Encounter (Signed)
Wife called and said that Jake Collins had an mri done on his brain. The results are in his chart under imaging. Please have dr. Jennelle Human look at this before Friday appointment. Also they are open to suggestions on where to go for a neurologist. If you have any questions please call wife [phone]

## 2022-10-08 NOTE — Telephone Encounter (Signed)
Please see message from wife. Brain MRI results available. She wanted you to view before his appt on Friday.

## 2022-10-10 ENCOUNTER — Other Ambulatory Visit: Payer: Self-pay | Admitting: *Deleted

## 2022-10-10 DIAGNOSIS — M25661 Stiffness of right knee, not elsewhere classified: Secondary | ICD-10-CM | POA: Diagnosis not present

## 2022-10-10 DIAGNOSIS — M25561 Pain in right knee: Secondary | ICD-10-CM | POA: Diagnosis not present

## 2022-10-10 NOTE — Progress Notes (Signed)
Error

## 2022-10-11 ENCOUNTER — Ambulatory Visit (INDEPENDENT_AMBULATORY_CARE_PROVIDER_SITE_OTHER): Payer: PPO | Admitting: Psychiatry

## 2022-10-11 ENCOUNTER — Encounter: Payer: Self-pay | Admitting: Psychiatry

## 2022-10-11 DIAGNOSIS — R4182 Altered mental status, unspecified: Secondary | ICD-10-CM

## 2022-10-11 DIAGNOSIS — F411 Generalized anxiety disorder: Secondary | ICD-10-CM

## 2022-10-11 DIAGNOSIS — F331 Major depressive disorder, recurrent, moderate: Secondary | ICD-10-CM

## 2022-10-11 NOTE — Progress Notes (Signed)
JERRALD TING [id number] [birth year] 77 y.o.  Subjective:   Patient ID:  Jake Collins is a 77 y.o. (DOB 06/21/1945) male.  Chief Complaint:  Chief Complaint  Patient presents with   Follow-up   Depression   Anxiety   Altered Mental Status   Memory Loss     Depression        Past medical history includes anxiety.   Anxiety Patient reports no chest pain, nervous/anxious behavior or palpitations.     Dontay Burdell Aube presents to the office today for follow-up of major depression and GAD.  seen June 2020 and was doing well and no meds were changed.  The only psychiatric med for the last few years has been venlafaxine 50 mg daily.  12/07/2019 appointment with the following noted: Remained well.   Concerned about venlafaxine sexual SE.  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 Neither he nor wife notced change in mood anxiety since that time.   Has plenty of money and no longer interested in the money.   Wife is concerned he's moody.   Gym 3 times weekly. Plan: Cont med long term DT high relapse risk.  Has a long history of depression and anxiety.  Has had multiple relapses before.  Been under my care since 2006.  He's reduced venlafaxine to 25 mg daily and still complains of sexual SE but wife complains he's moody. Option lamotrigine, lithium alone, Viibryd. Lithium 150 mg daily for mood and wean off venlafaxine  By 12.5 mg daily for 3 weeks and stop it DT sexual SE.  02/10/20 appt with the following noted: No problems off venlafaxine.  No depression.  Sexual SE a little better.  Moodiness is better. Started melatonin and helped sleep.  No SE lithium. Not more anxious.    02/09/21 appt with following noted:  No depression.   Patient denies any recent difficulty with anxiety.  Patient denies difficulty with sleep initiation or maintenance. Denies appetite disturbance.  Patient reports that energy and motivation  have been good.  Patient denies any difficulty with concentration.  Patient denies any suicidal ideation. Plan: Lithium 150 mg daily for mood and no problems off of the venlafaxine..   10/10/21 appt noted: Wife not happy with me.  She's had 4th and 5th back surgery.  Does all kinds of things for him.  She says he's moodier than normal.  Money is not a problem. More short tempered than he used to be.  Can't play golf bc of knees.  Will need TKR in the next few mos.  Still goes to gym 3 times weekly.  Would like to get back to golf bc no hobbies. PLAN: To avoid sexual SE will pick Trintellix or Viibryd.   Viibryd 10 mg daily. Lithium 150 mg daily for mood and no problems off of the venlafaxine..  11/29/21 appt noted: No effect Viibryd good or bad.  Takes with breakfast. No SE Would like more benefit.   Sleep good.  Lower appetite but wt ok. Plan: Increase Viibryd to  20 mg daily for depression. Lithium 150 mg daily for mood   02/14/22 appt noted: Some additional benefit mood is better and wife agrees. Never been a big talker but is talking more with her.  Married 59 years.  Doesn't feel depressed.  Less irritable with it.   Going on vacation with family.  No GI px with meds. Taking Pepcid.  Viibryd  20 and lithium 150 mg daily. Sexual SE ED.  Can put up with it if it helps. Plan: Better with Viibryd to  20 mg daily for depression. Lithium 150 mg daily for mood   06/20/22 appt noted: Got confused about a change in generic.   Mood is good and pleased with response to Viibryd. Disc low dose lithium. Doing well doc. Plan no med changes  07/17/22 TC from wife reporting H with TKR and then complications and hosp and has been more confused.  Last hosp 07/11/22.   08/15/22 TC:  Wife reports that husband is in bad shape. Any time he deals with stress gets this way. Feels it is his mental condition that is causing problems, not dementia. He doesn't know the grandkids names and can't remember things. She  says in his mind " he is fine".    Was put on cancellation list.  08/20/22 appt noted:  seen urgently per wife, [name], and with her Still on viibryd 20 & lithium 150 daily. I have been a pain in the Butt to live with.  Mainly forgetful since surgery.  Needs repetition.  Trouble with knee.  Doesn't feel right yet.  Only Tylenol for pain.  Times of feeling confused.  Not really depressed but maybe a little. Good appetite.  Sometimes sleeps too much.   W sees confusion, GI bleeding and U incontinence since surgery.   W says after surgery 07/08/22 had not been given his usual meds and he was having panic.  Couple days later UGI bleed and hosp and was confused in hospital.   Per wife this is 4th time in 60 years she's seen a change.  Less involved and less watching golf.  PT noticed he was out of it and was more confused not knowing gkids names. Wife says sleeping 12-15 hours and then some in day.  Was worried about this appt and got dressed at 5 and kept asking if it was time to go to the hospital.   Had lost 15 # thinks he gained some back.   TSH checked about 3 mos ago.  08/30/22 appt noted:  seen with wife Got confused about a doctor's appt.  Wife thinks when low BP he gets more confused.  Has stopped HCTZ. She still feels he's not himself.  A little memory problems before surgery but 50% worse now.   Still knee pain. Family notices he's not himself.   PCP [name] will retire. [facility]. W says past history of paranoid delusions for mos thinking they were going to freeze to death abut not delusional now.   Sleep ok without napping. Wants to drive car again.  09/13/22 appt noted:  seen with wife Had to drive to hospital bc wife had to go and he did ok. Feel better.  W said he's having good days and bad days.  Anger really bugs me. Wife reduced his Viibryd  from 20 to 10 mg daily bc was concerned it might be making him angry. Sleep like a log.   He has trouble remembering pills  without help of wife.   Dr. [name] checking labs upcoming Monday W doesn't know why he's having such memory problems after surgery like trouble finding keys.    10/11/22 appt noted:  with wife [name] Denies dep.  Wife agrees he's got more interest in other things and is better than he was.   It was a bad hole I was in after that operation.  Wife  points out he couldn't say that before but is better now. She says this is first week without doc's appts.   No anxiety or fear. Sleep good.   She says he still has problems with meds but is cognitively better. Seems less antsy with Viibryd 10 vs 20 mg daily. No problms walking.  Memory is still behind what it does.   Good appetite.   Remote history of afib.    Past Psychiatric Medication Trials:  Lithium, Lexapro, Zoloft, Effexor XR 150 sexual SE, Klonopin   M history psych hosp and ECT.  F neglected him. M and B, uncle,  schizophrenic.  D psych tx.  Review of Systems:  Review of Systems  Cardiovascular:  Negative for chest pain and palpitations.  Gastrointestinal:  Positive for constipation. Negative for diarrhea.  Genitourinary:        ED  Musculoskeletal:  Positive for arthralgias and gait problem.  Neurological:  Negative for tremors.  Psychiatric/Behavioral:  Negative for dysphoric mood. The patient is not nervous/anxious.     Medications: I have reviewed the patient's current medications.  Current Outpatient Medications  Medication Sig Dispense Refill   acetaminophen (TYLENOL) 500 MG tablet Take 2 tablets (1,000 mg total) by mouth every 8 (eight) hours as needed. 30 tablet 0   cholecalciferol (VITAMIN D3) 25 MCG (1000 UT) tablet Take 1,000 Units by mouth daily.     Cyanocobalamin (B-12) 5000 MCG CAPS Take 5,000 mcg by mouth daily.     EPINEPHrine 0.3 mg/0.3 mL IJ SOAJ injection Inject 0.3 mg into the muscle as needed for anaphylaxis.     famotidine (PEPCID) 20 MG tablet Take 20 mg by mouth daily.     glucosamine-chondroitin  500-400 MG tablet Take 1 tablet by mouth daily.     latanoprost (XALATAN) 0.005 % ophthalmic solution Place 1 drop into both eyes at bedtime.     levothyroxine (SYNTHROID, LEVOTHROID) 100 MCG tablet Take 100 mcg by mouth daily.       lithium carbonate 150 MG capsule TAKE 1 CAPSULE BY MOUTH EVERYDAY AT BEDTIME 30 capsule 0   loratadine (CLARITIN) 10 MG tablet Take 10 mg by mouth daily as needed for allergies.     Magnesium 250 MG TABS Take 250 mg by mouth daily.     Melatonin 5 MG CHEW Chew 10 mg by mouth at bedtime. Gummy tabs     nebivolol (BYSTOLIC) 5 MG tablet Take 5 mg by mouth daily.     Omega 3 1200 MG CAPS Take 1,200 mg by mouth daily.     OVER THE COUNTER MEDICATION Take 2 tablets by mouth daily. Neuriva Plus     pyridOXINE (VITAMIN B-6) 100 MG tablet Take 100 mg by mouth daily.     REPATHA SURECLICK 140 MG/ML SOAJ Inject 140 mg into the skin every 14 (fourteen) days.     silodosin (RAPAFLO) 8 MG CAPS capsule Take 1 capsule (8 mg total) by mouth daily with breakfast. 30 capsule 2   Vilazodone HCl (VIIBRYD) 10 MG TABS Take 1 tablet (10 mg total) by mouth daily. 30 tablet 1   pantoprazole (PROTONIX) 40 MG tablet Take 1 tablet (40 mg total) by mouth 2 (two) times daily for 30 days, THEN 1 tablet (40 mg total) daily. 90 tablet 0   No current facility-administered medications for this visit.    Medication Side Effects: sexual SE  Allergies:  Allergies  Allergen Reactions   Sulfa Antibiotics     Trudie Buckler syndrome  Past Medical History:  Diagnosis Date   Arthritis    Cancer (HCC)    skin melanoma   Depression    Diverticulitis    2 bouts in 10 years   Hyperlipemia    Hypertension    Hypothyroid    Scoliosis    Thyroid disease     Family History  Problem Relation Age of Onset   Heart disease Father     Social History   Socioeconomic History   Marital status: Married    Spouse name: Not on file   Number of children: Not on file   Years of education: Not  on file   Highest education level: Not on file  Occupational History   Not on file  Tobacco Use   Smoking status: Never   Smokeless tobacco: Never  Vaping Use   Vaping Use: Never used  Substance and Sexual Activity   Alcohol use: No   Drug use: No   Sexual activity: Not Currently  Other Topics Concern   Not on file  Social History Narrative   Not on file   Social Determinants of Health   Financial Resource Strain: Not on file  Food Insecurity: No Food Insecurity (07/12/2022)   Hunger Vital Sign    Worried About Running Out of Food in the Last Year: Never true    Ran Out of Food in the Last Year: Never true  Transportation Needs: No Transportation Needs (07/12/2022)   PRAPARE - Administrator, Civil Service (Medical): No    Lack of Transportation (Non-Medical): No  Physical Activity: Not on file  Stress: Not on file  Social Connections: Not on file  Intimate Partner Violence: Not At Risk (07/12/2022)   Humiliation, Afraid, Rape, and Kick questionnaire    Fear of Current or Ex-Partner: No    Emotionally Abused: No    Physically Abused: No    Sexually Abused: No    Past Medical History, Surgical history, Social history, and Family history were reviewed and updated as appropriate.   Please see review of systems for further details on the patient's review from today.   Objective:   Physical Exam:  There were no vitals taken for this visit.  Physical Exam Constitutional:      General: He is not in acute distress.    Appearance: He is well-developed.  Musculoskeletal:        General: No deformity.  Neurological:     Mental Status: He is alert and oriented to person, place, and time.     Coordination: Coordination normal.  Psychiatric:        Attention and Perception: Attention and perception normal. He does not perceive auditory or visual hallucinations.        Mood and Affect: Mood is not anxious or depressed. Affect is not labile, blunt, tearful or  inappropriate.        Speech: Speech normal.        Behavior: Behavior normal.        Thought Content: Thought content normal. Thought content is not delusional. Thought content does not include homicidal or suicidal ideation. Thought content does not include suicidal plan.        Cognition and Memory: Cognition is not impaired. He does not exhibit impaired recent memory.        Judgment: Judgment normal.     Comments: Insight intact. No delusions.  Mild forgetfulness but not severe.      Lab Review:  Component Value Date/Time   NA 139 07/13/2022 0522   K 3.1 (L) 07/13/2022 0522   CL 110 07/13/2022 0522   CO2 24 07/13/2022 0522   GLUCOSE 102 (H) 07/13/2022 0522   BUN 28 (H) 07/13/2022 0522   CREATININE 1.23 07/13/2022 0522   CALCIUM 8.1 (L) 07/13/2022 0522   PROT 6.0 (L) 07/11/2022 2142   ALBUMIN 2.8 (L) 07/11/2022 2142   AST 28 07/11/2022 2142   ALT 13 07/11/2022 2142   ALKPHOS 44 07/11/2022 2142   BILITOT 0.9 07/11/2022 2142   GFRNONAA >60 07/13/2022 0522   GFRAA >60 04/15/2019 0308       Component Value Date/Time   WBC 8.9 07/13/2022 0522   RBC 2.69 (L) 07/13/2022 0522   HGB 8.5 (L) 07/13/2022 0522   HCT 26.2 (L) 07/13/2022 0522   PLT 171 07/13/2022 0522   MCV 97.4 07/13/2022 0522   MCH 31.6 07/13/2022 0522   MCHC 32.4 07/13/2022 0522   RDW 13.4 07/13/2022 0522   LYMPHSABS 1.2 07/13/2022 0522   MONOABS 1.0 07/13/2022 0522   EOSABS 0.2 07/13/2022 0522   BASOSABS 0.0 07/13/2022 0522    Lithium Lvl  Date Value Ref Range Status  07/13/2022 <0.06 (L) 0.60 - 1.20 mmol/L Final    Comment:    Performed at [facility], [address], [place], [place] [address]   07/13/22 lithium level not detectable on 150 mg HS.  ? Compliance.  09/29/22 MRI brain: IMPRESSION: 1. No acute intracranial process. 2. Central pattern of cerebral volume loss and bilateral hippocampal atrophy. 3. Moderate chronic small vessel disease with old infarcts in  the right middle frontal gyrus, left putamen, and right cerebellar hemisphere. 4. Sulcal hemosiderin staining along the right superior frontal sulcus, likely sequela of prior subarachnoid hemorrhage.  .res Assessment: Plan:    Watson was seen today for follow-up, depression, anxiety, altered mental status and memory loss.  Diagnoses and all orders for this visit:  Major depressive disorder, recurrent episode, moderate (HCC)  Altered mental status, unspecified altered mental status type  Generalized anxiety disorder   Greater than 50% of 30 min face to face time with patient was spent on counseling and coordination of care.  Seen with wife. We discussed Cont med long term DT high relapse risk.  Has a long history of depression and anxiety.  Has had multiple relapses before.  Been under my care since 2006.   Was doing fine until TKR with complications with ongoing impaired conc and memory.   ? Pseudodementia.  But no evidence of depression other than cognitive problems.  Likely organic.Marland Kitchen Disc this in detail. Cognition is improving .  Appropriate responses and better abstract reasoning and good fund of knowledge and analysis.  Ok with him driving home with wife in car and they will notifiy [place] if there's any problems with his driving.  To avoid sexual SE, consider Auvelity but cost might be a problem  Reduced Viibryd 10 mg daily for depression. Lithium 150 mg daily for mood  No SE   TSH normal in Nov by PCP, Dr. [name].  Spoke with Dr. Quita Skye office 08/30/22.  Sent letter to Dr. [name] No recent  B12   09/29/22 MRI brain: IMPRESSION: 1. No acute intracranial process. 2. Central pattern of cerebral volume loss and bilateral hippocampal atrophy. 3. Moderate chronic small vessel disease with old infarcts in the right middle frontal gyrus, left putamen, and right cerebellar hemisphere. 4. Sulcal hemosiderin staining along the right  superior frontal sulcus, likely  sequela of prior subarachnoid hemorrhage. Rec see neuro to eval whether he should be on any preventative for stroke.   Send referral to [facility] to evaluate whether or not further action needed.    For cognitive problems continue B complex and NAC 600 mg daily  No med changes indicated  Defer considering Namenda .    Follow-up 8 weeks  Maurice March, MD, DFAPA  Please see After Visit Summary for patient specific instructions.  Future Appointments  Date Time Provider Department Center  12/19/2022  4:30 PM Cottle, Steva Ready., MD [facility] None      No orders of the defined types were placed in this encounter.   -------------------------------

## 2022-10-14 ENCOUNTER — Telehealth: Payer: Self-pay | Admitting: Psychiatry

## 2022-10-14 NOTE — Telephone Encounter (Signed)
Referral has been faxed to [facility] 

## 2022-10-16 ENCOUNTER — Encounter: Payer: Self-pay | Admitting: Diagnostic Neuroimaging

## 2022-10-16 ENCOUNTER — Ambulatory Visit (INDEPENDENT_AMBULATORY_CARE_PROVIDER_SITE_OTHER): Payer: PPO | Admitting: Diagnostic Neuroimaging

## 2022-10-16 VITALS — BP 151/78 | HR 58 | Ht 70.0 in | Wt 178.0 lb

## 2022-10-16 DIAGNOSIS — I679 Cerebrovascular disease, unspecified: Secondary | ICD-10-CM

## 2022-10-16 DIAGNOSIS — R413 Other amnesia: Secondary | ICD-10-CM

## 2022-10-16 MED ORDER — MEMANTINE HCL 10 MG PO TABS
10.0000 mg | ORAL_TABLET | Freq: Two times a day (BID) | ORAL | 12 refills | Status: DC
Start: 2022-10-16 — End: 2022-11-11

## 2022-10-16 NOTE — Progress Notes (Signed)
[facility]  PATIENT: Jake Collins DOB: 1945/11/25  REFERRING CLINICIAN: [name], L.August Saucer, MD HISTORY FROM: patient and wife REASON FOR VISIT: new consult   HISTORICAL  CHIEF COMPLAINT:  Chief Complaint  Patient presents with   New Patient (Initial Visit)    Pt with wife, rm 7. He completed a MRI month 4/21. Went in for knee replacement surgery Jan 29 and since surgery he has been a different person.he continues to have memory concerns. There has been improvement in cognition but not back to baseline     HISTORY OF PRESENT ILLNESS:   77 year old male here for eval patient of memory and cognitive changes.  For past 2 to 3 years patient has had onset of mild short-term memory problems, repeating himself, losing keys, losing phone, having trouble managing medications and finances.  Has been treated for anxiety and mood disorders for many years, currently being treated by psychiatry.  Mild memory cognitions were noted by family but not by patient.  January 2024 patient underwent right total knee replacement and had significant postoperative confusion and delirium which lasted for a few days.  After discharge he returned to the hospital for upper GI bleed symptoms.  Continues to have some memory and cognitive issues.   REVIEW OF SYSTEMS: Full 14 system review of systems performed and negative with exception of: as per HPI.  ALLERGIES: Allergies  Allergen Reactions   Sulfa Antibiotics     Trudie Buckler syndrome     HOME MEDICATIONS: Outpatient Medications Prior to Visit  Medication Sig Dispense Refill   acetaminophen (TYLENOL) 500 MG tablet Take 2 tablets (1,000 mg total) by mouth every 8 (eight) hours as needed. 30 tablet 0   Acetylcysteine (NAC 600 PO) Take 1 tablet by mouth daily.     glucosamine-chondroitin 500-400 MG tablet Take 1 tablet by mouth daily.     latanoprost (XALATAN) 0.005 % ophthalmic solution Place 1 drop into both eyes at bedtime.      levothyroxine (SYNTHROID, LEVOTHROID) 100 MCG tablet Take 100 mcg by mouth daily.       lithium carbonate 150 MG capsule TAKE 1 CAPSULE BY MOUTH EVERYDAY AT BEDTIME 30 capsule 0   loratadine (CLARITIN) 10 MG tablet Take 10 mg by mouth daily as needed for allergies.     Melatonin 5 MG CHEW Chew 10 mg by mouth at bedtime. Gummy tabs     Multiple Vitamins-Minerals (CENTRUM SILVER PO) Take 1 tablet by mouth daily.     nebivolol (BYSTOLIC) 5 MG tablet Take 5 mg by mouth daily.     pantoprazole (PROTONIX) 40 MG tablet Take 40 mg by mouth daily.     REPATHA SURECLICK 140 MG/ML SOAJ Inject 140 mg into the skin every 14 (fourteen) days.     silodosin (RAPAFLO) 8 MG CAPS capsule Take 1 capsule (8 mg total) by mouth daily with breakfast. 30 capsule 2   Vilazodone HCl (VIIBRYD) 10 MG TABS Take 1 tablet (10 mg total) by mouth daily. 30 tablet 1   cholecalciferol (VITAMIN D3) 25 MCG (1000 UT) tablet Take 1,000 Units by mouth daily.     Cyanocobalamin (B-12) 5000 MCG CAPS Take 5,000 mcg by mouth daily.     EPINEPHrine 0.3 mg/0.3 mL IJ SOAJ injection Inject 0.3 mg into the muscle as needed for anaphylaxis.     famotidine (PEPCID) 20 MG tablet Take 20 mg by mouth daily.     Magnesium 250 MG TABS Take 250 mg by mouth daily.  Omega 3 1200 MG CAPS Take 1,200 mg by mouth daily.     OVER THE COUNTER MEDICATION Take 2 tablets by mouth daily. Neuriva Plus     pantoprazole (PROTONIX) 40 MG tablet Take 1 tablet (40 mg total) by mouth 2 (two) times daily for 30 days, THEN 1 tablet (40 mg total) daily. 90 tablet 0   pyridOXINE (VITAMIN B-6) 100 MG tablet Take 100 mg by mouth daily.     No facility-administered medications prior to visit.    PAST MEDICAL HISTORY: Past Medical History:  Diagnosis Date   Arthritis    Cancer (HCC)    skin melanoma   Depression    Diverticulitis    2 bouts in 10 years   Hyperlipemia    Hypertension    Hypothyroid    Scoliosis    Thyroid disease     PAST SURGICAL  HISTORY: Past Surgical History:  Procedure Laterality Date   APPENDECTOMY     CATARACT EXTRACTION W/ INTRAOCULAR LENS IMPLANT Bilateral    COLONOSCOPY     ESOPHAGOGASTRODUODENOSCOPY N/A 07/12/2022   Procedure: ESOPHAGOGASTRODUODENOSCOPY (EGD);  Surgeon: Vida Rigger, MD;  Location: [facility];  Service: Gastroenterology;  Laterality: N/A;   EYE SURGERY     HERNIA REPAIR     KNEE ARTHROSCOPY Right     x2   KNEE SURGERY Left    LAPAROSCOPIC APPENDECTOMY  05/28/2011   Procedure: APPENDECTOMY LAPAROSCOPIC;  Surgeon: Mariella Saa, MD;  Location: [facility];  Service: General;  Laterality: N/A;   PAROTID GLAND TUMOR EXCISION     ROOT CANAL     ROTATOR CUFF REPAIR     bil    SALIVARY GLAND SURGERY     L side   SKIN CANCER EXCISION     on his back and was benign   TOTAL HIP ARTHROPLASTY Left 04/14/2019   Procedure: TOTAL HIP ARTHROPLASTY ANTERIOR APPROACH;  Surgeon: Ollen Gross, MD;  Location: [facility];  Service: Orthopedics;  Laterality: Left;    TOTAL KNEE ARTHROPLASTY Right 07/08/2022   Procedure: TOTAL KNEE ARTHROPLASTY;  Surgeon: Ollen Gross, MD;  Location: [facility];  Service: Orthopedics;  Laterality: Right;    FAMILY HISTORY: Family History  Problem Relation Age of Onset   Heart disease Father     SOCIAL HISTORY: Social History   Socioeconomic History   Marital status: Married    Spouse name: Not on file   Number of children: Not on file   Years of education: Not on file   Highest education level: Not on file  Occupational History   Not on file  Tobacco Use   Smoking status: Never   Smokeless tobacco: Never  Vaping Use   Vaping Use: Never used  Substance and Sexual Activity   Alcohol use: No   Drug use: No   Sexual activity: Not Currently  Other Topics Concern   Not on file  Social History Narrative   Not on file   Social Determinants of Health   Financial Resource Strain: Not on file  Food Insecurity: No Food Insecurity (07/12/2022)   Hunger  Vital Sign    Worried About Running Out of Food in the Last Year: Never true    Ran Out of Food in the Last Year: Never true  Transportation Needs: No Transportation Needs (07/12/2022)   PRAPARE - Administrator, Civil Service (Medical): No    Lack of Transportation (Non-Medical): No  Physical Activity: Not on file  Stress: Not on  file  Social Connections: Not on file  Intimate Partner Violence: Not At Risk (07/12/2022)   Humiliation, Afraid, Rape, and Kick questionnaire    Fear of Current or Ex-Partner: No    Emotionally Abused: No    Physically Abused: No    Sexually Abused: No     PHYSICAL EXAM  GENERAL EXAM/CONSTITUTIONAL: Vitals:  Vitals:   10/16/22 0835  BP: (!) 151/78  Pulse: (!) 58  Weight: 178 lb (80.7 kg)  Height: 5\' 10"  (1.778 m)   Body mass index is 25.54 kg/m. Wt Readings from Last 3 Encounters:  10/16/22 178 lb (80.7 kg)  07/15/22 191 lb 12.8 oz (87 kg)  07/08/22 177 lb (80.3 kg)   Patient is in no distress; well developed, nourished and groomed; neck is supple  CARDIOVASCULAR: Examination of carotid arteries is normal; no carotid bruits Regular rate and rhythm, no murmurs Examination of peripheral vascular system by observation and palpation is normal  EYES: Ophthalmoscopic exam of optic discs and posterior segments is normal; no papilledema or hemorrhages No results found.  MUSCULOSKELETAL: Gait, strength, tone, movements noted in Neurologic exam below  NEUROLOGIC: MENTAL STATUS:     10/16/2022    8:29 AM  MMSE - Mini Mental State Exam  Orientation to time 3  Orientation to Place 5  Registration 3  Attention/ Calculation 1  Recall 1  Language- name 2 objects 2  Language- repeat 1  Language- follow 3 step command 3  Language- read & follow direction 1  Write a sentence 1  Copy design 1  Total score 22   awake, alert, oriented to person, place and time recent and remote memory intact normal attention and  concentration language fluent, comprehension intact, naming intact fund of knowledge appropriate  CRANIAL NERVE:  2nd - no papilledema on fundoscopic exam 2nd, 3rd, 4th, 6th - pupils equal and reactive to light, visual fields full to confrontation, extraocular muscles intact, no nystagmus 5th - facial sensation symmetric 7th - facial strength symmetric 8th - hearing intact 9th - palate elevates symmetrically, uvula midline 11th - shoulder shrug symmetric 12th - tongue protrusion midline MASKED FACIES  MOTOR:  INCREASED TONE IN BUE normal bulk and tone, full strength in the BUE, BLE  SENSORY:  normal and symmetric to light touch, temperature, vibration  COORDINATION:  finger-nose-finger, fine finger movements SLOW  REFLEXES:  deep tendon reflexes --> BRISK IN BUE AND KNEES; 1 AT ANKLES; NO CLONUS; BORDERLINE HOFFMANS POSITIVE MYERSONS, SNOUT, PALMOMENTAL REFLEXES  GAIT/STATION:  narrow based gait; SHORT STEPS; SLOW GAIT; UNSTEADY     DIAGNOSTIC DATA (LABS, IMAGING, TESTING) - I reviewed patient records, labs, notes, testing and imaging myself where available.  Lab Results  Component Value Date   WBC 8.9 07/13/2022   HGB 8.5 (L) 07/13/2022   HCT 26.2 (L) 07/13/2022   MCV 97.4 07/13/2022   PLT 171 07/13/2022      Component Value Date/Time   NA 139 07/13/2022 0522   K 3.1 (L) 07/13/2022 0522   CL 110 07/13/2022 0522   CO2 24 07/13/2022 0522   GLUCOSE 102 (H) 07/13/2022 0522   BUN 28 (H) 07/13/2022 0522   CREATININE 1.23 07/13/2022 0522   CALCIUM 8.1 (L) 07/13/2022 0522   PROT 6.0 (L) 07/11/2022 2142   ALBUMIN 2.8 (L) 07/11/2022 2142   AST 28 07/11/2022 2142   ALT 13 07/11/2022 2142   ALKPHOS 44 07/11/2022 2142   BILITOT 0.9 07/11/2022 2142   GFRNONAA >60 07/13/2022 0522   GFRAA >  60 04/15/2019 0308   No results found for: "CHOL", "HDL", "LDLCALC", "LDLDIRECT", "TRIG", "CHOLHDL" No results found for: "HGBA1C" No results found for: "VITAMINB12" No results  found for: "TSH"   09/29/22 MRI brain [I reviewed images myself and agree with interpretation. Reviewed images and report with patient and wife. -VRP]  1. No acute intracranial process. 2. Central pattern of cerebral volume loss and bilateral hippocampal atrophy. 3. Moderate chronic small vessel disease with old infarcts in the right middle frontal gyrus, left putamen, and right cerebellar hemisphere. 4. Sulcal hemosiderin staining along the right superior frontal sulcus, likely sequela of prior subarachnoid hemorrhage.    ASSESSMENT AND PLAN  77 y.o. year old male here with mild, gradual onset progressive memory and cognitive changes with mild changes in ADLs since approximately 2021.  Also with longer-term history of anxiety and mood disorder.   Dx:  1. Memory loss   2. Small vessel disease, cerebrovascular     PLAN:  MILD DEMENTIA (MMSE 22/30; mild changes in ADLs; positive frontal release signs; brain atrophy and chronic small vessel ischemic disease on MRI; could be due to neurodegenerative vs vascular dementia) - start memantine 10mg  at bedtime; increase to twice a day after 1-2 weeks - safety / supervision issues reviewed - daily physical activity / exercise (at least 15-30 minutes) - eat more plants / vegetables - increase social activities, brain stimulation, games, puzzles, hobbies, crafts, arts, music - aim for at least 7-8 hours sleep per night (or more) - avoid smoking and alcohol - caution with medications, finances, driving  MILD-MODERATE chronic small vessel ischemic disease + infarcts in the brain (silent; no clinical stroke events; related to underlying hypertension and hyperlipidemia; sulcal hemosiderin deposition and staining could be related to underlying small vessel ischemic disease versus amyloid angiopathy; cannot rule out prior head trauma) - continue BP and lipid control - do not recommend aspirin at this time due to GI bleeding history, hemosiderin /  siderosis in the brain - check carotid u/s and echocardiogram  GAIT DIFFICULTY / HYPERREFLEXIA - concern for cervical myelopathy; not interested in surgery; could consider MRI cervical spine; fall precautions reviewed  Orders Placed This Encounter  Procedures   ECHOCARDIOGRAM COMPLETE BUBBLE STUDY   VAS US CAROTID   Meds ordered this encounter  Medications   memantine (NAMENDA) 10 MG tablet    Sig: Take 1 tablet (10 mg total) by mouth 2 (two) times daily.    Dispense:  60 tablet    Refill:  12   Return in about 1 year (around 10/16/2023) for pending if symptoms worsen or fail to improve, pending test results.  I spent 75 minutes of face-to-face and non-face-to-face time with patient.  This included previsit chart review, lab review, study review, order entry, electronic health record documentation, patient education.    Suanne Marker, MD 10/16/2022, 10:02 AM Certified in Neurology, Neurophysiology and Neuroimaging  [facility] [address], [address] [place], [place] [address] [phone]

## 2022-10-16 NOTE — Patient Instructions (Signed)
  MILD-MODERATE chronic small vessel ischemic disease + infarcts in the brain (silent; no clinical stroke events) - continue BP and lipid control - do not recommend aspirin at this time due to GI bleeding history, hemosiderin / siderosis in the brain - recommend to check carotid u/s and echocardiogram  MILD DEMENTIA (MMSE 22/30; mild changes in ADLs; could be due to neurodegenerative vs vascular dementia) - start memantine 10mg  at bedtime; increase to twice a day after 1-2 weeks - safety / supervision issues reviewed - daily physical activity / exercise (at least 15-30 minutes) - eat more plants / vegetables - increase social activities, brain stimulation, games, puzzles, hobbies, crafts, arts, music - aim for at least 7-8 hours sleep per night (or more) - avoid smoking and alcohol - caution with medications, finances, driving

## 2022-10-23 ENCOUNTER — Other Ambulatory Visit: Payer: Self-pay | Admitting: Psychiatry

## 2022-10-23 DIAGNOSIS — F411 Generalized anxiety disorder: Secondary | ICD-10-CM

## 2022-10-23 DIAGNOSIS — F3342 Major depressive disorder, recurrent, in full remission: Secondary | ICD-10-CM

## 2022-10-25 ENCOUNTER — Ambulatory Visit (HOSPITAL_COMMUNITY)
Admission: RE | Admit: 2022-10-25 | Discharge: 2022-10-25 | Disposition: A | Discharge status: Home / Self Care | Payer: PPO | Source: Ambulatory Visit | Attending: Diagnostic Neuroimaging | Admitting: Diagnostic Neuroimaging

## 2022-10-25 DIAGNOSIS — I679 Cerebrovascular disease, unspecified: Secondary | ICD-10-CM | POA: Insufficient documentation

## 2022-10-25 DIAGNOSIS — R413 Other amnesia: Secondary | ICD-10-CM | POA: Diagnosis not present

## 2022-10-25 DIAGNOSIS — M25512 Pain in left shoulder: Secondary | ICD-10-CM | POA: Diagnosis not present

## 2022-11-08 ENCOUNTER — Other Ambulatory Visit: Payer: Self-pay | Admitting: Diagnostic Neuroimaging

## 2022-11-13 DIAGNOSIS — K449 Diaphragmatic hernia without obstruction or gangrene: Secondary | ICD-10-CM | POA: Diagnosis not present

## 2022-11-13 DIAGNOSIS — K2210 Ulcer of esophagus without bleeding: Secondary | ICD-10-CM | POA: Diagnosis not present

## 2022-11-14 DIAGNOSIS — E892 Postprocedural hypoparathyroidism: Secondary | ICD-10-CM | POA: Diagnosis not present

## 2022-11-14 DIAGNOSIS — R413 Other amnesia: Secondary | ICD-10-CM | POA: Diagnosis not present

## 2022-11-14 DIAGNOSIS — E7800 Pure hypercholesterolemia, unspecified: Secondary | ICD-10-CM | POA: Diagnosis not present

## 2022-11-14 DIAGNOSIS — K219 Gastro-esophageal reflux disease without esophagitis: Secondary | ICD-10-CM | POA: Diagnosis not present

## 2022-11-14 DIAGNOSIS — Z0000 Encounter for general adult medical examination without abnormal findings: Secondary | ICD-10-CM | POA: Diagnosis not present

## 2022-11-14 DIAGNOSIS — E039 Hypothyroidism, unspecified: Secondary | ICD-10-CM | POA: Diagnosis not present

## 2022-11-14 DIAGNOSIS — I10 Essential (primary) hypertension: Secondary | ICD-10-CM | POA: Diagnosis not present

## 2022-11-14 DIAGNOSIS — N183 Chronic kidney disease, stage 3 unspecified: Secondary | ICD-10-CM | POA: Diagnosis not present

## 2022-11-14 DIAGNOSIS — I679 Cerebrovascular disease, unspecified: Secondary | ICD-10-CM | POA: Diagnosis not present

## 2022-11-14 DIAGNOSIS — R350 Frequency of micturition: Secondary | ICD-10-CM | POA: Diagnosis not present

## 2022-11-15 ENCOUNTER — Ambulatory Visit (HOSPITAL_COMMUNITY): Payer: PPO | Attending: Internal Medicine

## 2022-11-15 DIAGNOSIS — I517 Cardiomegaly: Secondary | ICD-10-CM | POA: Insufficient documentation

## 2022-11-15 DIAGNOSIS — R413 Other amnesia: Secondary | ICD-10-CM | POA: Diagnosis present

## 2022-11-15 DIAGNOSIS — I679 Cerebrovascular disease, unspecified: Secondary | ICD-10-CM | POA: Diagnosis not present

## 2022-11-15 DIAGNOSIS — I6389 Other cerebral infarction: Secondary | ICD-10-CM | POA: Diagnosis not present

## 2022-11-15 DIAGNOSIS — C44629 Squamous cell carcinoma of skin of left upper limb, including shoulder: Secondary | ICD-10-CM | POA: Diagnosis not present

## 2022-11-15 LAB — ECHOCARDIOGRAM COMPLETE BUBBLE STUDY
Area-P 1/2: 4.23 cm2
P 1/2 time: 440 msec
S' Lateral: 2.6 cm

## 2022-12-19 ENCOUNTER — Encounter: Payer: Self-pay | Admitting: Psychiatry

## 2022-12-19 ENCOUNTER — Ambulatory Visit (INDEPENDENT_AMBULATORY_CARE_PROVIDER_SITE_OTHER): Payer: PPO | Admitting: Psychiatry

## 2022-12-19 DIAGNOSIS — F331 Major depressive disorder, recurrent, moderate: Secondary | ICD-10-CM | POA: Diagnosis not present

## 2022-12-19 DIAGNOSIS — F411 Generalized anxiety disorder: Secondary | ICD-10-CM

## 2022-12-19 DIAGNOSIS — Z96651 Presence of right artificial knee joint: Secondary | ICD-10-CM | POA: Diagnosis not present

## 2022-12-19 MED ORDER — VILAZODONE HCL 10 MG PO TABS
10.0000 mg | ORAL_TABLET | Freq: Every day | ORAL | 1 refills | Status: DC
Start: 2022-12-19 — End: 2023-06-17

## 2022-12-19 MED ORDER — LITHIUM CARBONATE ER 450 MG PO TBCR
450.0000 mg | EXTENDED_RELEASE_TABLET | ORAL | 1 refills | Status: DC
Start: 2022-12-19 — End: 2023-04-23
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
## 2022-12-27 DIAGNOSIS — D225 Melanocytic nevi of trunk: Secondary | ICD-10-CM | POA: Diagnosis not present

## 2022-12-27 DIAGNOSIS — Z85828 Personal history of other malignant neoplasm of skin: Secondary | ICD-10-CM | POA: Diagnosis not present

## 2022-12-27 DIAGNOSIS — L570 Actinic keratosis: Secondary | ICD-10-CM | POA: Diagnosis not present

## 2022-12-27 DIAGNOSIS — D044 Carcinoma in situ of skin of scalp and neck: Secondary | ICD-10-CM | POA: Diagnosis not present

## 2022-12-27 DIAGNOSIS — Z85820 Personal history of malignant melanoma of skin: Secondary | ICD-10-CM | POA: Diagnosis not present

## 2022-12-27 DIAGNOSIS — X32XXXD Exposure to sunlight, subsequent encounter: Secondary | ICD-10-CM | POA: Diagnosis not present

## 2022-12-27 DIAGNOSIS — Z1283 Encounter for screening for malignant neoplasm of skin: Secondary | ICD-10-CM | POA: Diagnosis not present

## 2022-12-27 DIAGNOSIS — Z08 Encounter for follow-up examination after completed treatment for malignant neoplasm: Secondary | ICD-10-CM | POA: Diagnosis not present

## 2023-01-08 ENCOUNTER — Emergency Department (HOSPITAL_COMMUNITY): Payer: PPO

## 2023-01-08 ENCOUNTER — Encounter (HOSPITAL_COMMUNITY): Payer: Self-pay

## 2023-01-08 ENCOUNTER — Other Ambulatory Visit: Payer: Self-pay

## 2023-01-08 ENCOUNTER — Observation Stay (HOSPITAL_COMMUNITY)
Admission: EM | Admit: 2023-01-08 | Discharge: 2023-01-09 | Disposition: A | Discharge status: Home / Self Care | Payer: PPO | Source: Home / Self Care | Attending: Emergency Medicine | Admitting: Emergency Medicine

## 2023-01-08 DIAGNOSIS — S61411A Laceration without foreign body of right hand, initial encounter: Secondary | ICD-10-CM | POA: Insufficient documentation

## 2023-01-08 DIAGNOSIS — S199XXA Unspecified injury of neck, initial encounter: Secondary | ICD-10-CM | POA: Diagnosis not present

## 2023-01-08 DIAGNOSIS — S0083XA Contusion of other part of head, initial encounter: Secondary | ICD-10-CM | POA: Diagnosis not present

## 2023-01-08 DIAGNOSIS — I10 Essential (primary) hypertension: Secondary | ICD-10-CM | POA: Diagnosis not present

## 2023-01-08 DIAGNOSIS — I700 Atherosclerosis of aorta: Secondary | ICD-10-CM | POA: Insufficient documentation

## 2023-01-08 DIAGNOSIS — E039 Hypothyroidism, unspecified: Secondary | ICD-10-CM | POA: Diagnosis not present

## 2023-01-08 DIAGNOSIS — M1712 Unilateral primary osteoarthritis, left knee: Secondary | ICD-10-CM | POA: Diagnosis not present

## 2023-01-08 DIAGNOSIS — K5730 Diverticulosis of large intestine without perforation or abscess without bleeding: Secondary | ICD-10-CM | POA: Diagnosis not present

## 2023-01-08 DIAGNOSIS — S0101XA Laceration without foreign body of scalp, initial encounter: Secondary | ICD-10-CM | POA: Insufficient documentation

## 2023-01-08 DIAGNOSIS — S61210A Laceration without foreign body of right index finger without damage to nail, initial encounter: Secondary | ICD-10-CM | POA: Insufficient documentation

## 2023-01-08 DIAGNOSIS — Z79899 Other long term (current) drug therapy: Secondary | ICD-10-CM | POA: Insufficient documentation

## 2023-01-08 DIAGNOSIS — S61219A Laceration without foreign body of unspecified finger without damage to nail, initial encounter: Secondary | ICD-10-CM

## 2023-01-08 DIAGNOSIS — M85841 Other specified disorders of bone density and structure, right hand: Secondary | ICD-10-CM | POA: Diagnosis not present

## 2023-01-08 DIAGNOSIS — Z96651 Presence of right artificial knee joint: Secondary | ICD-10-CM | POA: Insufficient documentation

## 2023-01-08 DIAGNOSIS — S61011A Laceration without foreign body of right thumb without damage to nail, initial encounter: Secondary | ICD-10-CM | POA: Diagnosis not present

## 2023-01-08 DIAGNOSIS — S61212A Laceration without foreign body of right middle finger without damage to nail, initial encounter: Secondary | ICD-10-CM | POA: Insufficient documentation

## 2023-01-08 DIAGNOSIS — Z043 Encounter for examination and observation following other accident: Secondary | ICD-10-CM | POA: Diagnosis not present

## 2023-01-08 DIAGNOSIS — S0990XA Unspecified injury of head, initial encounter: Secondary | ICD-10-CM | POA: Diagnosis not present

## 2023-01-08 DIAGNOSIS — Z96642 Presence of left artificial hip joint: Secondary | ICD-10-CM | POA: Insufficient documentation

## 2023-01-08 DIAGNOSIS — S51012A Laceration without foreign body of left elbow, initial encounter: Secondary | ICD-10-CM | POA: Insufficient documentation

## 2023-01-08 DIAGNOSIS — T671XXA Heat syncope, initial encounter: Secondary | ICD-10-CM | POA: Diagnosis present

## 2023-01-08 DIAGNOSIS — S8991XA Unspecified injury of right lower leg, initial encounter: Secondary | ICD-10-CM | POA: Diagnosis not present

## 2023-01-08 DIAGNOSIS — S6991XA Unspecified injury of right wrist, hand and finger(s), initial encounter: Secondary | ICD-10-CM | POA: Diagnosis not present

## 2023-01-08 DIAGNOSIS — R55 Syncope and collapse: Secondary | ICD-10-CM | POA: Diagnosis not present

## 2023-01-08 DIAGNOSIS — Z85828 Personal history of other malignant neoplasm of skin: Secondary | ICD-10-CM | POA: Diagnosis not present

## 2023-01-08 DIAGNOSIS — S0993XA Unspecified injury of face, initial encounter: Secondary | ICD-10-CM | POA: Diagnosis not present

## 2023-01-08 DIAGNOSIS — S8992XA Unspecified injury of left lower leg, initial encounter: Secondary | ICD-10-CM | POA: Diagnosis not present

## 2023-01-08 LAB — CBC WITH DIFFERENTIAL/PLATELET
Abs Immature Granulocytes: 0.07 10*3/uL (ref 0.00–0.07)
Basophils Absolute: 0.1 10*3/uL (ref 0.0–0.1)
Basophils Relative: 1 %
Eosinophils Absolute: 0.2 10*3/uL (ref 0.0–0.5)
Eosinophils Relative: 2 %
HCT: 46.3 % (ref 39.0–52.0)
Hemoglobin: 15.1 g/dL (ref 13.0–17.0)
Immature Granulocytes: 1 %
Lymphocytes Relative: 12 %
Lymphs Abs: 1.5 10*3/uL (ref 0.7–4.0)
MCH: 30 pg (ref 26.0–34.0)
MCHC: 32.6 g/dL (ref 30.0–36.0)
MCV: 91.9 fL (ref 80.0–100.0)
Monocytes Absolute: 0.8 10*3/uL (ref 0.1–1.0)
Monocytes Relative: 7 %
Neutro Abs: 9.6 10*3/uL — ABNORMAL HIGH (ref 1.7–7.7)
Neutrophils Relative %: 77 %
Platelets: 232 10*3/uL (ref 150–400)
RBC: 5.04 MIL/uL (ref 4.22–5.81)
RDW: 14.9 % (ref 11.5–15.5)
WBC: 12.2 10*3/uL — ABNORMAL HIGH (ref 4.0–10.5)
nRBC: 0 % (ref 0.0–0.2)

## 2023-01-08 LAB — COMPREHENSIVE METABOLIC PANEL
ALT: 20 U/L (ref 0–44)
AST: 28 U/L (ref 15–41)
Albumin: 3.7 g/dL (ref 3.5–5.0)
Alkaline Phosphatase: 66 U/L (ref 38–126)
Anion gap: 11 (ref 5–15)
BUN: 21 mg/dL (ref 8–23)
CO2: 24 mmol/L (ref 22–32)
Calcium: 9.3 mg/dL (ref 8.9–10.3)
Chloride: 106 mmol/L (ref 98–111)
Creatinine, Ser: 1.32 mg/dL — ABNORMAL HIGH (ref 0.61–1.24)
GFR, Estimated: 55 mL/min — ABNORMAL LOW (ref >60)
Glucose, Bld: 129 mg/dL — ABNORMAL HIGH (ref 70–99)
Potassium: 4.2 mmol/L (ref 3.5–5.1)
Sodium: 141 mmol/L (ref 135–145)
Total Bilirubin: 0.5 mg/dL (ref 0.3–1.2)
Total Protein: 6.6 g/dL (ref 6.5–8.1)

## 2023-01-08 LAB — TROPONIN I (HIGH SENSITIVITY)
Troponin I (High Sensitivity): 10 ng/L (ref <18)
Troponin I (High Sensitivity): 14 ng/L (ref <18)

## 2023-01-08 MED ORDER — FENTANYL CITRATE PF 50 MCG/ML IJ SOSY
50.0000 ug | PREFILLED_SYRINGE | Freq: Once | INTRAMUSCULAR | Status: AC
  Administered 2023-01-08: 50 ug via INTRAVENOUS
  Filled 2023-01-08: qty 1

## 2023-01-08 MED ORDER — CEFAZOLIN SODIUM-DEXTROSE 2-4 GM/100ML-% IV SOLN
2.0000 g | Freq: Once | INTRAVENOUS | Status: AC
  Administered 2023-01-08: 2 g via INTRAVENOUS
  Filled 2023-01-08: qty 100

## 2023-01-08 MED ORDER — CEFAZOLIN SODIUM-DEXTROSE 1-4 GM/50ML-% IV SOLN
1.0000 g | Freq: Once | INTRAVENOUS | Status: DC
  Filled 2023-01-08: qty 50
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
## 2023-01-09 DIAGNOSIS — T671XXA Heat syncope, initial encounter: Secondary | ICD-10-CM | POA: Diagnosis present

## 2023-01-09 DIAGNOSIS — S61411A Laceration without foreign body of right hand, initial encounter: Secondary | ICD-10-CM | POA: Diagnosis not present

## 2023-01-09 LAB — BASIC METABOLIC PANEL
Anion gap: 9 (ref 5–15)
BUN: 18 mg/dL (ref 8–23)
CO2: 22 mmol/L (ref 22–32)
Calcium: 8.6 mg/dL — ABNORMAL LOW (ref 8.9–10.3)
Chloride: 104 mmol/L (ref 98–111)
Creatinine, Ser: 1.29 mg/dL — ABNORMAL HIGH (ref 0.61–1.24)
GFR, Estimated: 56 mL/min — ABNORMAL LOW (ref >60)
Glucose, Bld: 111 mg/dL — ABNORMAL HIGH (ref 70–99)
Potassium: 3.6 mmol/L (ref 3.5–5.1)
Sodium: 135 mmol/L (ref 135–145)

## 2023-01-09 LAB — CBC
HCT: 43.8 % (ref 39.0–52.0)
Hemoglobin: 14.4 g/dL (ref 13.0–17.0)
MCH: 30.1 pg (ref 26.0–34.0)
MCHC: 32.9 g/dL (ref 30.0–36.0)
MCV: 91.4 fL (ref 80.0–100.0)
Platelets: 215 10*3/uL (ref 150–400)
RBC: 4.79 MIL/uL (ref 4.22–5.81)
RDW: 14.7 % (ref 11.5–15.5)
WBC: 13.1 10*3/uL — ABNORMAL HIGH (ref 4.0–10.5)
nRBC: 0 % (ref 0.0–0.2)

## 2023-01-09 MED ORDER — ACETAMINOPHEN 325 MG PO TABS: 650.0000 mg | ORAL_TABLET | Freq: Four times a day (QID) | ORAL | Status: DC | PRN

## 2023-01-09 MED ORDER — CEPHALEXIN 500 MG PO CAPS
500.0000 mg | ORAL_CAPSULE | Freq: Two times a day (BID) | ORAL | Status: DC
  Administered 2023-01-09: 500 mg via ORAL
  Filled 2023-01-09: qty 1

## 2023-01-09 MED ORDER — ONDANSETRON HCL 4 MG/2ML IJ SOLN: 4.0000 mg | Freq: Four times a day (QID) | INTRAMUSCULAR | Status: DC | PRN

## 2023-01-09 MED ORDER — ONDANSETRON HCL 4 MG PO TABS: 4.0000 mg | ORAL_TABLET | Freq: Four times a day (QID) | ORAL | Status: DC | PRN

## 2023-01-09 MED ORDER — SODIUM CHLORIDE 0.9% FLUSH
3.0000 mL | Freq: Two times a day (BID) | INTRAVENOUS | Status: DC
  Administered 2023-01-09 (×2): 3 mL via INTRAVENOUS

## 2023-01-09 MED ORDER — ACETAMINOPHEN 650 MG RE SUPP: 650.0000 mg | Freq: Four times a day (QID) | RECTAL | Status: DC | PRN

## 2023-01-09 MED ORDER — CEPHALEXIN 500 MG PO CAPS: 500.0000 mg | ORAL_CAPSULE | Freq: Four times a day (QID) | ORAL | 0 refills | Status: AC

## 2023-01-09 NOTE — H&P (Signed)
History and Physical    Patient: Jake Collins NGE:[id number] DOB: [birth year] DOA: 01/08/2023 DOS: the patient was seen and examined on 01/09/2023 PCP: [name], L.August Saucer, MD  Patient coming from: Home  Chief Complaint:  Chief Complaint  Patient presents with   Fall   HPI: Jake Collins is a 77 y.o. 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 Marjory Lies as outpt.  Pt normally takes his garbage down his half mile long driveway by tractor.  But tractor not working today.  As a result pt decided to try and manually cary his heavy trash can down the half mile long driveway "in the 95 degree heat outside".  While doing this pt became sweaty, light headed, and passed out.  In to ED with laceration to his right hand patient complains of injury to his left elbow and his head.  Hand lacs sutured by EDP.  Extensive imaging performed in ED but ultimately no bony fractures, acute intracranial injury, etc was detected.  EDP asking for admit for syncope work up.  Review of Systems: As mentioned in the history of present illness. All other systems reviewed and are negative. Past Medical History:  Diagnosis Date   Arthritis    Cancer (HCC)    skin melanoma   Depression    Diverticulitis    2 bouts in 10 years   Hyperlipemia    Hypertension    Hypothyroid    Scoliosis    Thyroid disease    Past Surgical History:  Procedure Laterality Date   APPENDECTOMY     CATARACT EXTRACTION W/ INTRAOCULAR LENS IMPLANT Bilateral    COLONOSCOPY     ESOPHAGOGASTRODUODENOSCOPY N/A 07/12/2022   Procedure: ESOPHAGOGASTRODUODENOSCOPY (EGD);  Surgeon: Vida Rigger, MD;  Location: [facility];  Service: Gastroenterology;  Laterality: N/A;   EYE SURGERY     HERNIA REPAIR     KNEE ARTHROSCOPY Right     x2   KNEE SURGERY Left    LAPAROSCOPIC APPENDECTOMY  05/28/2011   Procedure: APPENDECTOMY LAPAROSCOPIC;  Surgeon: Mariella Saa,  MD;  Location: [facility];  Service: General;  Laterality: N/A;   PAROTID GLAND TUMOR EXCISION     ROOT CANAL     ROTATOR CUFF REPAIR     bil    SALIVARY GLAND SURGERY     L side   SKIN CANCER EXCISION     on his back and was benign   TOTAL HIP ARTHROPLASTY Left 04/14/2019   Procedure: TOTAL HIP ARTHROPLASTY ANTERIOR APPROACH;  Surgeon: Ollen Gross, MD;  Location: [facility];  Service: Orthopedics;  Laterality: Left;    TOTAL KNEE ARTHROPLASTY Right 07/08/2022   Procedure: TOTAL KNEE ARTHROPLASTY;  Surgeon: Ollen Gross, MD;  Location: [facility];  Service: Orthopedics;  Laterality: Right;   Social History:  reports that he has never smoked. He has never used smokeless tobacco. He reports that he does not drink alcohol and does not use drugs.  Allergies  Allergen Reactions   Sulfa Antibiotics     Trudie Buckler syndrome     Family History  Problem Relation Age of Onset   Heart disease Father     Prior to Admission medications   Medication Sig Start Date End Date Taking? Authorizing Provider  cephALEXin (KEFLEX) 500 MG capsule Take 1 capsule (500 mg total) by mouth 4 (four) times daily for 10 days. 01/09/23 01/19/23 Yes Elson Areas, PA-C  acetaminophen (TYLENOL) 500  MG tablet Take 2 tablets (1,000 mg total) by mouth every 8 (eight) hours as needed. 07/09/22   Cory Munch, PA-C  Acetylcysteine (NAC 600 PO) Take 1 tablet by mouth daily.    [provider]  glucosamine-chondroitin 500-400 MG tablet Take 1 tablet by mouth daily.    [provider]  latanoprost (XALATAN) 0.005 % ophthalmic solution Place 1 drop into both eyes at bedtime.    [provider]  levothyroxine (SYNTHROID, LEVOTHROID) 100 MCG tablet Take 100 mcg by mouth daily.      [provider]  lithium carbonate (ESKALITH) 450 MG ER tablet Take 1 tablet (450 mg total) by mouth every other day. 12/19/22   Cottle, Steva Ready., MD  loratadine (CLARITIN) 10 MG tablet Take 10 mg  by mouth daily as needed for allergies.    [provider]  Melatonin 5 MG CHEW Chew 10 mg by mouth at bedtime. Gummy tabs    [provider]  memantine (NAMENDA) 10 MG tablet TAKE 1 TABLET BY MOUTH TWICE A DAY 11/11/22   Penumalli, Glenford Bayley, MD  Multiple Vitamins-Minerals (CENTRUM SILVER PO) Take 1 tablet by mouth daily.    [provider]  nebivolol (BYSTOLIC) 5 MG tablet Take 5 mg by mouth daily.    [provider]  pantoprazole (PROTONIX) 40 MG tablet Take 40 mg by mouth daily.    [provider]  REPATHA SURECLICK 140 MG/ML SOAJ Inject 140 mg into the skin every 14 (fourteen) days. 01/24/20   [provider]  silodosin (RAPAFLO) 8 MG CAPS capsule Take 1 capsule (8 mg total) by mouth daily with breakfast. 07/16/22   Standley Brooking, MD  Vilazodone HCl (VIIBRYD) 10 MG TABS Take 1 tablet (10 mg total) by mouth daily. 12/19/22   Cottle, Steva Ready., MD    Physical Exam: Vitals:   01/08/23 2020 01/08/23 2230 01/08/23 2330 01/09/23 0003  BP: (!) 173/73 (!) 184/68 (!) 163/82   Pulse: 62 68 (!) 59   Resp: 14 19 14    Temp:    98 F (36.7 C)  TempSrc:    Oral  SpO2: 95% 98% 100%    Constitutional: NAD, calm, comfortable Respiratory: clear to auscultation bilaterally, no wheezing, no crackles. Normal respiratory effort. No accessory muscle use.  Cardiovascular: Regular rate and rhythm, no murmurs / rubs / gallops. No extremity edema. 2+ pedal pulses. No carotid bruits.  Abdomen: no tenderness, no masses palpated. No hepatosplenomegaly. Bowel sounds positive.  Musculoskeletal: no clubbing / cyanosis. No joint deformity upper and lower extremities. Good ROM, no contractures. Normal muscle tone.  Skin:  ecchymosis to L face and around L eye, Laceration right elbow, skin tear 1 cm laceration right thumb dorsal aspect 2 cm laceration right index finger dorsal aspect 2cm laceration right middle finger dorsal 1.2 cm laceration right 4th finger   Patient has full range of motion of all fingers neurovascular neurosensory are intact Neurologic: CN 2-12 grossly intact. Sensation intact, DTR normal. Strength 5/5 in all 4.  Psychiatric: Normal judgment and insight. Alert and oriented x 3. Normal mood.   Data Reviewed:    Labs on Admission: I have personally reviewed following labs and imaging studies  CBC: Recent Labs  Lab 01/08/23 2017  WBC 12.2*  NEUTROABS 9.6*  HGB 15.1  HCT 46.3  MCV 91.9  PLT 232   Basic Metabolic Panel: Recent Labs  Lab 01/08/23 2017  NA 141  K 4.2  CL 106  CO2 24  GLUCOSE 129*  BUN 21  CREATININE 1.32*  CALCIUM 9.3   GFR: CrCl cannot be calculated (Unknown ideal weight.). Liver Function Tests: Recent Labs  Lab 01/08/23 2017  AST 28  ALT 20  ALKPHOS 66  BILITOT 0.5  PROT 6.6  ALBUMIN 3.7   No results for input(s): "LIPASE", "AMYLASE" in the last 168 hours. No results for input(s): "AMMONIA" in the last 168 hours. Coagulation Profile: No results for input(s): "INR", "PROTIME" in the last 168 hours. Cardiac Enzymes: No results for input(s): "CKTOTAL", "CKMB", "CKMBINDEX", "TROPONINI" in the last 168 hours. BNP (last 3 results) No results for input(s): "PROBNP" in the last 8760 hours. HbA1C: No results for input(s): "HGBA1C" in the last 72 hours. CBG: No results for input(s): "GLUCAP" in the last 168 hours. Lipid Profile: No results for input(s): "CHOL", "HDL", "LDLCALC", "TRIG", "CHOLHDL", "LDLDIRECT" in the last 72 hours. Thyroid Function Tests: No results for input(s): "TSH", "T4TOTAL", "FREET4", "T3FREE", "THYROIDAB" in the last 72 hours. Anemia Panel: No results for input(s): "VITAMINB12", "FOLATE", "FERRITIN", "TIBC", "IRON", "RETICCTPCT" in the last 72 hours. Urine analysis:    Component Value Date/Time   COLORURINE YELLOW 07/11/2022 2249   APPEARANCEUR CLEAR 07/11/2022 2249   LABSPEC 1.012 07/11/2022 2249   PHURINE 6.0 07/11/2022 2249   GLUCOSEU NEGATIVE  07/11/2022 2249   HGBUR LARGE (A) 07/11/2022 2249   BILIRUBINUR NEGATIVE 07/11/2022 2249   KETONESUR NEGATIVE 07/11/2022 2249   PROTEINUR 30 (A) 07/11/2022 2249   UROBILINOGEN 1.0 05/28/2011 1635   NITRITE NEGATIVE 07/11/2022 2249   LEUKOCYTESUR NEGATIVE 07/11/2022 2249    Radiological Exams on Admission: CT CHEST ABDOMEN PELVIS WO CONTRAST  Result Date: 01/08/2023 CLINICAL DATA:  Larey Seat off tractor EXAM: CT CHEST, ABDOMEN AND PELVIS WITHOUT CONTRAST TECHNIQUE: Multidetector CT imaging of the chest, abdomen and pelvis was performed following the standard protocol without IV contrast. Contrast was not administered at the request of the ordering clinician, and evaluation of the soft tissues, vascular structures, and solid viscera is severely limited in the setting of trauma. RADIATION DOSE REDUCTION: This exam was performed according to the departmental dose-optimization program which includes automated exposure control, adjustment of the mA and/or kV according to patient size and/or use of iterative reconstruction technique. COMPARISON:  05/28/2011 FINDINGS: CT CHEST FINDINGS Cardiovascular: Limited unenhanced imaging of the heart and great vessels is unremarkable. No pericardial effusion. Atherosclerosis of the aorta and coronary vasculature. Mediastinum/Nodes: Thyroid and trachea are unremarkable. Fluid-filled esophagus is identified, which could reflect sequela of reflux. No pathologic adenopathy. Lungs/Pleura: No acute airspace disease, effusion, or pneumothorax. Dependent hypoventilatory changes are seen within the lungs. Central airways are patent. Musculoskeletal: No acute or destructive bony abnormalities. Reconstructed images demonstrate no additional findings. CT ABDOMEN PELVIS FINDINGS Hepatobiliary: Unremarkable unenhanced appearance of the liver and gallbladder. Pancreas: Unremarkable unenhanced appearance. Spleen: Unremarkable unenhanced appearance. Adrenals/Urinary Tract: No urinary tract  calculi or obstructive uropathy within either kidney. Unremarkable unenhanced appearance of the renal parenchyma. The adrenals and bladder are unremarkable. Stomach/Bowel: No bowel obstruction or ileus. Diverticulosis of the sigmoid colon without evidence of acute diverticulitis. Vascular/Lymphatic: Atherosclerosis of the abdominal aorta. Evaluation of the vascular lumen is limited without IV contrast. No pathologic adenopathy. Reproductive: Prostate is enlarged measuring 5.7 x 4.8 cm. Other: No free fluid or free intraperitoneal gas. Fat containing left inguinal hernia. No bowel herniation. Musculoskeletal: Left hip arthroplasty. No acute displaced fractures. Reconstructed images demonstrate no additional findings. IMPRESSION: 1. Limited evaluation without IV contrast in the setting of trauma.  2. No acute intrathoracic, intra-abdominal, or intrapelvic trauma. 3. Fluid-filled esophagus could reflect sequela of gastroesophageal reflux. No evidence of esophagitis. 4. Sigmoid diverticulosis without diverticulitis. 5. Fat containing left inguinal hernia.  No bowel herniation. 6.  Aortic Atherosclerosis (ICD10-I70.0). Electronically Signed   By: Sharlet Salina M.D.   On: 01/08/2023 22:06   DG Knee Complete 4 Views Left  Result Date: 01/08/2023 CLINICAL DATA:  Injury EXAM: LEFT KNEE - COMPLETE 4+ VIEW COMPARISON:  None Available. FINDINGS: No fracture or malalignment. Mild tricompartment arthritis of the knee. Vascular calcifications. No sizable effusion IMPRESSION: No acute osseous abnormality Electronically Signed   By: Jasmine Pang M.D.   On: 01/08/2023 21:35   DG Hand Complete Right  Result Date: 01/08/2023 CLINICAL DATA:  Trauma to the right hand. EXAM: RIGHT HAND - COMPLETE 3+ VIEW COMPARISON:  None Available. FINDINGS: There is no acute fracture or dislocation. The bones are osteopenic. No significant arthritic changes. Laceration of the soft tissues of the dorsum of the 2nd-4th digits. Tiny radiopaque  foci over the skin of the third digit, likely gravel. IMPRESSION: 1. No acute fracture or dislocation. 2. Soft tissue laceration. Electronically Signed   By: Elgie Collard M.D.   On: 01/08/2023 21:34   DG Knee Complete 4 Views Right  Result Date: 01/08/2023 CLINICAL DATA:  Injury EXAM: RIGHT KNEE - COMPLETE 4+ VIEW COMPARISON:  None Available. FINDINGS: Knee replacement with intact hardware and normal alignment. No definitive fracture. No sizable effusion. Vascular calcifications IMPRESSION: Knee replacement. No acute osseous abnormality. Electronically Signed   By: Jasmine Pang M.D.   On: 01/08/2023 21:34   CT Head Wo Contrast  Result Date: 01/08/2023 CLINICAL DATA:  Fall with trauma to the head, face and neck. EXAM: CT HEAD WITHOUT CONTRAST CT MAXILLOFACIAL WITHOUT CONTRAST CT CERVICAL SPINE WITHOUT CONTRAST TECHNIQUE: Multidetector CT imaging of the head, cervical spine, and maxillofacial structures were performed using the standard protocol without intravenous contrast. Multiplanar CT image reconstructions of the cervical spine and maxillofacial structures were also generated. RADIATION DOSE REDUCTION: This exam was performed according to the departmental dose-optimization program which includes automated exposure control, adjustment of the mA and/or kV according to patient size and/or use of iterative reconstruction technique. COMPARISON:  09/29/2022 FINDINGS: CT HEAD FINDINGS Brain: No acute intracranial finding. Chronic small-vessel ischemic changes of the white matter. 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 No sign of acute infarction, mass, hemorrhage, hydrocephalus or extra-axial collection. Vascular: There is atherosclerotic calcification of the major vessels at the base of the brain. Skull: Negative Other: None CT MAXILLOFACIAL FINDINGS Osseous: No facial fracture. Orbits: No orbital injury. Supraorbital soft tissue  hematoma on the left. Sinuses: Clear. Soft tissues: Supraorbital soft tissue hematoma on the left. CT CERVICAL SPINE FINDINGS Alignment: Chronic degenerative anterolisthesis at C7-T1 of 3-4 mm. Skull base and vertebrae: No fracture or focal lesion. Soft tissues and spinal canal: No soft tissue injury. Disc levels: Mild spondylosis at C6-7 as above. No significant stenosis. At C7-T1, there is chronic facet arthropathy with 3-4 mm of anterolisthesis. No compressive canal stenosis. Mild foraminal narrowing. Upper chest: Negative Other: None IMPRESSION: Head CT: No acute or traumatic finding.  Old ischemic changes. CT maxillofacial: No fracture. Left supraorbital soft tissue swelling. CT cervical spine: No traumatic finding. Chronic facet arthropathy with degenerative anterolisthesis C7-T1. Electronically Signed   By: Paulina Fusi M.D.   On: 01/08/2023 21:13   CT Maxillofacial Wo  Contrast  Result Date: 01/08/2023 CLINICAL DATA:  Fall with trauma to the head, face and neck. EXAM: CT HEAD WITHOUT CONTRAST CT MAXILLOFACIAL WITHOUT CONTRAST CT CERVICAL SPINE WITHOUT CONTRAST TECHNIQUE: Multidetector CT imaging of the head, cervical spine, and maxillofacial structures were performed using the standard protocol without intravenous contrast. Multiplanar CT image reconstructions of the cervical spine and maxillofacial structures were also generated. RADIATION DOSE REDUCTION: This exam was performed according to the departmental dose-optimization program which includes automated exposure control, adjustment of the mA and/or kV according to patient size and/or use of iterative reconstruction technique. COMPARISON:  09/29/2022 FINDINGS: CT HEAD FINDINGS Brain: No acute intracranial finding. Chronic small-vessel ischemic changes of the white matter. 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 No sign of acute infarction, mass, hemorrhage, hydrocephalus or  extra-axial collection. Vascular: There is atherosclerotic calcification of the major vessels at the base of the brain. Skull: Negative Other: None CT MAXILLOFACIAL FINDINGS Osseous: No facial fracture. Orbits: No orbital injury. Supraorbital soft tissue hematoma on the left. Sinuses: Clear. Soft tissues: Supraorbital soft tissue hematoma on the left. CT CERVICAL SPINE FINDINGS Alignment: Chronic degenerative anterolisthesis at C7-T1 of 3-4 mm. Skull base and vertebrae: No fracture or focal lesion. Soft tissues and spinal canal: No soft tissue injury. Disc levels: Mild spondylosis at C6-7 as above. No significant stenosis. At C7-T1, there is chronic facet arthropathy with 3-4 mm of anterolisthesis. No compressive canal stenosis. Mild foraminal narrowing. Upper chest: Negative Other: None IMPRESSION: Head CT: No acute or traumatic finding.  Old ischemic changes. CT maxillofacial: No fracture. Left supraorbital soft tissue swelling. CT cervical spine: No traumatic finding. Chronic facet arthropathy with degenerative anterolisthesis C7-T1. Electronically Signed   By: Paulina Fusi M.D.   On: 01/08/2023 21:13   CT Cervical Spine Wo Contrast  Result Date: 01/08/2023 CLINICAL DATA:  Fall with trauma to the head, face and neck. EXAM: CT HEAD WITHOUT CONTRAST CT MAXILLOFACIAL WITHOUT CONTRAST CT CERVICAL SPINE WITHOUT CONTRAST TECHNIQUE: Multidetector CT imaging of the head, cervical spine, and maxillofacial structures were performed using the standard protocol without intravenous contrast. Multiplanar CT image reconstructions of the cervical spine and maxillofacial structures were also generated. RADIATION DOSE REDUCTION: This exam was performed according to the departmental dose-optimization program which includes automated exposure control, adjustment of the mA and/or kV according to patient size and/or use of iterative reconstruction technique. COMPARISON:  09/29/2022 FINDINGS: CT HEAD FINDINGS Brain: No acute  intracranial finding. Chronic small-vessel ischemic changes of the white matter. 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 No sign of acute infarction, mass, hemorrhage, hydrocephalus or extra-axial collection. Vascular: There is atherosclerotic calcification of the major vessels at the base of the brain. Skull: Negative Other: None CT MAXILLOFACIAL FINDINGS Osseous: No facial fracture. Orbits: No orbital injury. Supraorbital soft tissue hematoma on the left. Sinuses: Clear. Soft tissues: Supraorbital soft tissue hematoma on the left. CT CERVICAL SPINE FINDINGS Alignment: Chronic degenerative anterolisthesis at C7-T1 of 3-4 mm. Skull base and vertebrae: No fracture or focal lesion. Soft tissues and spinal canal: No soft tissue injury. Disc levels: Mild spondylosis at C6-7 as above. No significant stenosis. At C7-T1, there is chronic facet arthropathy with 3-4 mm of anterolisthesis. No compressive canal stenosis. Mild foraminal narrowing. Upper chest: Negative Other: None IMPRESSION: Head CT: No acute or traumatic finding.  Old ischemic changes. CT maxillofacial: No fracture.  Left supraorbital soft tissue swelling. CT cervical spine: No traumatic finding. Chronic facet arthropathy with degenerative anterolisthesis C7-T1. Electronically Signed   By: Paulina Fusi M.D.   On: 01/08/2023 21:13    EKG: Independently reviewed.   Assessment and Plan: * Heat syncope, initial encounter HPI highly suspicious for heat syncope, temperature at 7pm in [place] was ~90 F (according to thermometer on my car coming in to work today).  Pt himself agrees that this is most likely what happened. Additionally reassuring is: Pt scoring a 0/5 on the SFSR Pt just had a 2d echo less than 60 days ago (11/15/22, confirmed by pt and visible under procedures tab in chart review) which wasn't particularly impressive for findings to suggest any major cardiac disease  (had mild LVH and trivial AR with no AS, normal EF).  Plan: Since his wife already went home for the night (no ride), will put pt in for overnight obs Syncope pathway Tele monitor Repeat CBC/BMP in AM As long as no arrhythmias documented / no further medical events occur overnight however; feel he's likely reasonable to be discharged in AM  Laceration of right hand Wound sutured by EDP. Per EDP: 5-7 days of prophylactic ABx (got ancef in ED, normally theyd put pt on Keflex), and follow up with Hand surgeon in office (pt sees Dr. Merlyn Lot). Ordering BID Keflex for prophylaxis Wound care / dressing changes on floor Needs f/u with hand surg as recd by EDP vs day team may want to have Dr. Merlyn Lot drop by and look at hand in hospital prior to DC in AM      Advance Care Planning:   Code Status: Full Code  Consults: None  Family Communication: no family in room  Severity of Illness: The appropriate patient status for this patient is OBSERVATION. Observation status is judged to be reasonable and necessary in order to provide the required intensity of service to ensure the patient's safety. The patient's presenting symptoms, physical exam findings, and initial radiographic and laboratory data in the context of their medical condition is felt to place them at decreased risk for further clinical deterioration. Furthermore, it is anticipated that the patient will be medically stable for discharge from the hospital within 2 midnights of admission.   Author: Hillary Bow., DO 01/09/2023 1:33 AM  For on call review [email or web address].

## 2023-01-09 NOTE — TOC Transition Note (Signed)
Transition of Care [facility]) - CM/SW Discharge Note   Patient Details  Name: Jake Collins MRN: [id number] Date of Birth: July 06, 1945  [facility] CM/SW Contact:  Kermit Balo, RN Phone Number: 01/09/2023, 12:17 PM   Clinical Narrative:    No f/u per PT. Pt has walker at home.  Wife manages his medications and they deny any issues. Both drive but wife can provide transportation for now. Wife asking to have referral to Dr Rica Mast the hand MD. CM sent message to admitting MD. Wife will transport home.   Final next level of care: Home/Self Care Barriers to Discharge: No Barriers Identified   Patient Goals and CMS Choice      Discharge Placement                         Discharge Plan and Services Additional resources added to the After Visit Summary for                                       Social Determinants of Health (SDOH) Interventions SDOH Screenings   Food Insecurity: No Food Insecurity (01/09/2023)  Housing: Low Risk  (01/09/2023)  Transportation Needs: No Transportation Needs (01/09/2023)  Utilities: Not At Risk (01/09/2023)  Tobacco Use: Low Risk  (01/08/2023)     Readmission Risk Interventions     No data to display

## 2023-01-09 NOTE — Assessment & Plan Note (Addendum)
Wound sutured by EDP. Per EDP: 5-7 days of prophylactic ABx (got ancef in ED, normally theyd put pt on Keflex), and follow up with Hand surgeon in office (pt sees Dr. Merlyn Lot). Ordering BID Keflex for prophylaxis Wound care / dressing changes on floor Needs f/u with hand surg as recd by EDP vs day team may want to have Dr. Merlyn Lot drop by and look at hand in hospital prior to DC in AM

## 2023-01-09 NOTE — Discharge Summary (Signed)
Physician Discharge Summary  Patient ID: Jake Collins MRN: [id number] DOB/AGE: Dec 28, 1945 77 y.o.  Admit date: 01/08/2023 Discharge date: 01/09/2023  Admission Diagnoses:  Discharge Diagnoses:  Principal Problem:   Heat syncope, initial encounter Active Problems:   Laceration of right hand   Discharged Condition: stable  Hospital Course: Patient is a 77 year old Caucasian male with history of hypertension, hyperlipidemia, small vessel CVD and memory loss.  Patient was admitted with syncope after exposure to strenuous exercise and excessive heat.  Patient is back to baseline.  Imaging studies came back negative.  Patient had injuries to the hand following the syncope.  Patient will follow-up with PCP and orthopedic surgery on discharge.  Patient is eager to be discharged back home.  Patient insists on being discharged back on.  PT OT assisted with patient's assessment and management during the hospital stay.  Laceration of right hand: -Follow-up with Dr. Hulda Humphrey, orthopedic surgeon on discharge. -Continue Keflex.  Consults: None  Significant Diagnostic Studies:  CT HEAD WITHOUT CONTRAST   CT MAXILLOFACIAL WITHOUT CONTRAST   CT CERVICAL SPINE WITHOUT CONTRAST   TECHNIQUE: Multidetector CT imaging of the head, cervical spine, and maxillofacial structures were performed using the standard protocol without intravenous contrast. Multiplanar CT image reconstructions of the cervical spine and maxillofacial structures were also generated.   RADIATION DOSE REDUCTION: This exam was performed according to the departmental dose-optimization program which includes automated exposure control, adjustment of the mA and/or kV according to patient size and/or use of iterative reconstruction technique.   COMPARISON:  09/29/2022   FINDINGS: CT HEAD FINDINGS   Brain: No acute intracranial finding. Chronic small-vessel ischemic changes of the white matter. 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 No sign of acute infarction, mass, hemorrhage, hydrocephalus or extra-axial collection.   Vascular: There is atherosclerotic calcification of the major vessels at the base of the brain.   Skull: Negative   Other: None   CT MAXILLOFACIAL FINDINGS   Osseous: No facial fracture.   Orbits: No orbital injury. Supraorbital soft tissue hematoma on the left.   Sinuses: Clear.   Soft tissues: Supraorbital soft tissue hematoma on the left.   CT CERVICAL SPINE FINDINGS   Alignment: Chronic degenerative anterolisthesis at C7-T1 of 3-4 mm.   Skull base and vertebrae: No fracture or focal lesion.   Soft tissues and spinal canal: No soft tissue injury.   Disc levels: Mild spondylosis at C6-7 as above. No significant stenosis. At C7-T1, there is chronic facet arthropathy with 3-4 mm of anterolisthesis. No compressive canal stenosis. Mild foraminal narrowing.   Upper chest: Negative   Other: None   IMPRESSION: Head CT: No acute or traumatic finding.  Old ischemic changes.   CT maxillofacial: No fracture. Left supraorbital soft tissue swelling.   CT cervical spine: No traumatic finding. Chronic facet arthropathy with degenerative anterolisthesis C7-T1.     Electronically Signed   By: Paulina Fusi M.D.   On: 01/08/2023 21:13    Discharge Exam: Blood pressure (!) 140/70, pulse 66, temperature 98.6 F (37 C), temperature source Oral, resp. rate 18, height 5\' 10"  (1.778 m), SpO2 95%.   Disposition: Discharge disposition: 01-Home or Self Care       Discharge Instructions     Diet - low sodium heart healthy   Complete by: As directed    Discharge wound care:   Complete by: As directed    Continue current wound  care regimen.   Increase activity slowly   Complete by: As directed       Allergies as of 01/09/2023       Reactions   Atorvastatin    Other Reaction(s): Aches and  memory   Rosuvastatin    Other Reaction(s): Aches and memory   Sulfa Antibiotics    Trudie Buckler syndrome         Medication List     TAKE these medications    acetaminophen 500 MG tablet Commonly known as: TYLENOL Take 2 tablets (1,000 mg total) by mouth every 8 (eight) hours as needed. What changed: reasons to take this   CENTRUM SILVER PO Take 1 tablet by mouth daily.   cephALEXin 500 MG capsule Commonly known as: KEFLEX Take 1 capsule (500 mg total) by mouth 4 (four) times daily for 10 days.   glucosamine-chondroitin 500-400 MG tablet Take 1 tablet by mouth daily.   latanoprost 0.005 % ophthalmic solution Commonly known as: XALATAN Place 1 drop into both eyes at bedtime.   levothyroxine 100 MCG tablet Commonly known as: SYNTHROID Take 100 mcg by mouth daily.   lithium carbonate 450 MG ER tablet Commonly known as: ESKALITH Take 1 tablet (450 mg total) by mouth every other day.   loratadine 10 MG tablet Commonly known as: CLARITIN Take 10 mg by mouth daily as needed for allergies.   Melatonin 5 MG Chew Chew 10 mg by mouth at bedtime. Gummy tabs   memantine 10 MG tablet Commonly known as: NAMENDA TAKE 1 TABLET BY MOUTH TWICE A DAY   NAC 600 PO Take 1 tablet by mouth daily.   nebivolol 5 MG tablet Commonly known as: BYSTOLIC Take 5 mg by mouth daily.   pantoprazole 40 MG tablet Commonly known as: PROTONIX Take 40 mg by mouth daily.   Repatha SureClick 140 MG/ML Soaj Generic drug: Evolocumab Inject 140 mg into the skin every 14 (fourteen) days. Every other Saturday   silodosin 8 MG Caps capsule Commonly known as: RAPAFLO Take 1 capsule (8 mg total) by mouth daily with breakfast.   Vilazodone HCl 10 MG Tabs Commonly known as: VIIBRYD Take 1 tablet (10 mg total) by mouth daily.               Discharge Care Instructions  (From admission, onward)           Start     Ordered   01/09/23 0000  Discharge wound care:       Comments:  Continue current wound care regimen.   01/09/23 1417            Follow-up Information     Cindee Salt, MD.   Specialty: Orthopedic Surgery Contact information: [address] [place] [address] [phone]         Marlyne Beards, MD.   Specialty: Orthopedic Surgery Why: Follow up with Hand surgery for recheck and suture removal in 8 days Contact information: [address] [place] [address] [phone]                 Time spent: 35 minutes.  SignedBarnetta Chapel 01/09/2023, 2:17 PM

## 2023-01-09 NOTE — Evaluation (Signed)
Occupational Therapy Evaluation Patient Details Name: Jake Collins MRN: [id number] DOB: [birth year] Today's Date: 01/09/2023   History of Present Illness Jake Collins is a 77 y.o. male admitted 7/31 after a fall/syncopal episode when taking out the trash down his driveway  Pt with multiple lacerations/brusing on face, UEs and R digits/hand though no acute fx.  PMH:  HTN, HLD, small vessel CVD causing some memory loss   Clinical Impression   PTA, pt lives with spouse, ambulatory without AD and Independent with ADLs. Wife assists with some IADLs (med mgmt, per chart). Pt denies any other recent falls. Pt presents now with minor deficits in standing balance and baseline cognitive deficits (though unsure how far from cognitive baseline pt is now). Pt able to mobilize without AD and min guard for safety due to imbalance. Pt able to self correct LOB. Pt requires Min-Mod A for ADLs due to impaired use of R nondominant UE (splinted/bandaged) w/ cues needed for problem solving compensatory strategies. Educated pt re: having spouse assist for ADLs at home, safety considerations with mobility and use of cane if imbalance continues (unable to use RW with current R hand bandage). Discussed with PT, pt declined HHPT follow up at DC when discussed during PT eval.       Recommendations for follow up therapy are one component of a multi-disciplinary discharge planning process, led by the attending physician.  Recommendations may be updated based on patient status, additional functional criteria and insurance authorization.   Assistance Recommended at Discharge Intermittent Supervision/Assistance  Patient can return home with the following A little help with bathing/dressing/bathroom;Assistance with cooking/housework;Direct supervision/assist for medications management;Direct supervision/assist for financial management;Assist for transportation;Help with stairs or ramp for entrance    Functional Status  Assessment  Patient has had a recent decline in their functional status and demonstrates the ability to make significant improvements in function in a reasonable and predictable amount of time.  Equipment Recommendations  None recommended by OT    Recommendations for Other Services       Precautions / Restrictions Precautions Precautions: Fall Restrictions Weight Bearing Restrictions: No      Mobility Bed Mobility Overal bed mobility: Independent                  Transfers Overall transfer level: Needs assistance Equipment used: None Transfers: Sit to/from Stand Sit to Stand: Supervision           General transfer comment: as pt sitting too close to EOB; supervision for safety      Balance Overall balance assessment: Needs assistance Sitting-balance support: Single extremity supported, No upper extremity supported, Feet supported Sitting balance-Leahy Scale: Fair     Standing balance support: No upper extremity supported, During functional activity Standing balance-Leahy Scale: Fair                             ADL either performed or assessed with clinical judgement   ADL Overall ADL's : Needs assistance/impaired Eating/Feeding: Set up   Grooming: Supervision/safety;Standing;Oral care Grooming Details (indicate cue type and reason): cues/demonstration for compensatory one handed techniques for oral care w/ pt noted difficulty problem solving Upper Body Bathing: Minimal assistance   Lower Body Bathing: Minimal assistance;Sit to/from stand   Upper Body Dressing : Minimal assistance   Lower Body Dressing: Moderate assistance;Sitting/lateral leans;Sit to/from stand   Toilet Transfer: Min guard   Toileting- Architect and Hygiene: Supervision/safety;Sitting/lateral lean;Sit to/from stand Toileting -  Clothing Manipulation Details (indicate cue type and reason): had completed prior to OT entry without assist     Functional  mobility during ADLs: Min guard General ADL Comments: discussed easier clothing to manage at home, safe bathing strategies (pt independently suggesting wrapping R hand in plastic bag to avoid getting bandages wet), discussed use of cane if imbalance persist at home. pt with some unsteadiness mobilizing in room but able to selfcorrect. had exited bathroom independently as MD sitting at bedside on entry (and OT discussed pt in bathroom independent prior to OT entry)     Vision Ability to See in Adequate Light: 0 Adequate Patient Visual Report: No change from baseline Vision Assessment?: No apparent visual deficits     Perception     Praxis      Pertinent Vitals/Pain Pain Assessment Pain Assessment: Faces Faces Pain Scale: Hurts little more Pain Location: R hand Pain Descriptors / Indicators: Sore Pain Intervention(s): Monitored during session, Limited activity within patient's tolerance     Hand Dominance Left   Extremity/Trunk Assessment Upper Extremity Assessment Upper Extremity Assessment: RUE deficits/detail RUE Deficits / Details: R UE bandaged with 3-4 digits bandaged into extension. able to make pincer grasp with 1-2nd digits RUE Coordination: decreased fine motor   Lower Extremity Assessment Lower Extremity Assessment: Defer to PT evaluation   Cervical / Trunk Assessment Cervical / Trunk Assessment: Kyphotic   Communication Communication Communication: No difficulties   Cognition Arousal/Alertness: Awake/alert Behavior During Therapy: Flat affect Overall Cognitive Status: History of cognitive impairments - at baseline Area of Impairment: Orientation, Attention, Memory, Following commands, Safety/judgement, Awareness, Problem solving                 Orientation Level: Disoriented to, Time Current Attention Level: Sustained, Selective Memory: Decreased recall of precautions, Decreased short-term memory Following Commands: Follows one step commands  consistently Safety/Judgement: Decreased awareness of deficits, Decreased awareness of safety Awareness: Emergent Problem Solving: Slow processing, Decreased initiation, Requires verbal cues General Comments: Able to report month/year, situation, when asked what time it was, pt reports close to 5PM (2PM when asked). decreased insight into deficits and problem solving compensatory strategies with impaired use of RUE     General Comments  Multiple skin abrasions    Exercises     Shoulder Instructions      Home Living Family/patient expects to be discharged to:: Private residence Living Arrangements: Spouse/significant other Available Help at Discharge: Family;Available 24 hours/day Type of Home: House Home Access: Stairs to enter [employer] of Steps: 1 Entrance Stairs-Rails: None Home Layout: One level     Bathroom Shower/Tub: Tub/shower unit;Walk-in shower   Bathroom Toilet: Standard     Home Equipment: Agricultural consultant (2 wheels);Grab bars - tub/shower;Cane - single point;Cane - quad          Prior Functioning/Environment Prior Level of Function : Independent/Modified Independent;Driving             Mobility Comments: Did not use device ADLs Comments: Indep with ADLs, wife assists with med mgmt. Pt assisting some with other IADLs. pt was driving but per chart, wife planning to be primary driver        OT Problem List: Impaired balance (sitting and/or standing);Decreased coordination;Decreased cognition;Decreased safety awareness;Impaired UE functional use      OT Treatment/Interventions: Self-care/ADL training;Therapeutic exercise;Energy conservation;DME and/or AE instruction;Therapeutic activities;Patient/family education    OT Goals(Current goals can be found in the care plan section) Acute Rehab OT Goals Patient Stated Goal: go home OT Goal Formulation:  With patient Time For Goal Achievement: 01/23/23 Potential to Achieve Goals: Good ADL  Goals Pt Will Perform Grooming: with modified independence;standing Pt Will Perform Upper Body Dressing: with supervision;sitting Pt Will Perform Lower Body Dressing: with supervision;sit to/from stand  OT Frequency: Min 2X/week    Co-evaluation              AM-PAC OT "6 Clicks" Daily Activity     Outcome Measure Help from another person eating meals?: A Little Help from another person taking care of personal grooming?: A Little Help from another person toileting, which includes using toliet, bedpan, or urinal?: A Little Help from another person bathing (including washing, rinsing, drying)?: A Little Help from another person to put on and taking off regular upper body clothing?: A Little Help from another person to put on and taking off regular lower body clothing?: A Little 6 Click Score: 18   End of Session Nurse Communication: Mobility status  Activity Tolerance: Patient tolerated treatment well Patient left: in bed;with call bell/phone within reach;with bed alarm set  OT Visit Diagnosis: Unsteadiness on feet (R26.81)                Time: 0272-5366 OT Time Calculation (min): 15 min Charges:  OT General Charges $OT Visit: 1 Visit OT Evaluation $OT Eval Low Complexity: 1 Low  Bradd Canary, OTR/L Acute Rehab Services Office: [phone]   Lorre Munroe 01/09/2023, 2:41 PM

## 2023-01-09 NOTE — Assessment & Plan Note (Signed)
HPI highly suspicious for heat syncope, temperature at 7pm in [place] was ~90 F (according to thermometer on my car coming in to work today).  Pt himself agrees that this is most likely what happened. Additionally reassuring is: Pt scoring a 0/5 on the SFSR Pt just had a 2d echo less than 60 days ago (11/15/22, confirmed by pt and visible under procedures tab in chart review) which wasn't particularly impressive for findings to suggest any major cardiac disease (had mild LVH and trivial AR with no AS, normal EF).  Plan: Since his wife already went home for the night (no ride), will put pt in for overnight obs Syncope pathway Tele monitor Repeat CBC/BMP in AM As long as no arrhythmias documented / no further medical events occur overnight however; feel he's likely reasonable to be discharged in AM

## 2023-01-09 NOTE — Evaluation (Addendum)
Physical Therapy Evaluation Patient Details Name: Jake Collins MRN: [id number] DOB: 1946-01-09 Today's Date: 01/09/2023  History of Present Illness  Jake Collins is a 77 y.o. male admitted 7/31. Pt normally takes his garbage down his half mile long driveway and pt decided to try and manually carry his heavy trash can down the half mile long driveway "in the 95 degree heat outside".    Had syncopal episode and passed out with multiple lacerations/brusing on face, UEs and injury to fingers/hand on right.  PMH:  HTN, HLD, small vessel CVD causing some memory loss  Clinical Impression  Pt admitted with above diagnosis. Pt was able to ambulate without device with overall good stability. Scored 21/24 on DGI suggesting low risk of falls. Pt with notable memory deficits and would encouraged 24 hour care initially due to cognition. Will follow acutely however will most likely not need f/u at home as pt declines HHPT f/u today.  Pt currently with functional limitations due to the deficits listed below (see PT Problem List). Pt will benefit from acute skilled PT to increase their independence and safety with mobility to allow discharge.           If plan is discharge home, recommend the following: Assistance with cooking/housework;Help with stairs or ramp for entrance;Assist for transportation;A little help with bathing/dressing/bathroom   Can travel by private vehicle        Equipment Recommendations None recommended by PT  Recommendations for Other Services       Functional Status Assessment Patient has had a recent decline in their functional status and demonstrates the ability to make significant improvements in function in a reasonable and predictable amount of time.     Precautions / Restrictions Precautions Precautions: Fall Restrictions Weight Bearing Restrictions: No      Mobility  Bed Mobility Overal bed mobility: Independent                  Transfers Overall  transfer level: Independent                      Ambulation/Gait Ambulation/Gait assistance: Supervision Gait Distance (Feet): 400 Feet Assistive device: None Gait Pattern/deviations: Step-through pattern, Decreased stride length, Trunk flexed   Gait velocity interpretation: 1.31 - 2.62 ft/sec, indicative of limited community ambulator   General Gait Details: Pt with slightly flexed trunk.  Overall, pts gait is steady without use of device.  Can accept challenges to balance in standing and gait.  Stairs Stairs: Yes Stairs assistance: Min guard Stair Management: One rail Right, Alternating pattern, Forwards Number of Stairs: 3 General stair comments: No issues with steps  Wheelchair Mobility     Tilt Bed    Modified Rankin (Stroke Patients Only)       Balance Overall balance assessment: Needs assistance Sitting-balance support: Single extremity supported, No upper extremity supported, Feet supported Sitting balance-Leahy Scale: Poor Sitting balance - Comments: Lost balance sitting EOB when trying to put sock on as pt using his left hand and couldnt use the right to balance himself as it was bandaged.  Educated pt regarding being mindful of safety with Decreased use of right hand.   Standing balance support: No upper extremity supported, During functional activity Standing balance-Leahy Scale: Fair                   Standardized Balance Assessment Standardized Balance Assessment : Dynamic Gait Index   Dynamic Gait Index Level Surface: Normal Change in Gait  Speed: Normal Gait with Horizontal Head Turns: Normal Gait with Vertical Head Turns: Normal Gait and Pivot Turn: Mild Impairment Step Over Obstacle: Mild Impairment Step Around Obstacles: Normal Steps: Mild Impairment Total Score: 21       Pertinent Vitals/Pain Pain Assessment Pain Assessment: 0-10 Pain Score: 9  Pain Location: "all over" Pain Descriptors / Indicators: Aching, Discomfort,  Grimacing, Guarding Pain Intervention(s): Limited activity within patient's tolerance, Monitored during session, Repositioned    Home Living Family/patient expects to be discharged to:: Private residence Living Arrangements: Spouse/significant other Available Help at Discharge: Family;Available 24 hours/day Type of Home: House Home Access: Stairs to enter Entrance Stairs-Rails: None Entrance Stairs-Number of Steps: 1   Home Layout: One level Home Equipment: Agricultural consultant (2 wheels);Shower seat - built in;Grab bars - tub/shower      Prior Function Prior Level of Function : Independent/Modified Independent;Driving             Mobility Comments: Did not use device ADLs Comments: I with B/D     Hand Dominance   Dominant Hand: Left    Extremity/Trunk Assessment   Upper Extremity Assessment Upper Extremity Assessment: Defer to OT evaluation    Lower Extremity Assessment Lower Extremity Assessment: Overall WFL for tasks assessed    Cervical / Trunk Assessment Cervical / Trunk Assessment: Kyphotic  Communication   Communication: No difficulties  Cognition Arousal/Alertness: Awake/alert Behavior During Therapy: Flat affect Overall Cognitive Status: Impaired/Different from baseline Area of Impairment: Orientation, Attention, Memory, Following commands, Safety/judgement, Awareness, Problem solving                 Orientation Level: Disoriented to, Time (Feb, then March but did get year as 2024;) Current Attention Level: Focused Memory: Decreased recall of precautions, Decreased short-term memory Following Commands: Follows one step commands consistently                General Comments General comments (skin integrity, edema, etc.): Multiple skin abrasions    Exercises     Assessment/Plan    PT Assessment Patient needs continued PT services  PT Problem List Decreased mobility;Decreased knowledge of precautions;Decreased skin integrity       PT  Treatment Interventions DME instruction;Gait training;Functional mobility training;Therapeutic activities;Balance training;Patient/family education    PT Goals (Current goals can be found in the Care Plan section)  Acute Rehab PT Goals Patient Stated Goal: to go home PT Goal Formulation: With patient Time For Goal Achievement: 01/23/23 Potential to Achieve Goals: Good    Frequency Min 1X/week     Co-evaluation               AM-PAC PT "6 Clicks" Mobility  Outcome Measure Help needed turning from your back to your side while in a flat bed without using bedrails?: None Help needed moving from lying on your back to sitting on the side of a flat bed without using bedrails?: None Help needed moving to and from a bed to a chair (including a wheelchair)?: None Help needed standing up from a chair using your arms (e.g., wheelchair or bedside chair)?: None Help needed to walk in hospital room?: None Help needed climbing 3-5 steps with a railing? : A Little 6 Click Score: 23    End of Session Equipment Utilized During Treatment: Gait belt Activity Tolerance: Patient tolerated treatment well Patient left: in bed;with call bell/phone within reach;with bed alarm set Nurse Communication: Mobility status PT Visit Diagnosis: Muscle weakness (generalized) (M62.81)    Time: [id number] PT Time Calculation (min) (  ACUTE ONLY): 19 min   Charges:   PT Evaluation $PT Eval Moderate Complexity: 1 Mod   PT General Charges $$ ACUTE PT VISIT: 1 Visit         Geselle Cardosa M,PT Acute Rehab Services [phone]   Bevelyn Buckles 01/09/2023, 12:02 PM
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
## 2023-01-14 DIAGNOSIS — S61210A Laceration without foreign body of right index finger without damage to nail, initial encounter: Secondary | ICD-10-CM | POA: Diagnosis not present

## 2023-01-22 DIAGNOSIS — M79644 Pain in right finger(s): Secondary | ICD-10-CM | POA: Diagnosis not present

## 2023-01-24 DIAGNOSIS — M79644 Pain in right finger(s): Secondary | ICD-10-CM | POA: Diagnosis not present

## 2023-01-27 DIAGNOSIS — Z87898 Personal history of other specified conditions: Secondary | ICD-10-CM | POA: Diagnosis not present

## 2023-01-27 DIAGNOSIS — R972 Elevated prostate specific antigen [PSA]: Secondary | ICD-10-CM | POA: Diagnosis not present

## 2023-01-27 DIAGNOSIS — R338 Other retention of urine: Secondary | ICD-10-CM | POA: Diagnosis not present

## 2023-01-27 DIAGNOSIS — R351 Nocturia: Secondary | ICD-10-CM | POA: Diagnosis not present

## 2023-01-27 DIAGNOSIS — N401 Enlarged prostate with lower urinary tract symptoms: Secondary | ICD-10-CM | POA: Diagnosis not present

## 2023-01-27 DIAGNOSIS — R350 Frequency of micturition: Secondary | ICD-10-CM | POA: Diagnosis not present

## 2023-01-28 DIAGNOSIS — S61210A Laceration without foreign body of right index finger without damage to nail, initial encounter: Secondary | ICD-10-CM | POA: Diagnosis not present

## 2023-01-28 DIAGNOSIS — M25641 Stiffness of right hand, not elsewhere classified: Secondary | ICD-10-CM | POA: Diagnosis not present

## 2023-02-03 DIAGNOSIS — M79644 Pain in right finger(s): Secondary | ICD-10-CM | POA: Diagnosis not present

## 2023-02-04 DIAGNOSIS — S61210D Laceration without foreign body of right index finger without damage to nail, subsequent encounter: Secondary | ICD-10-CM | POA: Diagnosis not present

## 2023-02-18 DIAGNOSIS — S61210D Laceration without foreign body of right index finger without damage to nail, subsequent encounter: Secondary | ICD-10-CM | POA: Diagnosis not present

## 2023-03-11 ENCOUNTER — Other Ambulatory Visit: Payer: Self-pay | Admitting: Psychiatry

## 2023-03-11 DIAGNOSIS — F3342 Major depressive disorder, recurrent, in full remission: Secondary | ICD-10-CM

## 2023-03-11 DIAGNOSIS — F411 Generalized anxiety disorder: Secondary | ICD-10-CM

## 2023-03-11 NOTE — Telephone Encounter (Signed)
RF not appropriate. Dose change 12/19/22 450mg  every other day.

## 2023-03-14 DIAGNOSIS — Z85828 Personal history of other malignant neoplasm of skin: Secondary | ICD-10-CM | POA: Diagnosis not present

## 2023-03-14 DIAGNOSIS — Z08 Encounter for follow-up examination after completed treatment for malignant neoplasm: Secondary | ICD-10-CM | POA: Diagnosis not present

## 2023-03-14 DIAGNOSIS — B078 Other viral warts: Secondary | ICD-10-CM | POA: Diagnosis not present

## 2023-03-14 DIAGNOSIS — Z85820 Personal history of malignant melanoma of skin: Secondary | ICD-10-CM | POA: Diagnosis not present

## 2023-04-09 DIAGNOSIS — H5213 Myopia, bilateral: Secondary | ICD-10-CM | POA: Diagnosis not present

## 2023-04-09 DIAGNOSIS — Z961 Presence of intraocular lens: Secondary | ICD-10-CM | POA: Diagnosis not present

## 2023-04-09 DIAGNOSIS — H401111 Primary open-angle glaucoma, right eye, mild stage: Secondary | ICD-10-CM | POA: Diagnosis not present

## 2023-04-09 DIAGNOSIS — H401123 Primary open-angle glaucoma, left eye, severe stage: Secondary | ICD-10-CM | POA: Diagnosis not present

## 2023-04-23 ENCOUNTER — Telehealth: Payer: Self-pay

## 2023-04-23 ENCOUNTER — Other Ambulatory Visit: Payer: Self-pay

## 2023-04-23 ENCOUNTER — Other Ambulatory Visit: Payer: Self-pay | Admitting: Psychiatry

## 2023-04-23 DIAGNOSIS — F411 Generalized anxiety disorder: Secondary | ICD-10-CM

## 2023-04-23 DIAGNOSIS — F3342 Major depressive disorder, recurrent, in full remission: Secondary | ICD-10-CM

## 2023-04-23 DIAGNOSIS — F331 Major depressive disorder, recurrent, moderate: Secondary | ICD-10-CM

## 2023-04-23 MED ORDER — LITHIUM CARBONATE ER 450 MG PO TBCR: 450.0000 mg | EXTENDED_RELEASE_TABLET | ORAL | 0 refills | Status: DC

## 2023-04-23 NOTE — Telephone Encounter (Signed)
Spoke with pt's wife, [name], she stated that she is his POA, and that he does take Lithium 450mg . Will deny this request.

## 2023-04-23 NOTE — Telephone Encounter (Signed)
Called pt regarding Lithium 450 mg under the med tab these are the notes I found. Is he still taking this dose? LV 07/11 LVM to [facility]  Medication Notes: Jake Collins, Jake Collins, CPhT  -- 01/09/2023 15:21 >> ATTICUS, [name]   Thu Jan 09, 2023  3:21 PM Patient not sure about this medicine

## 2023-05-16 DIAGNOSIS — F411 Generalized anxiety disorder: Secondary | ICD-10-CM | POA: Diagnosis not present

## 2023-05-16 DIAGNOSIS — F331 Major depressive disorder, recurrent, moderate: Secondary | ICD-10-CM | POA: Diagnosis not present

## 2023-05-16 DIAGNOSIS — E039 Hypothyroidism, unspecified: Secondary | ICD-10-CM | POA: Diagnosis not present

## 2023-05-16 DIAGNOSIS — R350 Frequency of micturition: Secondary | ICD-10-CM | POA: Diagnosis not present

## 2023-05-16 DIAGNOSIS — K219 Gastro-esophageal reflux disease without esophagitis: Secondary | ICD-10-CM | POA: Diagnosis not present

## 2023-05-16 DIAGNOSIS — I679 Cerebrovascular disease, unspecified: Secondary | ICD-10-CM | POA: Diagnosis not present

## 2023-05-16 DIAGNOSIS — R269 Unspecified abnormalities of gait and mobility: Secondary | ICD-10-CM | POA: Diagnosis not present

## 2023-05-16 DIAGNOSIS — I10 Essential (primary) hypertension: Secondary | ICD-10-CM | POA: Diagnosis not present

## 2023-05-16 DIAGNOSIS — N183 Chronic kidney disease, stage 3 unspecified: Secondary | ICD-10-CM | POA: Diagnosis not present

## 2023-05-16 DIAGNOSIS — R413 Other amnesia: Secondary | ICD-10-CM | POA: Diagnosis not present

## 2023-05-16 DIAGNOSIS — I700 Atherosclerosis of aorta: Secondary | ICD-10-CM | POA: Diagnosis not present

## 2023-05-16 DIAGNOSIS — E7800 Pure hypercholesterolemia, unspecified: Secondary | ICD-10-CM | POA: Diagnosis not present

## 2023-05-20 ENCOUNTER — Telehealth: Payer: Self-pay | Admitting: Diagnostic Neuroimaging

## 2023-05-20 ENCOUNTER — Other Ambulatory Visit: Payer: Self-pay | Admitting: Psychiatry

## 2023-05-20 DIAGNOSIS — F331 Major depressive disorder, recurrent, moderate: Secondary | ICD-10-CM

## 2023-05-20 NOTE — Telephone Encounter (Signed)
Pt's wife, Para March called, PCP, Dr. Pat[facility]; suggested a change to add a medication, Aricept. Advised see neurologist for change to Aricept because pt still having memory challenges. Would like a call back

## 2023-05-21 ENCOUNTER — Telehealth: Payer: Self-pay | Admitting: Psychiatry

## 2023-05-21 NOTE — Telephone Encounter (Signed)
I spoke with patient's wife.  Symptoms are gradually progressing.  Asked about adding donezepil which I agree with.  Also gave additional resources including [facility], [facility] [place] and [place] care guide program.  We are always available to answer any additional questions via phone or MyChart message. May follow up in office as needed.   Suanne Marker, MD 05/21/2023, 4:40 PM Certified in Neurology, Neurophysiology and Neuroimaging  [facility] [address] [place], [place] [address] [phone]

## 2023-05-21 NOTE — Telephone Encounter (Signed)
Next visit is 06/24/23. [name] called requesting a refill on Lithium 450 mg called to:  [id number] - [place], [place] - [address]   Phone: [phone]  Fax: [phone]

## 2023-05-21 NOTE — Telephone Encounter (Signed)
Spoke to wife (checked DPR) Wife states PCP wanted to make sure ok to add Aricept to medication management for MILD DEMENTIA Per Dr Marjory Lies ok for PCP to add aricept. Wife states will call PCP today to  add Aricept Wife states pt is having memory challenges. Wife states pt still drives short distances with her in car. Wife was very persistent for appointment . Made pt aware Dr Marjory Lies will call to discuss concerns she has about pt memory challenges. Pt thanked me for calling

## 2023-05-21 NOTE — Telephone Encounter (Signed)
Medication was already sent through the interface. Pt notified.

## 2023-05-21 NOTE — Telephone Encounter (Signed)
noted 

## 2023-05-26 ENCOUNTER — Other Ambulatory Visit (HOSPITAL_COMMUNITY): Payer: Self-pay | Admitting: *Deleted

## 2023-05-26 DIAGNOSIS — R1310 Dysphagia, unspecified: Secondary | ICD-10-CM

## 2023-06-17 ENCOUNTER — Other Ambulatory Visit: Payer: Self-pay | Admitting: Psychiatry

## 2023-06-17 DIAGNOSIS — F331 Major depressive disorder, recurrent, moderate: Secondary | ICD-10-CM

## 2023-06-17 DIAGNOSIS — F411 Generalized anxiety disorder: Secondary | ICD-10-CM

## 2023-06-19 ENCOUNTER — Encounter (HOSPITAL_COMMUNITY): Payer: Self-pay

## 2023-06-19 ENCOUNTER — Ambulatory Visit (HOSPITAL_COMMUNITY)
Admission: RE | Admit: 2023-06-19 | Discharge: 2023-06-19 | Disposition: A | Discharge status: Home / Self Care | Payer: PPO | Source: Ambulatory Visit | Attending: Internal Medicine | Admitting: Internal Medicine

## 2023-06-19 DIAGNOSIS — K219 Gastro-esophageal reflux disease without esophagitis: Secondary | ICD-10-CM | POA: Diagnosis not present

## 2023-06-19 DIAGNOSIS — R059 Cough, unspecified: Secondary | ICD-10-CM | POA: Diagnosis not present

## 2023-06-19 DIAGNOSIS — R1310 Dysphagia, unspecified: Secondary | ICD-10-CM

## 2023-06-19 DIAGNOSIS — R1312 Dysphagia, oropharyngeal phase: Secondary | ICD-10-CM | POA: Diagnosis present

## 2023-06-19 NOTE — Progress Notes (Signed)
 Modified Barium Swallow Study  Patient Details  Name: Jake Collins MRN: [id number] Date of Birth: 1946/05/01  Today's Date: 06/19/2023  Modified Barium Swallow completed.  Full report located under Chart Review in the Imaging Section.  History of Present Illness Presenting upon referral from PCP d/t increased coughing with solids and at night when he lays down to sleep. Denies coughing/throar clearing with liquids. Wife states pt enjoys eating foods like chocolate, potato chips and popcorn and all increased coughing. Sx initially began Jan 2024. Started on medication for GERD at that time (Protonix ) BID, since suggested decreasing to 1x day which pt has been doing. Within last 1-2 months, sx have begun increasing. Pt denies globus sensation, denies voice changes.   Clinical Impression Mr. Mode demonstrates mild oropharyngeal dysphagia c/b diffuse pharyngeal residue with impaired sensation. Consistencies administered include thin, mildly thick, moderately thick liquids, puree and regular solid, and barium tablet taken with thin barium. No airway invasion demonstrated throughout assessment. Oral phase c/b reduced lingual control which results in premature spillage during directed bolus hold. Some lingual pumping noted with A-P transit. Pharyngeal phase c/b complete hyolaryngeal excursion, which is effective in protecting airway, no penetration or aspiration seen on today's study. Absent epiglottic inversion results in collection of thin and mildly thick liquid in valleculae. Epiglottic inversion is achieved with increased viscosity of boluses, and subsequently reduces pharyngeal residue. Reduced duration of UES opening results in pooling of residue in pyriform sinuses across consistencies. Pt is not sensate to pharyngeal residue, no spontaneous attempts made to clear. Cues for dry re-swallow were effective for clearing residuals from pharynx. No airway invasion seen following the swallow despite  presence of residue.  Recommend regular diet with thin liquids, BID thorough oral care, adhere to medication for management of GERD, eat while upright and remain upright for 60 minutes following intake, consider supplemental GERD management products such as Reflux Raft or Reflux Gourmet, utilize intermittent dry swallow while eating/drinking to clear pharyngeal residue. Pt may benefit from ENT consultation if coughing persists.  Factors that may increase risk of adverse event in presence of aspiration Jake Collins & Jake Collins 2021): Reduced cognitive function  Swallow Evaluation Recommendations Recommendations: PO diet PO Diet Recommendation: Regular;Thin liquids (Level 0) Medication Administration: Whole meds with liquid Supervision: Patient able to self-feed Swallowing strategies  : Multiple dry swallows after each bite/sip Oral care recommendations: Oral care BID (2x/day) Recommended consults: Consider ENT consultation (if cough sx do not decrease)      Jake Collins Ned 06/19/2023,1:25 PM

## 2023-06-24 ENCOUNTER — Ambulatory Visit (INDEPENDENT_AMBULATORY_CARE_PROVIDER_SITE_OTHER): Payer: PPO | Admitting: Psychiatry

## 2023-06-24 ENCOUNTER — Other Ambulatory Visit: Payer: Self-pay | Admitting: Internal Medicine

## 2023-06-24 DIAGNOSIS — R29898 Other symptoms and signs involving the musculoskeletal system: Secondary | ICD-10-CM

## 2023-06-25 ENCOUNTER — Ambulatory Visit
Admission: RE | Admit: 2023-06-25 | Discharge: 2023-06-25 | Disposition: A | Discharge status: Home / Self Care | Payer: PPO | Source: Ambulatory Visit | Attending: Internal Medicine | Admitting: Internal Medicine

## 2023-06-25 DIAGNOSIS — R29898 Other symptoms and signs involving the musculoskeletal system: Secondary | ICD-10-CM

## 2023-07-07 ENCOUNTER — Ambulatory Visit: Payer: PPO | Admitting: Psychiatry

## 2023-07-28 ENCOUNTER — Other Ambulatory Visit: Payer: Self-pay | Admitting: Family Medicine

## 2023-07-28 DIAGNOSIS — R053 Chronic cough: Secondary | ICD-10-CM

## 2023-08-07 ENCOUNTER — Ambulatory Visit
Admission: RE | Admit: 2023-08-07 | Discharge: 2023-08-07 | Disposition: A | Discharge status: Home / Self Care | Payer: PPO | Source: Ambulatory Visit | Attending: Family Medicine | Admitting: Family Medicine

## 2023-08-07 DIAGNOSIS — R053 Chronic cough: Secondary | ICD-10-CM

## 2023-08-07 MED ORDER — IOPAMIDOL (ISOVUE-370) INJECTION 76%
75.0000 mL | Freq: Once | INTRAVENOUS | Status: AC | PRN
  Administered 2023-08-07: 65 mL via INTRAVENOUS

## 2023-08-14 ENCOUNTER — Ambulatory Visit (INDEPENDENT_AMBULATORY_CARE_PROVIDER_SITE_OTHER): Payer: PPO | Admitting: Psychiatry

## 2023-08-14 ENCOUNTER — Encounter: Payer: Self-pay | Admitting: Psychiatry

## 2023-08-14 DIAGNOSIS — F03A0 Unspecified dementia, mild, without behavioral disturbance, psychotic disturbance, mood disturbance, and anxiety: Secondary | ICD-10-CM

## 2023-08-14 DIAGNOSIS — F331 Major depressive disorder, recurrent, moderate: Secondary | ICD-10-CM

## 2023-08-14 DIAGNOSIS — F411 Generalized anxiety disorder: Secondary | ICD-10-CM | POA: Diagnosis not present

## 2023-08-14 MED ORDER — DONEPEZIL HCL 10 MG PO TABS
10.0000 mg | ORAL_TABLET | Freq: Every day | ORAL | 3 refills | Status: AC
Start: 2023-08-14 — End: ?

## 2023-08-14 MED ORDER — LITHIUM CARBONATE ER 450 MG PO TBCR
450.0000 mg | EXTENDED_RELEASE_TABLET | ORAL | 1 refills | Status: DC
Start: 2023-08-14 — End: 2023-12-15

## 2023-08-14 MED ORDER — VILAZODONE HCL 10 MG PO TABS
10.0000 mg | ORAL_TABLET | Freq: Every day | ORAL | 1 refills | Status: DC
Start: 2023-08-14 — End: 2024-02-22

## 2023-08-14 NOTE — Patient Instructions (Addendum)
 Alzheimer's Association  [email or web address] Contact [facility] and see if they have a Child psychotherapist to help with resources. Possible IV med Lecanemab  [email or web address] MMSE 23/30 , May 2024 22/30
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
## 2023-08-16 ENCOUNTER — Other Ambulatory Visit: Payer: Self-pay | Admitting: Diagnostic Neuroimaging

## 2023-08-18 ENCOUNTER — Other Ambulatory Visit: Payer: Self-pay | Admitting: Psychiatry

## 2023-08-18 DIAGNOSIS — F331 Major depressive disorder, recurrent, moderate: Secondary | ICD-10-CM

## 2023-08-18 DIAGNOSIS — F411 Generalized anxiety disorder: Secondary | ICD-10-CM

## 2023-08-18 DIAGNOSIS — F3342 Major depressive disorder, recurrent, in full remission: Secondary | ICD-10-CM

## 2023-08-18 NOTE — Telephone Encounter (Signed)
 Last seen on 10/16/22 per note " Return in about 1 year (around 10/16/2023)   No follow up scheduled

## 2023-08-19 ENCOUNTER — Other Ambulatory Visit: Payer: Self-pay | Admitting: Diagnostic Neuroimaging

## 2023-09-17 ENCOUNTER — Other Ambulatory Visit: Payer: Self-pay | Admitting: Psychiatry

## 2023-09-17 ENCOUNTER — Other Ambulatory Visit: Payer: Self-pay | Admitting: Diagnostic Neuroimaging

## 2023-09-17 DIAGNOSIS — F3342 Major depressive disorder, recurrent, in full remission: Secondary | ICD-10-CM

## 2023-09-17 DIAGNOSIS — F411 Generalized anxiety disorder: Secondary | ICD-10-CM

## 2023-09-18 NOTE — Telephone Encounter (Signed)
 Dose change.

## 2023-12-15 ENCOUNTER — Ambulatory Visit (INDEPENDENT_AMBULATORY_CARE_PROVIDER_SITE_OTHER): Admitting: Psychiatry

## 2023-12-15 ENCOUNTER — Encounter: Payer: Self-pay | Admitting: Psychiatry

## 2023-12-15 DIAGNOSIS — Z79899 Other long term (current) drug therapy: Secondary | ICD-10-CM | POA: Diagnosis not present

## 2023-12-15 DIAGNOSIS — F03A0 Unspecified dementia, mild, without behavioral disturbance, psychotic disturbance, mood disturbance, and anxiety: Secondary | ICD-10-CM | POA: Diagnosis not present

## 2023-12-15 DIAGNOSIS — F331 Major depressive disorder, recurrent, moderate: Secondary | ICD-10-CM | POA: Diagnosis not present

## 2023-12-15 MED ORDER — MEMANTINE HCL 10 MG PO TABS: 10.0000 mg | ORAL_TABLET | Freq: Two times a day (BID) | ORAL | 3 refills | Status: DC

## 2023-12-15 MED ORDER — LITHIUM CARBONATE ER 450 MG PO TBCR: 450.0000 mg | EXTENDED_RELEASE_TABLET | ORAL | 1 refills | Status: DC

## 2023-12-15 NOTE — Progress Notes (Signed)
 Jake Collins [id number] 10/20/1945 78 y.o.  Subjective:   Patient ID:  Jake Collins is a 78 y.o. (DOB 02-07-1946) male.  Chief Complaint:  Chief Complaint  Patient presents with   Follow-up    Mood and cog, and meds     Jake Collins presents to the office today for follow-up of major depression and GAD.  seen June 2020 and was doing well and no meds were changed.  The only psychiatric med for the last few years has been venlafaxine  50 mg daily.  12/07/2019 appointment with the following noted: Remained well.   Concerned about venlafaxine  sexual SE.  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 Neither he nor wife notced change in mood anxiety since that time.   Has plenty of money and no longer interested in the money.   Wife is concerned he's moody.   Gym 3 times weekly. Plan: Cont med long term DT high relapse risk.  Has a long history of depression and anxiety.  Has had multiple relapses before.  Been under my care since 2006.  He's reduced venlafaxine  to 25 mg daily and still complains of sexual SE but wife complains he's moody. Option lamotrigine, lithium  alone, Viibryd . Lithium  150 mg daily for mood and wean off venlafaxine   By 12.5 mg daily for 3 weeks and stop it DT sexual SE.  02/10/20 appt with the following noted: No problems off venlafaxine .  No depression.  Sexual SE a little better.  Moodiness is better. Started melatonin and helped sleep.  No SE lithium . Not more anxious.    02/09/21 appt with following noted:  No depression.   Patient Collins any recent difficulty with anxiety.  Patient Collins difficulty with sleep initiation or maintenance. Collins appetite disturbance.  Patient reports that energy and motivation have been good.  Patient Collins any difficulty with concentration.  Patient Collins any suicidal ideation. Plan: Lithium  150 mg daily for mood and no problems off of the venlafaxine ..    10/10/21 appt noted: Wife not happy with me.  She's had 4th and 5th back surgery.  Does all kinds of things for him.  She says he's moodier than normal.  Money is not a problem. More short tempered than he used to be.  Can't play golf bc of knees.  Will need TKR in the next few mos.  Still goes to gym 3 times weekly.  Would like to get back to golf bc no hobbies. PLAN: To avoid sexual SE will pick Trintellix or Viibryd .   Viibryd  10 mg daily. Lithium  150 mg daily for mood and no problems off of the venlafaxine ..  11/29/21 appt noted: No effect Viibryd  good or bad.  Takes with breakfast. No SE Would like more benefit.   Sleep good.  Lower appetite but wt ok. Plan: Increase Viibryd  to  20 mg daily for depression. Lithium  150 mg daily for mood   02/14/22 appt noted: Some additional benefit mood is better and wife agrees. Never been a big talker but is talking more with her.  Married 59 years.  Doesn't feel depressed.  Less irritable with it.   Going on vacation with family.  No GI px with meds. Taking Pepcid .  Viibryd  20 and lithium  150 mg daily. Sexual SE ED.  Can put up with it if it helps. Plan: Better with Viibryd  to  20 mg daily for depression. Lithium  150 mg daily for mood  06/20/22 appt noted: Got confused about a change in generic.   Mood is good and pleased with response to Viibryd . Disc low dose lithium . Doing well doc. Plan no med changes  07/17/22 TC from wife reporting H with TKR and then complications and hosp and has been more confused.  Last hosp 07/11/22.   08/15/22 TC:  Wife reports that husband is in bad shape. Any time he deals with stress gets this way. Feels it is his mental condition that is causing problems, not dementia. He doesn't know the grandkids names and can't remember things. She says in his mind  he is fine.    Was put on cancellation list.  08/20/22 appt noted:  seen urgently per wife, Jake, and with her Still on viibryd  20 & lithium  150 daily. I  have been a pain in the Butt to live with.  Mainly forgetful since surgery.  Needs repetition.  Trouble with knee.  Doesn't feel right yet.  Only Tylenol  for pain.  Times of feeling confused.  Not really depressed but maybe a little. Good appetite.  Sometimes sleeps too much.   W sees confusion, GI bleeding and U incontinence since surgery.   W says after surgery 07/08/22 had not been given his usual meds and he was having panic.  Couple days later UGI bleed and hosp and was confused in hospital.   Per wife this is 4th time in 60 years she's seen a change.  Less involved and less watching golf.  PT noticed he was out of it and was more confused not knowing gkids names. Wife says sleeping 12-15 hours and then some in day.  Was worried about this appt and got dressed at 5 and kept asking if it was time to go to the hospital.   Had lost 15 # thinks he gained some back.   TSH checked about 3 mos ago.  08/30/22 appt noted:  seen with wife Got confused about a doctor's appt.  Wife thinks when low BP he gets more confused.  Has stopped HCTZ. She still feels he's not himself.  A little memory problems before surgery but 50% worse now.   Still knee pain. Family notices he's not himself.   PCP Addie Glatter will retire. [facility] at [facility]. W says past history of paranoid delusions for mos thinking they were going to freeze to death abut not delusional now.   Sleep ok without napping. Wants to drive car again.  09/13/22 appt noted:  seen with wife Had to drive to hospital bc wife had to go and he did ok. Feel better.  W said he's having good days and bad days.  Anger really bugs me. Wife reduced his Viibryd   from 20 to 10 mg daily bc was concerned it might be making him angry. Sleep like a log.   He has trouble remembering pills without help of wife.   Dr. Glatter checking labs upcoming Monday W doesn't know why he's having such memory problems after surgery like trouble finding keys.    10/11/22 appt  noted:  with wife Jake Collins dep.  Wife agrees he's got more interest in other things and is better than he was.   It was a bad hole I was in after that operation.  Wife points out he couldn't say that before but is better now. She says this is first week without doc's appts.   No anxiety or fear. Sleep good.   She says he still has problems with  meds but is cognitively better. Seems less antsy with Viibryd  10 vs 20 mg daily. No problms walking.  Memory is still behind what it does.   Good appetite.   Remote history of afib.    12/19/22 appt noted: He has no concerns.  Feels like his dep is under control. She thinks may be Memantine  helped with mood some but can still be irritable and angry at times.   Tripped twice.   No NVD.  No new health concerns. Sleep is good.   Per wife come a long way from 07/08/22 Psych med viibryd  10, lithium  150 daily without SE Plan: Reduced Viibryd  10 mg daily for depression. Will increase Lithium  slightly to 1/2 of 450mg  daily for irritable mood and low doses have neuroprotective effect Start memantine  10 BID  08/14/23 appt noted:  seen with wife.  Med: Viibryd  10, lithium  ER 225, memantine  10 BID, neuro Dr. Margaret added donepezil  5 mg daily. Read about increasing donepezil .   Dr. SHAUNNA will not see people after the dx.   No sig dep nor anxiety .   No SE or med concerns.   Memory loss is progressing.  Looking at moving to [facility] to be near son who has early retirement.  Hard giving up a house you built and not sure about it.  Not sure whether they will do it.   Plan increase donepezil  to 10 per usual dose  12/15/23 appt noted:  Med: Viibryd  10, memantine  10 BID, donepezil  10 daily, lithium  ER 225 mg daily, melatonin HS.   Doing good.  Not depressed but had a bad time the other day per wife.  He argues more per wife, meaner than ever. No SE or med concerns.  He's says sleeping fine.  Sometimes trouble going to sleep.  Used to go to bed 930 all life  but has to stay up later.   Sleeps until 915. Plenty of sleep bc sleeps late.   No sig tremors. No D, N.  Moving to [place] and concerned about doctors.  Moving after 02/10/24.    Downsizing some.  Son in [place].    Mini-Mental    Flowsheet Row Office Visit from 08/14/2023 in [facility] Office Visit from 10/16/2022 in [facility]  Total Score (max 30 points ) 23 22   Flowsheet Row ED to Hosp-Admission (Discharged) from 01/08/2023 in [facility] to Hosp-Admission (Discharged) from 07/11/2022 in [place] LONG 4TH FLOOR [facility]GR[facility] UROLOGY Admission (Discharged) from 07/08/2022 in [place] [facility]  C-SSRS RISK CATEGORY No Risk No Risk No Risk    Mini-Mental    Flowsheet Row Office Visit from 08/14/2023 in [facility] Office Visit from 10/16/2022 in [facility]  Total Score (max 30 points ) 23 22   Flowsheet Row ED to Hosp-Admission (Discharged) from 01/08/2023 in [place] [facility] to Hosp-Admission (Discharged) from 07/11/2022 in [place] [facility] 4TH FLOOR [facility]GR[facility] UROLOGY Admission (Discharged) from 07/08/2022 in [place] [facility]  C-SSRS RISK CATEGORY No Risk No Risk No Risk      Past Psychiatric Medication Trials:  Lithium , Lexapro, Zoloft, Effexor  XR 150 sexual SE, Klonopin  Viibryd  20  M history psych hosp and ECT.  F neglected him. M and B, uncle,  schizophrenic.  D psych tx.  PCP Dr. Merilee , [place]  Review of Systems:  Review of Systems  Cardiovascular:  Negative  for chest pain and palpitations.  Gastrointestinal:  Negative for diarrhea and nausea.  Genitourinary:  Positive for urgency.       ED  Musculoskeletal:  Positive for arthralgias.  Neurological:  Negative for tremors.  Psychiatric/Behavioral:  Negative for dysphoric mood. The patient is not nervous/anxious.     Medications: I have  reviewed the patient's current medications.  Current Outpatient Medications  Medication Sig Dispense Refill   acetaminophen  (TYLENOL ) 500 MG tablet Take 2 tablets (1,000 mg total) by mouth every 8 (eight) hours as needed. (Patient taking differently: Take 1,000 mg by mouth every 8 (eight) hours as needed for moderate pain (pain score 4-6).) 30 tablet 0   donepezil  (ARICEPT ) 10 MG tablet Take 1 tablet (10 mg total) by mouth at bedtime. 90 tablet 3   glucosamine-chondroitin 500-400 MG tablet Take 1 tablet by mouth daily.     latanoprost  (XALATAN ) 0.005 % ophthalmic solution Place 1 drop into both eyes at bedtime.     levothyroxine  (SYNTHROID , LEVOTHROID) 100 MCG tablet Take 100 mcg by mouth daily.       loratadine  (CLARITIN ) 10 MG tablet Take 10 mg by mouth daily as needed for allergies.     Melatonin 5 MG CHEW Chew 10 mg by mouth at bedtime. Gummy tabs     nebivolol  (BYSTOLIC ) 5 MG tablet Take 5 mg by mouth daily.     pantoprazole  (PROTONIX ) 40 MG tablet Take 40 mg by mouth 2 (two) times daily.     REPATHA SURECLICK 140 MG/ML SOAJ Inject 140 mg into the skin every 14 (fourteen) days. Every other Saturday     silodosin  (RAPAFLO ) 8 MG CAPS capsule Take 1 capsule (8 mg total) by mouth daily with breakfast. 30 capsule 2   Vilazodone  HCl (VIIBRYD ) 10 MG TABS Take 1 tablet (10 mg total) by mouth daily. 90 tablet 1   lithium  carbonate (ESKALITH ) 450 MG ER tablet Take 1 tablet (450 mg total) by mouth every other day. 45 tablet 1   memantine  (NAMENDA ) 10 MG tablet Take 1 tablet (10 mg total) by mouth 2 (two) times daily. 180 tablet 3   No current facility-administered medications for this visit.    Medication Side Effects: sexual SE  Allergies:  Allergies  Allergen Reactions   Atorvastatin     Other Reaction(s): Aches and memory   Rosuvastatin      Other Reaction(s): Aches and memory   Sulfa Antibiotics     Elspeth Louder syndrome     Past Medical History:  Diagnosis Date   Arthritis     Cancer (HCC)    skin melanoma   Depression    Diverticulitis    2 bouts in 10 years   Hyperlipemia    Hypertension    Hypothyroid    Scoliosis    Thyroid  disease     Family History  Problem Relation Age of Onset   Heart disease Father     Social History   Socioeconomic History   Marital status: Married    Spouse name: Not on file   Number of children: Not on file   Years of education: Not on file   Highest education level: Not on file  Occupational History   Not on file  Tobacco Use   Smoking status: Never   Smokeless tobacco: Never  Vaping Use   Vaping status: Never Used  Substance and Sexual Activity   Alcohol use: No   Drug use: No   Sexual activity: Not Currently  Other Topics  Concern   Not on file  Social History Narrative   Not on file   Social Drivers of Health   Financial Resource Strain: Not on file  Food Insecurity: Low Risk  (09/29/2023)   Received from [facility]   Hunger Vital Sign    Within the past 12 months, you worried that your food would run out before you got money to buy more: Never true    Within the past 12 months, the food you bought just didn't last and you didn't have money to get more. : Never true  Transportation Needs: No Transportation Needs (09/29/2023)   Received from [facility]    In the past 12 months, has lack of reliable transportation kept you from medical appointments, meetings, work or from getting things needed for daily living? : No  Physical Activity: Not on file  Stress: Not on file  Social Connections: Not on file  Intimate Partner Violence: Not At Risk (01/09/2023)   Humiliation, Afraid, Rape, and Kick questionnaire    Fear of Current or Ex-Partner: No    Emotionally Abused: No    Physically Abused: No    Sexually Abused: No    Past Medical History, Surgical history, Social history, and Family history were reviewed and updated as appropriate.   Please see review of systems for further  details on the patient's review from today.   Objective:   Physical Exam:  There were no vitals taken for this visit.  Physical Exam Constitutional:      General: He is not in acute distress.    Appearance: He is well-developed.  Musculoskeletal:        General: No deformity.  Neurological:     Mental Status: He is alert and oriented to person, place, and time.     Coordination: Coordination normal.  Psychiatric:        Attention and Perception: Attention and perception normal. He does not perceive auditory or visual hallucinations.        Mood and Affect: Mood is not anxious or depressed. Affect is not labile, blunt or inappropriate.        Speech: Speech normal.        Behavior: Behavior normal.        Thought Content: Thought content normal. Thought content is not delusional. Thought content does not include homicidal or suicidal ideation. Thought content does not include suicidal plan.        Cognition and Memory: Cognition is not impaired. He exhibits impaired recent memory.        Judgment: Judgment normal.     Comments: Insight intact. No delusions.  Mild forgetfulness but not severe. Pleasant Has reasonable reasoning ability given memory losse. MMSE 23/30 and May 2024 was 22/30      Lab Review:     Component Value Date/Time   NA 135 01/09/2023 0249   K 3.6 01/09/2023 0249   CL 104 01/09/2023 0249   CO2 22 01/09/2023 0249   GLUCOSE 111 (H) 01/09/2023 0249   BUN 18 01/09/2023 0249   CREATININE 1.29 (H) 01/09/2023 0249   CALCIUM  8.6 (L) 01/09/2023 0249   PROT 6.6 01/08/2023 2017   ALBUMIN 3.7 01/08/2023 2017   AST 28 01/08/2023 2017   ALT 20 01/08/2023 2017   ALKPHOS 66 01/08/2023 2017   BILITOT 0.5 01/08/2023 2017   GFRNONAA 56 (L) 01/09/2023 0249   GFRAA >60 04/15/2019 0308       Component Value Date/Time  WBC 13.1 (H) 01/09/2023 0249   RBC 4.79 01/09/2023 0249   HGB 14.4 01/09/2023 0249   HCT 43.8 01/09/2023 0249   PLT 215 01/09/2023 0249   MCV  91.4 01/09/2023 0249   MCH 30.1 01/09/2023 0249   MCHC 32.9 01/09/2023 0249   RDW 14.7 01/09/2023 0249   LYMPHSABS 1.5 01/08/2023 2017   MONOABS 0.8 01/08/2023 2017   EOSABS 0.2 01/08/2023 2017   BASOSABS 0.1 01/08/2023 2017    Lithium  Lvl  Date Value Ref Range Status  07/13/2022 <0.06 (L) 0.60 - 1.20 mmol/L Final    Comment:    Performed at [facility], [address], [place], [place] [address]   07/13/22 lithium  level not detectable on 150 mg HS.  ? Compliance.  09/29/22 MRI brain: IMPRESSION: 1. No acute intracranial process. 2. Central pattern of cerebral volume loss and bilateral hippocampal atrophy. 3. Moderate chronic small vessel disease with old infarcts in the right middle frontal gyrus, left putamen, and right cerebellar hemisphere. 4. Sulcal hemosiderin staining along the right superior frontal sulcus, likely sequela of prior subarachnoid hemorrhage.  .res Assessment: Plan:    Slevin was seen today for follow-up.  Diagnoses and all orders for this visit:  Major depressive disorder, recurrent episode, moderate (HCC) -     lithium  carbonate (ESKALITH ) 450 MG ER tablet; Take 1 tablet (450 mg total) by mouth every other day. -     Lithium  level -     Basic metabolic panel -     TSH  Mild dementia, unspecified dementia type, unspecified whether behavioral, psychotic, or mood disturbance or anxiety (HCC) -     memantine  (NAMENDA ) 10 MG tablet; Take 1 tablet (10 mg total) by mouth 2 (two) times daily.  Lithium  use -     Basic metabolic panel -     TSH    30 min face to face time with patient .  Seen with wife.  Overall dementia appears pretty stable over the last year.  Still good in conversation and reasoning ability except where memory required.  Some denial.   We discussed Cont med long term DT high relapse risk.  Has a long history of depression and anxiety.  Has had multiple relapses before.  Been under my care since 2006.  Hx relapse  without lithium   Viibryd  10 mg daily for depression.  Doing well with this Lithium   1/2 of 450mg  daily for irritable mood and low doses have neuroprotective effect No SE  Check lithium  level and labs  09/29/22 MRI brain: IMPRESSION: 1. No acute intracranial process. 2. Central pattern of cerebral volume loss and bilateral hippocampal atrophy. 3. Moderate chronic small vessel disease with old infarcts in the right middle frontal gyrus, left putamen, and right cerebellar hemisphere. 4. Sulcal hemosiderin staining along the right superior frontal sulcus, likely sequela of prior subarachnoid hemorrhage.  Repeat MRI  06/25/23 FINDINGS: Brain: Negative for an acute infarct. No acute hemorrhage. No hydrocephalus. No extra-axial fluid collection. No mass effect. No mass lesion. There is a background of moderate chronic microvascular ischemic change. Generalized volume loss without lobar predominance. Unchanged susceptibility artifact in the right frontal lobe, favored to be related to a site of chronic hemorrhage. There is a new microhemorrhage in the right parietal lobe (series 10, image 61). Chronic infarcts in the right frontal lobe, left basal ganglia, and right cerebellum. Generalized volume loss without lobar predominance.  Vascular: Normal flow voids.  Skull and upper cervical spine: Normal marrow signal.  Sinuses/Orbits: No middle ear or mastoid effusion. Paranasal sinuses are clear. Bilateral lens replacement. Orbits are otherwise unremarkable. IMPRESSION: 1. No acute intracranial abnormality. 2. Chronic infarcts in the right frontal lobe, left basal ganglia, and right cerebellum.  REVIEWED NEURO NOTE 10/16/22; dx mild dementia, mild-mod sm vessel ischemic dz + infarcts. And started Memantine  10 BID.  Agreed.  Continue donepezil  10 mg daily  Follow-up 6 mos  Elenor Macintosh, MD, DFAPA  Please see After Visit Summary for patient specific instructions.   Lorene Macintosh, MD,  DFAPA   Future Appointments  Date Time Provider Department Center  06/16/2024  4:00 PM Cottle, Lorene KANDICE Raddle., MD [facility] None        Orders Placed This Encounter  Procedures   Lithium  level   Basic metabolic panel   TSH    -------------------------------

## 2023-12-15 NOTE — Patient Instructions (Signed)
  [facility] [address] [employer] [address]  [phone]

## 2024-02-04 LAB — BASIC METABOLIC PANEL WITH GFR
BUN/Creatinine Ratio: 15 (ref 10–24)
BUN: 19 mg/dL (ref 8–27)
CO2: 22 mmol/L (ref 20–29)
Calcium: 8.9 mg/dL (ref 8.6–10.2)
Chloride: 105 mmol/L (ref 96–106)
Creatinine, Ser: 1.27 mg/dL (ref 0.76–1.27)
Glucose: 82 mg/dL (ref 70–99)
Potassium: 4.4 mmol/L (ref 3.5–5.2)
Sodium: 143 mmol/L (ref 134–144)
eGFR: 57 mL/min/1.73 — ABNORMAL LOW (ref >59)

## 2024-02-04 LAB — LITHIUM LEVEL: Lithium Lvl: 0.3 mmol/L — ABNORMAL LOW (ref 0.5–1.2)

## 2024-02-04 LAB — TSH: TSH: 2.24 u[IU]/mL (ref 0.450–4.500)

## 2024-02-12 ENCOUNTER — Ambulatory Visit: Payer: Self-pay | Admitting: Psychiatry

## 2024-02-19 ENCOUNTER — Ambulatory Visit: Admitting: Psychiatry

## 2024-02-22 ENCOUNTER — Other Ambulatory Visit: Payer: Self-pay | Admitting: Psychiatry

## 2024-02-22 DIAGNOSIS — F331 Major depressive disorder, recurrent, moderate: Secondary | ICD-10-CM

## 2024-02-22 DIAGNOSIS — F411 Generalized anxiety disorder: Secondary | ICD-10-CM

## 2024-05-20 DIAGNOSIS — D539 Nutritional anemia, unspecified: Secondary | ICD-10-CM | POA: Diagnosis not present

## 2024-05-20 DIAGNOSIS — I10 Essential (primary) hypertension: Secondary | ICD-10-CM | POA: Diagnosis not present

## 2024-05-20 DIAGNOSIS — E039 Hypothyroidism, unspecified: Secondary | ICD-10-CM | POA: Diagnosis not present

## 2024-05-20 DIAGNOSIS — N183 Chronic kidney disease, stage 3 unspecified: Secondary | ICD-10-CM | POA: Diagnosis not present

## 2024-05-20 DIAGNOSIS — G3184 Mild cognitive impairment, so stated: Secondary | ICD-10-CM | POA: Diagnosis not present

## 2024-05-26 ENCOUNTER — Telehealth: Payer: Self-pay | Admitting: Psychiatry

## 2024-05-26 DIAGNOSIS — F331 Major depressive disorder, recurrent, moderate: Secondary | ICD-10-CM

## 2024-05-26 DIAGNOSIS — F411 Generalized anxiety disorder: Secondary | ICD-10-CM

## 2024-05-26 MED ORDER — VILAZODONE HCL 10 MG PO TABS: 10.0000 mg | ORAL_TABLET | Freq: Every day | ORAL | 0 refills | Status: AC

## 2024-05-26 NOTE — Telephone Encounter (Signed)
 Pt wife called stating the new pharmacy still doesn't have his script for Vilazodone 

## 2024-05-26 NOTE — Telephone Encounter (Signed)
 Rx sent.

## 2024-05-26 NOTE — Telephone Encounter (Signed)
 Patient's wife lvm at 11:38 requesting refill Vilazodone  10mg . States that have recently moved and need prescription sent to [facility] [address] [place], [place] Ph: [phone] Appt 1/7 Ph: [phone]

## 2024-05-27 ENCOUNTER — Telehealth: Payer: Self-pay | Admitting: Pharmacist

## 2024-05-27 NOTE — Progress Notes (Signed)
° °  05/27/2024  Patient ID: Jake Collins, male   DOB: 07-24-1945, 78 y.o.   MRN: [id number]  Received request from Dr. Vernon to assist in getting [place] coverage for the patient. Appears the PA was started by the patient and submitted with incorrect or missing information, so it was rejected.   Tried to submit appeal on CoverMyMeds but said that it was a duplicate. Called the PA department and said it was already declined due to lack of information provided and not submitted by a specialist. Transferred to appeal's department and submitted the appeal initiation request. Said it can take up to 7 days to decide on this and will reach out to the office regarding additional information needed.  Called and updated the patient regarding Rep[employer] appeal. Thanked for the assistance and aware of the 7 business days turnaround.    Aloysius Lewis, PharmD, [facility] Phone Number: [phone]

## 2024-06-09 ENCOUNTER — Telehealth: Payer: Self-pay | Admitting: Pharmacist

## 2024-06-09 NOTE — Progress Notes (Signed)
" ° °  06/09/2024  Patient ID: Jake Collins, male   DOB: 1945-09-14, 78 y.o.   MRN: [id number]  Called the appeal department regarding [place] coverage. [place] the patient was approved on 12/23 and letter of approval mailed. Approved under appeal reference [id number].  Called the pharmacy to reprocess PA. Said it was approved at [facility] and will be filled later today for pick-up. Called and notified patient's wife. Thanked for the assistance.     Aloysius Lewis, PharmD, [facility] Phone Number: [phone]  "

## 2024-06-16 ENCOUNTER — Encounter: Payer: Self-pay | Admitting: Psychiatry

## 2024-06-16 ENCOUNTER — Telehealth (INDEPENDENT_AMBULATORY_CARE_PROVIDER_SITE_OTHER): Admitting: Psychiatry

## 2024-06-16 ENCOUNTER — Ambulatory Visit: Admitting: Psychiatry

## 2024-06-16 DIAGNOSIS — F03A0 Unspecified dementia, mild, without behavioral disturbance, psychotic disturbance, mood disturbance, and anxiety: Secondary | ICD-10-CM | POA: Diagnosis not present

## 2024-06-16 DIAGNOSIS — F331 Major depressive disorder, recurrent, moderate: Secondary | ICD-10-CM | POA: Diagnosis not present

## 2024-06-16 DIAGNOSIS — Z79899 Other long term (current) drug therapy: Secondary | ICD-10-CM

## 2024-06-16 DIAGNOSIS — F411 Generalized anxiety disorder: Secondary | ICD-10-CM

## 2024-06-16 MED ORDER — MEMANTINE HCL 5 MG PO TABS: 5.0000 mg | ORAL_TABLET | Freq: Two times a day (BID) | ORAL | 1 refills | Status: AC

## 2024-06-16 NOTE — Progress Notes (Signed)
 Jake Collins [id number] 29-Oct-1945 79 y.o.  Video Visit via My Chart  I connected with pt by video using My Chart and verified that I am speaking with the correct person using two identifiers.   I discussed the limitations, risks, security and privacy concerns of performing an evaluation and management service by My Chart  and the availability of in person appointments. I also discussed with the patient that there may be a patient responsible charge related to this service. The patient expressed understanding and agreed to proceed.  I discussed the assessment and treatment plan with the patient. The patient was provided an opportunity to ask questions and all were answered. The patient agreed with the plan and demonstrated an understanding of the instructions.   The patient was advised to call back or seek an in-person evaluation if the symptoms worsen or if the condition fails to improve as anticipated.  I provided 45 minutes of video time during this encounter.  The patient was located at home and the provider was located office. Session [phone] pm  Subjective:   Patient ID:  Jake Collins is a 79 y.o. (DOB August 07, 1945) male.  Chief Complaint:  Chief Complaint  Patient presents with   Follow-up   Depression   Medication Reaction   Memory Loss     LEWIS KEATS presents to the office today for follow-up of major depression and GAD.  seen June 2020 and was doing well and no meds were changed.  The only psychiatric med for the last few years has been venlafaxine  50 mg daily.  12/07/2019 appointment with the following noted: Remained well.   Concerned about venlafaxine  sexual SE.  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 Neither he nor wife notced change in mood anxiety since that time.   Has plenty of money and no longer interested in the money.   Wife is concerned he's moody.   Gym 3 times weekly. Plan: Cont  med long term DT high relapse risk.  Has a long history of depression and anxiety.  Has had multiple relapses before.  Been under my care since 2006.  He's reduced venlafaxine  to 25 mg daily and still complains of sexual SE but wife complains he's moody. Option lamotrigine, lithium  alone, Viibryd . Lithium  150 mg daily for mood and wean off venlafaxine   By 12.5 mg daily for 3 weeks and stop it DT sexual SE.  02/10/20 appt with the following noted: No problems off venlafaxine .  No depression.  Sexual SE a little better.  Moodiness is better. Started melatonin and helped sleep.  No SE lithium . Not more anxious.    02/09/21 appt with following noted:  No depression.   Patient denies any recent difficulty with anxiety.  Patient denies difficulty with sleep initiation or maintenance. Denies appetite disturbance.  Patient reports that energy and motivation have been good.  Patient denies any difficulty with concentration.  Patient denies any suicidal ideation. Plan: Lithium  150 mg daily for mood and no problems off of the venlafaxine ..   10/10/21 appt noted: Wife not happy with me.  She's had 4th and 5th back surgery.  Does all kinds of things for him.  She says he's moodier than normal.  Money is not a problem. More short tempered than he used to be.  Can't play golf bc of knees.  Will need TKR in the next few mos.  Still goes to gym 3 times  weekly.  Would like to get back to golf bc no hobbies. PLAN: To avoid sexual SE will pick Trintellix or Viibryd .   Viibryd  10 mg daily. Lithium  150 mg daily for mood and no problems off of the venlafaxine ..  11/29/21 appt noted: No effect Viibryd  good or bad.  Takes with breakfast. No SE Would like more benefit.   Sleep good.  Lower appetite but wt ok. Plan: Increase Viibryd  to  20 mg daily for depression. Lithium  150 mg daily for mood   02/14/22 appt noted: Some additional benefit mood is better and wife agrees. Never been a big talker but is talking more with  her.  Married 59 years.  Doesn't feel depressed.  Less irritable with it.   Going on vacation with family.  No GI px with meds. Taking Pepcid .  Viibryd  20 and lithium  150 mg daily. Sexual SE ED.  Can put up with it if it helps. Plan: Better with Viibryd  to  20 mg daily for depression. Lithium  150 mg daily for mood   06/20/22 appt noted: Got confused about a change in generic.   Mood is good and pleased with response to Viibryd . Disc low dose lithium . Doing well doc. Plan no med changes  07/17/22 TC from wife reporting H with TKR and then complications and hosp and has been more confused.  Last hosp 07/11/22.   08/15/22 TC:  Wife reports that husband is in bad shape. Any time he deals with stress gets this way. Feels it is his mental condition that is causing problems, not dementia. He doesn't know the grandkids names and can't remember things. She says in his mind  he is fine.    Was put on cancellation list.  08/20/22 appt noted:  seen urgently per wife, Alfrieda, and with her Still on viibryd  20 & lithium  150 daily. I have been a pain in the Butt to live with.  Mainly forgetful since surgery.  Needs repetition.  Trouble with knee.  Doesn't feel right yet.  Only Tylenol  for pain.  Times of feeling confused.  Not really depressed but maybe a little. Good appetite.  Sometimes sleeps too much.   W sees confusion, GI bleeding and U incontinence since surgery.   W says after surgery 07/08/22 had not been given his usual meds and he was having panic.  Couple days later UGI bleed and hosp and was confused in hospital.   Per wife this is 4th time in 60 years she's seen a change.  Less involved and less watching golf.  PT noticed he was out of it and was more confused not knowing gkids names. Wife says sleeping 12-15 hours and then some in day.  Was worried about this appt and got dressed at 5 and kept asking if it was time to go to the hospital.   Had lost 15 # thinks he gained some back.   TSH  checked about 3 mos ago.  08/30/22 appt noted:  seen with wife Got confused about a doctor's appt.  Wife thinks when low BP he gets more confused.  Has stopped HCTZ. She still feels he's not himself.  A little memory problems before surgery but 50% worse now.   Still knee pain. Family notices he's not himself.   PCP Addie Glatter will retire. [facility]. W says past history of paranoid delusions for mos thinking they were going to freeze to death abut not delusional now.   Sleep ok without napping. Wants to drive  car again.  09/13/22 appt noted:  seen with wife Had to drive to hospital bc wife had to go and he did ok. Feel better.  W said he's having good days and bad days.  Anger really bugs me. Wife reduced his Viibryd   from 20 to 10 mg daily bc was concerned it might be making him angry. Sleep like a log.   He has trouble remembering pills without help of wife.   Dr. Merilee checking labs upcoming Monday W doesn't know why he's having such memory problems after surgery like trouble finding keys.    10/11/22 appt noted:  with wife Alfrieda Denies dep.  Wife agrees he's got more interest in other things and is better than he was.   It was a bad hole I was in after that operation.  Wife points out he couldn't say that before but is better now. She says this is first week without doc's appts.   No anxiety or fear. Sleep good.   She says he still has problems with meds but is cognitively better. Seems less antsy with Viibryd  10 vs 20 mg daily. No problms walking.  Memory is still behind what it does.   Good appetite.   Remote history of afib.    12/19/22 appt noted: He has no concerns.  Feels like his dep is under control. She thinks may be Memantine  helped with mood some but can still be irritable and angry at times.   Tripped twice.   No NVD.  No new health concerns. Sleep is good.   Per wife come a long way from 07/08/22 Psych med viibryd  10, lithium  150 daily without  SE Plan: Reduced Viibryd  10 mg daily for depression. Will increase Lithium  slightly to 1/2 of 450mg  daily for irritable mood and low doses have neuroprotective effect Start memantine  10 BID  08/14/23 appt noted:  seen with wife.  Med: Viibryd  10, lithium  ER 225, memantine  10 BID, neuro Dr. Margaret added donepezil  5 mg daily. Read about increasing donepezil .   Dr. SHAUNNA will not see people after the dx.   No sig dep nor anxiety .   No SE or med concerns.   Memory loss is progressing.  Looking at moving to [facility] to be near son who has early retirement.  Hard giving up a house you built and not sure about it.  Not sure whether they will do it.   Plan increase donepezil  to 10 per usual dose  12/15/23 appt noted:  Med: Viibryd  10, memantine  10 BID, donepezil  10 daily, lithium  ER 225 mg daily, melatonin HS.   Doing good.  Not depressed but had a bad time the other day per wife.  He argues more per wife, meaner than ever. No SE or med concerns.  He's says sleeping fine.  Sometimes trouble going to sleep.  Used to go to bed 930 all life but has to stay up later.   Sleeps until 915. Plenty of sleep bc sleeps late.   No sig tremors. No D, N.  Moving to [place] and concerned about doctors.  Moving after 02/10/24.    Downsizing some.  Son in [place].   06/16/24 appt noted:  virtual  with son on call, Marty Med: Viibryd  10, memantine  10 BID, donepezil  10 daily, lithium  ER 225 mg daily, melatonin HS.   Much better on dep.  Wife says he doesn't do much but watch TV.  He says  Son notes card work up and they would like  him off lithium  to use a drug that would conflict with lithium .   Son wants him to stop AD bc he thinks it is making him zoned out.  They want to see him off it.   Son feels he's not reactive enough emotionally and blames psych meds for this. Son's wife is pharmacist and concerns about polypharmacy.  Also bc of Cr worsening to 1.8.  Son says they will contact us  if he starts todecline.  Mini-Mental     Flowsheet Row Office Visit from 08/14/2023 in [facility] Office Visit from 10/16/2022 in [facility]  Total Score (max 30 points ) 23 22   Flowsheet Row ED to Hosp-Admission (Discharged) from 01/08/2023 in [facility] to Hosp-Admission (Discharged) from 07/11/2022 in [place] [facility] Admission (Discharged) from 07/08/2022 in [place] [facility]  C-SSRS RISK CATEGORY No Risk No Risk No Risk    Mini-Mental    Flowsheet Row Office Visit from 08/14/2023 in [facility] Office Visit from 10/16/2022 in [facility]  Total Score (max 30 points ) 23 22   Flowsheet Row ED to Hosp-Admission (Discharged) from 01/08/2023 in [place] [facility] [place] to Hosp-Admission (Discharged) from 07/11/2022 in [place] [facility] Admission (Discharged) from 07/08/2022 in [place] [facility]  C-SSRS RISK CATEGORY No Risk No Risk No Risk      Past Psychiatric Medication Trials:   Lithium , Lexapro, Zoloft, Effexor  XR 150 sexual SE,  Klonopin   Viibryd  20  M history psych hosp and ECT.  F neglected him. M and B, uncle,  schizophrenic.  D psych tx.  PCP Dr. Merilee , [place]  Review of Systems:  Review of Systems  Cardiovascular:  Negative for chest pain and palpitations.  Gastrointestinal:  Negative for diarrhea and nausea.  Genitourinary:  Positive for urgency.       ED  Musculoskeletal:  Positive for arthralgias.  Neurological:  Negative for tremors.  Psychiatric/Behavioral:  Negative for dysphoric mood. The patient is not nervous/anxious.     Medications: I have reviewed the patient's current medications.  Current Outpatient Medications  Medication Sig Dispense Refill   donepezil  (ARICEPT ) 10 MG tablet Take 1 tablet (10 mg total) by mouth at bedtime. 90 tablet 3    levothyroxine  (SYNTHROID , LEVOTHROID) 100 MCG tablet Take 100 mcg by mouth daily.       Melatonin 5 MG CHEW Chew 10 mg by mouth at bedtime. Gummy tabs     nebivolol  (BYSTOLIC ) 5 MG tablet Take 5 mg by mouth daily.     pantoprazole  (PROTONIX ) 40 MG tablet Take 40 mg by mouth 2 (two) times daily.     Vilazodone  HCl (VIIBRYD ) 10 MG TABS Take 1 tablet (10 mg total) by mouth daily. 90 tablet 0   acetaminophen  (TYLENOL ) 500 MG tablet Take 2 tablets (1,000 mg total) by mouth every 8 (eight) hours as needed. (Patient taking differently: Take 1,000 mg by mouth every 8 (eight) hours as needed for moderate pain (pain score 4-6).) 30 tablet 0   glucosamine-chondroitin 500-400 MG tablet Take 1 tablet by mouth daily.     latanoprost  (XALATAN ) 0.005 % ophthalmic solution Place 1 drop into both eyes at bedtime.     loratadine  (CLARITIN ) 10 MG tablet Take 10 mg by mouth daily as needed for allergies.     memantine  (NAMENDA ) 5 MG tablet  Take 1 tablet (5 mg total) by mouth 2 (two) times daily. 180 tablet 1   REPATHA SURECLICK 140 MG/ML SOAJ Inject 140 mg into the skin every 14 (fourteen) days. Every other Saturday     silodosin  (RAPAFLO ) 8 MG CAPS capsule Take 1 capsule (8 mg total) by mouth daily with breakfast. 30 capsule 2   No current facility-administered medications for this visit.    Medication Side Effects: sexual SE  Allergies:  Allergies  Allergen Reactions   Atorvastatin     Other Reaction(s): Aches and memory   Rosuvastatin      Other Reaction(s): Aches and memory   Sulfa Antibiotics     Elspeth Louder syndrome     Past Medical History:  Diagnosis Date   Arthritis    Cancer (HCC)    skin melanoma   Depression    Diverticulitis    2 bouts in 10 years   Hyperlipemia    Hypertension    Hypothyroid    Scoliosis    Thyroid  disease     Family History  Problem Relation Age of Onset   Heart disease Father     Social History   Socioeconomic History   Marital status: Married     Spouse name: Not on file   Number of children: Not on file   Years of education: Not on file   Highest education level: Not on file  Occupational History   Not on file  Tobacco Use   Smoking status: Never   Smokeless tobacco: Never  Vaping Use   Vaping status: Never Used  Substance and Sexual Activity   Alcohol use: No   Drug use: No   Sexual activity: Not Currently  Other Topics Concern   Not on file  Social History Narrative   Not on file   Social Drivers of Health   Tobacco Use: Low Risk (06/16/2024)   Patient History    Smoking Tobacco Use: Never    Smokeless Tobacco Use: Never    Passive Exposure: Not on file  Financial Resource Strain: Not on file  Food Insecurity: Low Risk (02/24/2024)   Received from [facility]   Epic    Within the past 12 months, you worried that your food would run out before you got money to buy more: Never true    Within the past 12 months, the food you bought just didn't last and you didn't have money to get more. : Never true  Transportation Needs: No Transportation Needs (02/24/2024)   Received from [facility]    In the past 12 months, has lack of reliable transportation kept you from medical appointments, meetings, work or from getting things needed for daily living? : No  Physical Activity: Not on file  Stress: Not on file  Social Connections: Not on file  Intimate Partner Violence: Not At Risk (01/09/2023)   Humiliation, Afraid, Rape, and Kick questionnaire    Fear of Current or Ex-Partner: No    Emotionally Abused: No    Physically Abused: No    Sexually Abused: No  Depression (PHQ2-9): Not on file  Alcohol Screen: Not on file  Housing: Unknown (06/14/2024)   Received from [facility]   Epic    Unable to Pay for Housing in the Last Year: Not on file    Number of Times Moved in the Last Year: Not on file    At any time in the past 12 months, were you homeless or living  in a shelter (including  now)?: No  Utilities: Low Risk (02/24/2024)   Received from [facility]   Utilities    In the past 12 months has the electric, gas, oil, or water company threatened to shut off services in your home? : No  Health Literacy: Not on file    Past Medical History, Surgical history, Social history, and Family history were reviewed and updated as appropriate.   Please see review of systems for further details on the patient's review from today.   Objective:   Physical Exam:  There were no vitals taken for this visit.  Physical Exam Constitutional:      General: He is not in acute distress.    Appearance: He is well-developed.  Musculoskeletal:        General: No deformity.  Neurological:     Mental Status: He is alert and oriented to person, place, and time.     Coordination: Coordination normal.  Psychiatric:        Attention and Perception: Attention and perception normal. He does not perceive auditory or visual hallucinations.        Mood and Affect: Mood is not anxious or depressed. Affect is not labile, blunt or inappropriate.        Speech: Speech normal.        Behavior: Behavior normal.        Thought Content: Thought content normal. Thought content is not delusional. Thought content does not include homicidal or suicidal ideation. Thought content does not include suicidal plan.        Cognition and Memory: Cognition is not impaired. He exhibits impaired recent memory.        Judgment: Judgment normal.     Comments: Insight intact. No delusions.  Mild forgetfulness but not severe. Pleasant Has reasonable reasoning ability given memory losse. MMSE 23/30 and May 2024 was 22/30      Lab Review:     Component Value Date/Time   NA 143 02/03/2024 1133   K 4.4 02/03/2024 1133   CL 105 02/03/2024 1133   CO2 22 02/03/2024 1133   GLUCOSE 82 02/03/2024 1133   GLUCOSE 111 (H) 01/09/2023 0249   BUN 19 02/03/2024 1133   CREATININE 1.27 02/03/2024 1133   CALCIUM  8.9  02/03/2024 1133   PROT 6.6 01/08/2023 2017   ALBUMIN 3.7 01/08/2023 2017   AST 28 01/08/2023 2017   ALT 20 01/08/2023 2017   ALKPHOS 66 01/08/2023 2017   BILITOT 0.5 01/08/2023 2017   GFRNONAA 56 (L) 01/09/2023 0249   GFRAA >60 04/15/2019 0308       Component Value Date/Time   WBC 13.1 (H) 01/09/2023 0249   RBC 4.79 01/09/2023 0249   HGB 14.4 01/09/2023 0249   HCT 43.8 01/09/2023 0249   PLT 215 01/09/2023 0249   MCV 91.4 01/09/2023 0249   MCH 30.1 01/09/2023 0249   MCHC 32.9 01/09/2023 0249   RDW 14.7 01/09/2023 0249   LYMPHSABS 1.5 01/08/2023 2017   MONOABS 0.8 01/08/2023 2017   EOSABS 0.2 01/08/2023 2017   BASOSABS 0.1 01/08/2023 2017    Lithium  Lvl  Date Value Ref Range Status  02/03/2024 0.3 (L) 0.5 - 1.2 mmol/L Final    Comment:    A concentration of 0.5-0.8 mmol/L is advised for long-term use; concentrations of up to 1.2 mmol/L may be necessary during acute treatment.  Detection Limit = 0.1                           <0.1 indicates None Detected    02/03/24 lithium  0.3 on ER 225 mg daily 07/13/22 lithium  level not detectable on 150 mg HS.  ? Compliance.  09/29/22 MRI brain: IMPRESSION: 1. No acute intracranial process. 2. Central pattern of cerebral volume loss and bilateral hippocampal atrophy. 3. Moderate chronic small vessel disease with old infarcts in the right middle frontal gyrus, left putamen, and right cerebellar hemisphere. 4. Sulcal hemosiderin staining along the right superior frontal sulcus, likely sequela of prior subarachnoid hemorrhage.    .res Assessment: Plan:    Regginald Pask was seen today for follow-up, depression, medication reaction and memory loss.  Diagnoses and all orders for this visit:  Major depressive disorder, recurrent episode, moderate (HCC)  Generalized anxiety disorder  Mild dementia, unspecified dementia type, with likely vascular and Alz components (HCC) -     memantine  (NAMENDA ) 5 MG  tablet; Take 1 tablet (5 mg total) by mouth 2 (two) times daily.  Lithium  use   45 min video face to face time with patient .  Seen with wife. And his son.    Overall dementia appears pretty stable over the last year.  Still good in conversation and reasoning ability except where memory required.  Some denial.  It is my opinion that given pt's long history of good response with psych meds without blunting effects it is much more likely his blunting is due to strokes and not meds.  Disc this with his son but they want to wean off meds despite risks.  Disc safest ways to do so.   Disc that if he relapses, his depression may not respond to the same meds and dosages that have worked in the past.  We discussed rec continue med long term DT high relapse risk.  Has a long history of depression and anxiety.   Has had multiple relapses before.  Been under my care since 2006.  Hx relapse without lithium   Viibryd  10 mg daily for depression.  Doing well with this.  Disc risk relapse off this.  Per son's request with pt's approval, will wean of meds.  But this is AMA bc history suggests pt has much greater risk of harm than benefit in doing so bc multiple relapses of depression throughout his adult life including specifically when stopping the lithium .  Lithium   DC pe r son's request bc declining kidney function with CR 1.8.  concerns about his kidney function and lithium  are legitimate.  Disc again this failed.  In the past. Check lithium  level and labs  09/29/22 MRI brain: IMPRESSION: 1. No acute intracranial process. 2. Central pattern of cerebral volume loss and bilateral hippocampal atrophy. 3. Moderate chronic small vessel disease with old infarcts in the right middle frontal gyrus, left putamen, and right cerebellar hemisphere. 4. Sulcal hemosiderin staining along the right superior frontal sulcus, likely sequela of prior subarachnoid hemorrhage.  Repeat MRI  06/25/23 FINDINGS: Brain:  Negative for an acute infarct. No acute hemorrhage. No hydrocephalus. No extra-axial fluid collection. No mass effect. No mass lesion. There is a background of moderate chronic microvascular ischemic change. Generalized volume loss without lobar predominance. Unchanged susceptibility artifact in the right frontal lobe, favored to be related to a site of chronic hemorrhage. There is a new microhemorrhage in the right parietal lobe (series 10, image 61). Chronic infarcts  in the right frontal lobe, left basal ganglia, and right cerebellum. Generalized volume loss without lobar predominance.  Vascular: Normal flow voids.  Skull and upper cervical spine: Normal marrow signal.  Sinuses/Orbits: No middle ear or mastoid effusion. Paranasal sinuses are clear. Bilateral lens replacement. Orbits are otherwise unremarkable. IMPRESSION: 1. No acute intracranial abnormality. 2. Chronic infarcts in the right frontal lobe, left basal ganglia, and right cerebellum.  REVIEWED NEURO NOTE 10/16/22; dx mild dementia, mild-mod sm vessel ischemic dz + infarcts.  Reduce  Memantine  5 BID.  Agreed bc of reduced kidney function  Continue donepezil  10 mg daily  Given pt and family's wishes.  Stop lithium  now.  Wait a month and then reduce Viibryd  to 5 mg daily and continue for 2 mos before stopping .  High risk relapse discussed.  Follow-up April  Elenor Macintosh, MD, DFAPA  Please see After Visit Summary for patient specific instructions.   Lorene Macintosh, MD, DFAPA   No future appointments.       No orders of the defined types were placed in this encounter.   -------------------------------

## 2024-09-21 ENCOUNTER — Telehealth: Admitting: Psychiatry
